# Patient Record
Sex: Female | Born: 1960 | Race: White | Hispanic: No | Marital: Married | Smoking: Former smoker
Health system: Southern US, Community
[De-identification: ages and names within clinical notes are randomized; demographics above are authoritative.]

## PROBLEM LIST (undated history)

## (undated) DIAGNOSIS — E119 Type 2 diabetes mellitus without complications: Secondary | ICD-10-CM

## (undated) DIAGNOSIS — M1990 Unspecified osteoarthritis, unspecified site: Secondary | ICD-10-CM

## (undated) HISTORY — PX: CHOLECYSTECTOMY: SHX55

## (undated) HISTORY — PX: ABDOMINAL HYSTERECTOMY: SHX81

---

## 1997-08-16 ENCOUNTER — Emergency Department (HOSPITAL_COMMUNITY): Admission: EM | Admit: 1997-08-16 | Discharge: 1997-08-16 | Payer: Self-pay | Admitting: Emergency Medicine

## 1998-04-20 ENCOUNTER — Other Ambulatory Visit: Admission: RE | Admit: 1998-04-20 | Discharge: 1998-04-20 | Payer: Self-pay | Admitting: *Deleted

## 1999-01-11 ENCOUNTER — Encounter: Payer: Self-pay | Admitting: Urology

## 1999-01-14 ENCOUNTER — Observation Stay (HOSPITAL_COMMUNITY): Admission: RE | Admit: 1999-01-14 | Discharge: 1999-01-15 | Payer: Self-pay | Admitting: Urology

## 1999-10-18 ENCOUNTER — Encounter: Payer: Self-pay | Admitting: Family Medicine

## 1999-10-18 ENCOUNTER — Encounter: Admission: RE | Admit: 1999-10-18 | Discharge: 1999-10-18 | Payer: Self-pay | Admitting: Gastroenterology

## 1999-10-18 ENCOUNTER — Encounter: Payer: Self-pay | Admitting: Gastroenterology

## 1999-10-18 ENCOUNTER — Encounter: Admission: RE | Admit: 1999-10-18 | Discharge: 1999-10-18 | Payer: Self-pay | Admitting: Family Medicine

## 1999-10-27 ENCOUNTER — Ambulatory Visit (HOSPITAL_COMMUNITY): Admission: RE | Admit: 1999-10-27 | Discharge: 1999-10-27 | Payer: Self-pay | Admitting: Gastroenterology

## 1999-11-22 ENCOUNTER — Encounter: Payer: Self-pay | Admitting: Gastroenterology

## 1999-11-22 ENCOUNTER — Ambulatory Visit (HOSPITAL_COMMUNITY): Admission: RE | Admit: 1999-11-22 | Discharge: 1999-11-22 | Payer: Self-pay | Admitting: Gastroenterology

## 2001-01-08 ENCOUNTER — Encounter: Payer: Self-pay | Admitting: Family Medicine

## 2001-01-08 ENCOUNTER — Encounter: Admission: RE | Admit: 2001-01-08 | Discharge: 2001-01-08 | Payer: Self-pay | Admitting: Family Medicine

## 2001-04-30 ENCOUNTER — Encounter: Admission: RE | Admit: 2001-04-30 | Discharge: 2001-04-30 | Payer: Self-pay | Admitting: Orthopedic Surgery

## 2001-04-30 ENCOUNTER — Encounter: Payer: Self-pay | Admitting: Orthopedic Surgery

## 2001-07-31 ENCOUNTER — Encounter (INDEPENDENT_AMBULATORY_CARE_PROVIDER_SITE_OTHER): Payer: Self-pay | Admitting: Specialist

## 2001-07-31 ENCOUNTER — Observation Stay (HOSPITAL_COMMUNITY): Admission: RE | Admit: 2001-07-31 | Discharge: 2001-08-01 | Payer: Self-pay | Admitting: Surgery

## 2001-07-31 ENCOUNTER — Encounter: Payer: Self-pay | Admitting: Surgery

## 2001-11-25 ENCOUNTER — Ambulatory Visit (HOSPITAL_BASED_OUTPATIENT_CLINIC_OR_DEPARTMENT_OTHER): Admission: RE | Admit: 2001-11-25 | Discharge: 2001-11-25 | Payer: Self-pay | Admitting: Family Medicine

## 2002-02-11 ENCOUNTER — Encounter: Admission: RE | Admit: 2002-02-11 | Discharge: 2002-02-11 | Payer: Self-pay | Admitting: Orthopedic Surgery

## 2002-02-11 ENCOUNTER — Encounter: Payer: Self-pay | Admitting: Orthopedic Surgery

## 2002-03-04 ENCOUNTER — Encounter: Payer: Self-pay | Admitting: Family Medicine

## 2002-03-04 ENCOUNTER — Encounter: Admission: RE | Admit: 2002-03-04 | Discharge: 2002-03-04 | Payer: Self-pay | Admitting: Family Medicine

## 2002-07-19 ENCOUNTER — Encounter: Payer: Self-pay | Admitting: Emergency Medicine

## 2002-07-19 ENCOUNTER — Emergency Department (HOSPITAL_COMMUNITY): Admission: EM | Admit: 2002-07-19 | Discharge: 2002-07-19 | Payer: Self-pay | Admitting: Emergency Medicine

## 2004-06-14 ENCOUNTER — Other Ambulatory Visit: Admission: RE | Admit: 2004-06-14 | Discharge: 2004-06-14 | Payer: Self-pay | Admitting: Gynecology

## 2005-08-02 ENCOUNTER — Encounter: Admission: RE | Admit: 2005-08-02 | Discharge: 2005-08-02 | Payer: Self-pay | Admitting: Family Medicine

## 2006-01-11 ENCOUNTER — Encounter (INDEPENDENT_AMBULATORY_CARE_PROVIDER_SITE_OTHER): Payer: Self-pay | Admitting: Specialist

## 2006-01-11 ENCOUNTER — Ambulatory Visit (HOSPITAL_COMMUNITY): Admission: RE | Admit: 2006-01-11 | Discharge: 2006-01-11 | Payer: Self-pay | Admitting: Gastroenterology

## 2006-05-09 ENCOUNTER — Other Ambulatory Visit: Admission: RE | Admit: 2006-05-09 | Discharge: 2006-05-09 | Payer: Self-pay | Admitting: Obstetrics and Gynecology

## 2007-03-19 ENCOUNTER — Encounter: Admission: RE | Admit: 2007-03-19 | Discharge: 2007-03-19 | Payer: Self-pay | Admitting: Obstetrics and Gynecology

## 2007-09-04 ENCOUNTER — Other Ambulatory Visit: Admission: RE | Admit: 2007-09-04 | Discharge: 2007-09-04 | Payer: Self-pay | Admitting: Obstetrics and Gynecology

## 2008-04-30 ENCOUNTER — Encounter: Admission: RE | Admit: 2008-04-30 | Discharge: 2008-04-30 | Payer: Self-pay | Admitting: Obstetrics and Gynecology

## 2008-08-06 ENCOUNTER — Encounter: Admission: RE | Admit: 2008-08-06 | Discharge: 2008-08-06 | Payer: Self-pay | Admitting: Family Medicine

## 2010-09-17 NOTE — Op Note (Signed)
[facility]  Patient:    MINEOLA, DUAN Visit Number: [id number] MRN: [id number]          Service Type: Attending:  Abigail Miyamoto, M.D. Dictated by:   Abigail Miyamoto, M.D. Proc. Date: 07/31/01                             Operative Report  PREOPERATIVE DIAGNOSIS:  Abdominal pain, biliary dyskinesia.  POSTOPERATIVE DIAGNOSIS:  Abdominal pain, biliary dyskinesia.  OPERATION PERFORMED:  Laparoscopic cholecystectomy.  ASSISTANT:  Adolph Pollack, M.D.  SURGEON:  Abigail Miyamoto, M.D.  ANESTHESIA:  General endotracheal anesthesia.  ESTIMATED BLOOD LOSS:  Minimal.  DESCRIPTION OF PROCEDURE:  Patient brought to operating room and identified as Vonna Kotyk.  She was placed supine on the operating table and general anesthesia was induced.  Next, Dr. Annabell Howells performed his pelvic procedure.  The patient was then placed in supine position.  Her abdomen was then prepped and draped in the usual sterile fashion.  Using a #15 blade, a small transverse incision was made below the umbilicus.  Incision was carried down to the fascia which was then opened with a scalpel.  A hemostat was then used to pass into the peritoneal cavity.  A 0 Vicryl pursestring suture was then placed around the fascial opening.  The Hasson port was placed through the opening and insufflation of the abdomen was begun.  An 11 mm port was placed in the patients epigastrium under direct vision.  The patient was found to have several adhesive bands from the anterior surface of the liver to the abdominal wall.  These were taken down with the scissors.  Next two 5 mm ports were placed in the patients right flank under direct vision.  The gallbladder was then identified, grasped and retracted above the liver bed.  Dissection was then carried out at the base of the gallbladder.  The cystic duct was dissected out and clipped three times proximally and twice distally and transected with the  scissors.  The cystic artery was then identified and clipped twice proximally, once distally and transected as well.  The gallbladder was then dissected free from the liver bed with the electrocautery.  The gallbladder was entered during the dissection and bile leaked from the gallbladder itself.  Once this was completely excised from the liver bed it was placed in an endosac.  Hemostasis was easily achieved in the liver bed.  The gallbladder was then removed through the port at the umbilicus.  A 0 Vicryl at the umbilicus was then tied in place closing the fascial defect.  The abdomen was then copiously irrigated with normal saline. Hemostasis was achieved.  All ports were then removed under direct vision. The abdomen was deflated.  All incisions were then anesthetized with 0.25% Marcaine and then closed with 4-0 Monocryl subcuticular sutures. Steri-Strips, gauze and tape were then applied.  The patient tolerated the procedure well.  All sponge, needle and instrument counts were correct at the end of the procedure.  The patient was then extubated in the operating room and taken in stable condition to the recovery room. Dictated by:   Abigail Miyamoto, M.D. Attending:  Abigail Miyamoto, M.D. DD:  07/31/01 TD:  08/01/01 Job: [id number]

## 2010-09-17 NOTE — Procedures (Signed)
[place]  Patient:    Alejandra [name], Alejandra [name]                      MRN: [id number] Proc. Date: 10/27/99 Adm. Date:  [id number] Attending:  Charna Elizabeth CC:         Lupe Carney, M.D.                           Procedure Report  DATE OF BIRTH:  [birth year]  REFERRING PHYSICIAN:  Lupe Carney, M.D.  PROCEDURE PERFORMED:  Colonoscopy.  ENDOSCOPIST:  Anselmo Rod, M.D.  INSTRUMENT USED:  Olympus video colonoscope.  INDICATIONS:  A 50 year old white female with guaiac positive stool.  Rule out colonic polyps, masses, hemorrhoids, etc.  PREPROCEDURE PREPARATION:  Informed consent was procured from the patient. The patient was fasted for 8 hours prior to the procedure and prepped with a bottle of magnesium citrate and a gallon of NuLytely the night prior to the procedure.  PREPROCEDURE PHYSICAL:  Patient has stable vital signs.  NECK: Supple.  CHEST:  Clear to auscultation. S1, S2 regular.  ABDOMEN:  Soft with normal abdominal bowel sounds.  DESCRIPTION OF PROCEDURE:  The patient was placed in left lateral decubitus position.  No additional sedation was used.  Once the patient was adequately positioned, the Olympus video colonoscope was advanced from the rectum to the cecum without difficulty.  Except for small internal hemorrhoid, no other abnormalities were seen.  The procedure was completed up to the cecum.  The appendiceal orifice and the ileocecal valve were clearly visualized.  No masses, polyps, erosions, ulcerations or diverticula were present.  IMPRESSION:  Normal colonoscopy except for small, nonbleeding internal hemorrhoid.  RECOMMENDATIONS: 1. The patient has been advised to increase the fluid and fiber in her diet. 2. Repeat guaiac testing will be done on an outpatient basis and further    recommendations made as needed. 3. A high fiber diet will be discussed ______  on her next office visit and    she will be followed up  closely. DD:  10/27/99 TD:  10/28/99 Job: [id number]

## 2010-09-17 NOTE — Procedures (Signed)
[place]. [facility]  Patient:    Alejandra [name], Alejandra [name]                      MRN: [id number] Proc. Date: 10/27/99 Adm. Date:  [id number] Attending:  Charna Elizabeth CC:         Lupe Carney, M.D.                           Procedure Report  DATE OF BIRTH:  05/01/1961  REFERRING PHYSICIAN:  Lupe Carney, M.D.  PROCEDURE PERFORMED:  Esophagogastroduodenoscopy.  ENDOSCOPIST:  Anselmo Rod, M.D.  INSTRUMENT USED:  Olympus video panendoscope.  INDICATIONS:  Guaiac positive stool and epigastric pain in a 50 year old female.  Rule out peptic ulcer disease, esophagitis, gastritis, etc.  PREPROCEDURE PREPARATION:  Informed consent was procured from the patient. The patient was fasted for 8 hours prior to the procedure.  PREPROCEDURE PHYSICAL:  Patient has stable vital signs.  NECK:  Supple.  CHEST:  Clear to auscultation. S1, S2 regular.  ABDOMEN:  Soft with normal abdominal bowel sounds.  DESCRIPTION OF PROCEDURE:  The patient was placed in left lateral decubitus position and sedated with 80 mg of Demerol and 8 mg of Versed intravenously. Once the patient was adequately sedated and maintained on low-flow oxygen and continuous cardiac monitoring, the Olympus video panendoscope was advanced through the mouth piece, over the tongue into the esophagus under direct vision.  The entire esophagus appeared normal without evidence of rings, strictures, masses, lesions or esophagitis.  The scope was then advanced into the stomach.  The entire gastric mucosa and the proximal small bowel to 60 cm appeared normal.  There was no outlet obstruction.  IMPRESSION:  Normal esophagogastroduodenoscopy.  RECOMMENDATIONS: 1. Proceed with colonoscopy at this time. 2. Outpatient follow-up for possible HIDA scan with CCK injection. DD:  10/27/99 TD:  10/28/99 Job: [id number]

## 2010-09-17 NOTE — Op Note (Signed)
[facility]  Patient:    Alejandra [name], Alejandra [name]. Visit Number: [id number] MRN: [id number]          Service Type: Attending:  Excell Seltzer. Annabell Howells, M.D. Dictated by:   Excell Seltzer. Annabell Howells, M.D. Proc. Date: 07/31/01   CC:         Alejandra [name], M.D.  Alejandra [name], M.D. [facility]   Operative Report  PROCEDURE:  Removal of retained suprapubic stitch and SPARC sling.  PREOPERATIVE DIAGNOSES:  Recurrent stress incontinence and retained suprapubic stitch.  POSTOPERATIVE DIAGNOSES:  Recurrent stress incontinence and retained suprapubic stitch.  SURGEON:  Excell Seltzer. Annabell Howells, M.D.  ANESTHESIA:  General.  DRAINS:  Foley.  COMPLICATIONS:  None.  INDICATIONS FOR PROCEDURE:  Alejandra [name] is a 50 year old white female who is several years out from AP repair and sling. She has had some recurrent stress incontinence that has gotten progressively worse and has elected to undergo a SPARC sling for treatment of recurrent stress incontinence. She also has retained suprapubic stitch from her prior sling that is quite superficial and desires to have that removed. This is to be done in conjunction with a cholecystectomy by Dr. Magnus Ivan.  FINDINGS AND PROCEDURE:  The patient was given a gram of Ancef, she was taken to the operating room where a general anesthetic was induced. Her mons were shaved, she was placed in the lithotomy position. The perineum and genitalia were prepped with Betadine solution and then she was draped in the usual sterile fashion. A Foley catheter was inserted and the bladder was drained. The suprapubic area was palpated, the stitch was located in the midline, an incision was made approximately 2 cm in length over the stitch. The Bovie was then used to expose the suture material, it was grasped with an Allis and then drawn up until the knot was free of the subcutaneous tissue. The suture was then divided on each side and the entire suture material was removed  without difficulty. After removal of the suture material, two incisions were made 2 cm above the pubis and 2-3 cm lateral to the midline on each side. The fat was spread to the fascia and a sponge was placed over this area. I then placed a weighted vaginal retractor. The anterior vaginal wall was infiltrated with 5 cc of 1% lidocaine with epinephrine and a 2-3 cm midline vaginal incision was made over the mid urethral area which was determined by palpating the Foley balloon and locating in relation to that. Once the vaginal mucosa was incised, the edges were grasped with Allis and the mucosa was elevated off the pubourethral fascia for a distance of approximately 2 cm on each side. At this point, the [facility] trocars were brought onto the field, the right trocar was passed first, the tip of the trocar was passed through the abdominal incision on the right until the tip was on top of the pubis. It was then walked behind the pubis until it could be palpated with a finger in the right side of the vaginal incision. The finger was used to keep the urethra pulled medially away from the tip of the trocar. The trocar was then passed through the pubourethral fascia. This process was then repeated on the left side in an identical fashion. The Foley catheter was then removed and cystoscopy was performed using the 22 [place] scope and the 70 degree lens. The bladder was fully distended and the bladder wall was inspected thoroughly, no evidence of trocar injury to the bladder was  noted. At this point, the bladder was drained, the [facility] sling was snapped on each end of the trocar and was drawn into the abdominal incisions. Once in position, recystoscopy was performed once again no evidence of bladder wall injury was identified. Additionally, the urethra was inspected thoroughly and no abnormalities were noted. At this point, the sling was positioned appropriately, the trocars were trimmed from the ends of  the sling material and the sheath was grasped and removed first on the right side and then on the left using a right angle beneath the sling material to prevent over tensioning. Once the sheath was removed, the bladder which had been left full after this second cystoscopy was pressed upon and flow was noted from the urethra indicating appropriate tensioning. At this point, a Foley catheter was inserted, the bladder was drained, the balloon was filled with 10 cc of sterile fluid. The position of the sling was checked one more time with the right angle to ensure adequate spacing between the sling and the posterior urethral wall. The anterior vaginal wall was then closed with a running locked 2-0 Vicryl stitch. A 2 inch iodoform vaginal pack was placed and the Foley catheter was placed to straight drainage. At this point, the long ends of the sling material were trimmed on the abdominal side and allowed to retract back into the submucosal tissues and the three small abdominal incisions were closed using half inch Steri-Strips cut in half after prepping the skin with tinctured benzoin. At this point, the drapes were removed, the patient was taken down from lithotomy position and Dr. Magnus Ivan will proceed with the cholecystectomy. The Foley catheter was connected to straight drainage. There were no complications during my portion of the procedure.   Dictated by:   Excell Seltzer. Annabell Howells, M.D. Attending:  Excell Seltzer. Annabell Howells, M.D. DD:  07/31/01 TD:  08/01/01 Job: [id number]

## 2010-09-17 NOTE — Op Note (Signed)
[name], [name]               ACCOUNT NO.:  [id number]   MEDICAL RECORD NO.:  [id number]          PATIENT TYPE:  AMB   LOCATION:  ENDO                         FACILITY:  [facility]   PHYSICIAN:  Anselmo Rod, M.D.  DATE OF BIRTH:  [birth year]   DATE OF PROCEDURE:  01/11/2006  DATE OF DISCHARGE:                                 OPERATIVE REPORT   PROCEDURE PERFORMED:  Screening colonoscopy.   ENDOSCOPIST:  Anselmo Rod, M.D.   INSTRUMENT USED:  Olympus video colonoscope.   INDICATIONS FOR PROCEDURE:  A 50 year old white female with a history of  rectal bleeding and abdominal pain, rule out colonic polyps, masses, etc.   PROCEDURE PREPARATION:  Informed consent was procured from the patient and  the patient fasted for 4 hours prior to the procedure, after being prepared  with 20 OsmoPrep pills the night of, and 12 OsmoPrep pills the morning of  the procedure.  Risks and benefits of the procedure, including a 10% miss  rate of cancer and polyp, were discussed with the patient as well.   PREPROCEDURE PHYSICAL:  Patient had stable vital signs.  NECK:  Supple.  CHEST:  Clear to auscultation.  S1-S2 regular.  ABDOMEN:  Soft with normal bowel sounds.   DESCRIPTION OF PROCEDURE:  The patient was placed in the left lateral  decubitus position, sedated with an additional 90 mcg of fentanyl and 2.5 mg  of Versed in slow incremental doses.  Once the patient was adequately  sedated and maintained on low flow oxygen and continuous cardiac monitor,  the Olympus video colonoscope was advanced from the rectum to the cecum.  The appendiceal orifice and ileocecal valve were clearly visualized and  photographed.  The terminal ileum appeared healthy and without lesions.  Retroflexion in rectum revealed small internal hemorrhoids.  The patient  tolerated the procedure well without complications.  No masses, polyps,  ulcerations or diverticula were seen.  The patient had some abdominal  discomfort with insufflation of air into the colon, indicating a component  of visceral hypersensitivity, most consistent with IBS.   IMPRESSION:  1. Normal colonoscopy up to the terminal ileum except for small      nonbleeding internal hemorrhoid.  2. Questionable visceral hypersensitivity indicated by abdominal pain      experienced by the patient when air was insufflated into the colon,      consistent with irritable bowel syndrome.   RECOMMENDATIONS:  1. Continue high fiber diet with liberal fluid intake.  2. Repeat colonoscopy in the next 10 years unless the patient develops any      abnormal symptoms in interim.  3. Outpatient follow up in the next 2 weeks with further recommendations.      Anselmo Rod, M.D.  Electronically Signed     JNM/MEDQ  D:  01/11/2006  T:  01/11/2006  Job:  [id number]   cc:   L. Lupe Carney, M.D.

## 2010-09-17 NOTE — Op Note (Signed)
Alejandra [name], Alejandra [name]               ACCOUNT NO.:  [id number]   MEDICAL RECORD NO.:  [id number]          PATIENT TYPE:  AMB   LOCATION:  ENDO                         FACILITY:  [facility]   PHYSICIAN:  Anselmo Rod, M.D.  DATE OF BIRTH:  01/01/1961   DATE OF PROCEDURE:  01/11/2006  DATE OF DISCHARGE:                                 OPERATIVE REPORT   PROCEDURE:  Esophagogastroduodenoscopy with multiple antral biopsies.   ENDOSCOPIST:  Anselmo Rod, M.D.   INSTRUMENT USED:  Olympus video panendoscope.   INDICATIONS FOR PROCEDURE:  50 year old white female with a history of  epigastric pain, occasional black stools, reflux, and dysphagia, undergoing  EGD to rule out peptic ulcer disease, esophagitis, gastritis, etc.   PREPROCEDURE PREPARATION:  Informed consent was obtained from the patient.  The patient was fasted for four hours prior to the procedure.  The risks and  benefits of the procedure were discussed with her in great detail.   PREPROCEDURE PHYSICAL:  Patient with stable vital signs.  Neck supple.  Chest clear to auscultation.  S1 and S2 regular.  Abdomen soft with normal  bowel sounds.   DESCRIPTION OF PROCEDURE:  The patient was placed in the left lateral  decubitus position, sedated with 50 mcg of Fentanyl and 10 mg Versed in slow  incremental doses.  Once the patient was adequately sedated, maintained on  low flow oxygen and continuous cardiac monitoring, the Olympus video  panendoscope was advanced through the mouth piece over the tongue into the  esophagus under direct vision.  The entire esophagus appeared normal with no  evidence of ring, strictures, masses, esophagitis, or Barrett's mucosa.  The  scope was then advanced into the stomach.  A small hiatal hernia was seen on  high retroflexion.  There was diffuse gastritis noted with antral erosion.  Antral biopsies were done to rule out the presence of H. pylori by  pathology. The proximal small bowel appeared  normal.  There was mild  duodenitis in the duodenal bulb.  The small bowel up to 60 cm appeared  normal.  There was no obvious obstruction.  The patient tolerated the  procedure well without immediate complications.   IMPRESSION:  1. Normal appearing esophagus.  2. Small hiatal hernia.  3. Diffuse gastritis.  4. Antral erosions, biopsies done for H. pylori.  5. Mild duodenitis in duodenal bulb.  6. Normal small bowel to 60 cm.   RECOMMENDATIONS:  1. Await pathology results.  2. Continue PPIs.  3. Treat with antibiotics if H. pylori present on biopsy.  4. Proceed with a colonoscopy at this time.  5. Outpatient follow up in the next two weeks for further recommendations.      Anselmo Rod, M.D.  Electronically Signed     JNM/MEDQ  D:  01/11/2006  T:  01/11/2006  Job:  [id number]   cc:   L. Lupe Carney, M.D.

## 2010-12-08 ENCOUNTER — Other Ambulatory Visit: Payer: Self-pay | Admitting: Obstetrics and Gynecology

## 2010-12-08 ENCOUNTER — Other Ambulatory Visit (HOSPITAL_COMMUNITY)
Admission: RE | Admit: 2010-12-08 | Discharge: 2010-12-08 | Disposition: A | Discharge status: Home / Self Care | Payer: Managed Care, Other (non HMO) | Source: Ambulatory Visit | Attending: Obstetrics and Gynecology | Admitting: Obstetrics and Gynecology

## 2010-12-08 DIAGNOSIS — Z01419 Encounter for gynecological examination (general) (routine) without abnormal findings: Secondary | ICD-10-CM | POA: Insufficient documentation

## 2012-01-08 ENCOUNTER — Emergency Department (HOSPITAL_COMMUNITY): Payer: Managed Care, Other (non HMO)

## 2012-01-08 ENCOUNTER — Emergency Department (HOSPITAL_COMMUNITY)
Admission: EM | Admit: 2012-01-08 | Discharge: 2012-01-08 | Disposition: A | Discharge status: Home / Self Care | Payer: Managed Care, Other (non HMO) | Attending: Emergency Medicine | Admitting: Emergency Medicine

## 2012-01-08 ENCOUNTER — Encounter (HOSPITAL_COMMUNITY): Payer: Self-pay | Admitting: Emergency Medicine

## 2012-01-08 DIAGNOSIS — M25519 Pain in unspecified shoulder: Secondary | ICD-10-CM | POA: Insufficient documentation

## 2012-01-08 DIAGNOSIS — M7061 Trochanteric bursitis, right hip: Secondary | ICD-10-CM

## 2012-01-08 DIAGNOSIS — G8929 Other chronic pain: Secondary | ICD-10-CM

## 2012-01-08 DIAGNOSIS — M1990 Unspecified osteoarthritis, unspecified site: Secondary | ICD-10-CM

## 2012-01-08 DIAGNOSIS — K7689 Other specified diseases of liver: Secondary | ICD-10-CM | POA: Insufficient documentation

## 2012-01-08 DIAGNOSIS — M549 Dorsalgia, unspecified: Secondary | ICD-10-CM | POA: Insufficient documentation

## 2012-01-08 DIAGNOSIS — M5137 Other intervertebral disc degeneration, lumbosacral region: Secondary | ICD-10-CM | POA: Insufficient documentation

## 2012-01-08 DIAGNOSIS — M76899 Other specified enthesopathies of unspecified lower limb, excluding foot: Secondary | ICD-10-CM | POA: Insufficient documentation

## 2012-01-08 DIAGNOSIS — M129 Arthropathy, unspecified: Secondary | ICD-10-CM | POA: Insufficient documentation

## 2012-01-08 DIAGNOSIS — M51379 Other intervertebral disc degeneration, lumbosacral region without mention of lumbar back pain or lower extremity pain: Secondary | ICD-10-CM | POA: Insufficient documentation

## 2012-01-08 DIAGNOSIS — N390 Urinary tract infection, site not specified: Secondary | ICD-10-CM

## 2012-01-08 HISTORY — DX: Unspecified osteoarthritis, unspecified site: M19.90

## 2012-01-08 LAB — BASIC METABOLIC PANEL
Chloride: 96 mEq/L (ref 96–112)
Creatinine, Ser: 0.46 mg/dL — ABNORMAL LOW (ref 0.50–1.10)
GFR calc non Af Amer: >90 mL/min (ref >90)
Sodium: 135 mEq/L (ref 135–145)

## 2012-01-08 LAB — URINALYSIS, ROUTINE W REFLEX MICROSCOPIC
Bilirubin Urine: NEGATIVE
Glucose, UA: NEGATIVE mg/dL
Hgb urine dipstick: NEGATIVE
Ketones, ur: NEGATIVE mg/dL
Nitrite: NEGATIVE
Protein, ur: NEGATIVE mg/dL
Urobilinogen, UA: 0.2 mg/dL (ref 0.0–1.0)

## 2012-01-08 LAB — CBC
Hemoglobin: 15.9 g/dL — ABNORMAL HIGH (ref 12.0–15.0)
MCHC: 35.4 g/dL (ref 30.0–36.0)
MCV: 90.5 fL (ref 78.0–100.0)
RDW: 12.9 % (ref 11.5–15.5)

## 2012-01-08 MED ORDER — OXYCODONE-ACETAMINOPHEN 5-325 MG PO TABS: 2.0000 | ORAL_TABLET | ORAL | Status: AC | PRN

## 2012-01-08 MED ORDER — KETOROLAC TROMETHAMINE 30 MG/ML IJ SOLN
30.0000 mg | Freq: Once | INTRAMUSCULAR | Status: AC
  Administered 2012-01-08: 30 mg via INTRAVENOUS
  Filled 2012-01-08: qty 1

## 2012-01-08 MED ORDER — FENTANYL CITRATE 0.05 MG/ML IJ SOLN
100.0000 ug | Freq: Once | INTRAMUSCULAR | Status: AC
  Administered 2012-01-08: 100 ug via INTRAVENOUS
  Filled 2012-01-08: qty 2

## 2012-01-08 MED ORDER — ONDANSETRON HCL 4 MG/2ML IJ SOLN
4.0000 mg | Freq: Once | INTRAMUSCULAR | Status: AC
  Administered 2012-01-08: 4 mg via INTRAVENOUS
  Filled 2012-01-08: qty 2

## 2012-01-08 MED ORDER — IBUPROFEN 800 MG PO TABS: 800.0000 mg | ORAL_TABLET | Freq: Three times a day (TID) | ORAL | Status: AC

## 2012-01-08 NOTE — ED Provider Notes (Signed)
History     CSN: [id number]  Arrival date & time 01/08/12  0751   First MD Initiated Contact with Patient 01/08/12 0802      Chief Complaint  Patient presents with  . Hip Pain    (Consider location/radiation/quality/duration/timing/severity/associated sxs/prior treatment) HPI Alejandra [name] is a 51 y.o. female presenting with acute right hip pain on the right lateral aspect. Pain is currently 10/10 it has been there for 2 days, it is been constant, and is unrelieved and 27.5-325 mg Percocet tablets, and radiating somewhat down to the right knee. The patient says this is different than her chronic back pain.  Patient has a pertinent past medical history of incontinence and has had 3 separate slings placed/replaced by her OB/GYN. She saw her OB/GYN for lower abdominal pain on August 21, And was referred to her primary care physician for all polyarthralgias. Her primary care physician has worked her up for rheumatologic problems, the patient notes that so far that workup has been negative. She complains about problems in her shoulders hips and knees, these are chronic, they typically are worse at the end of the day and gets somewhat better with rest. She has had also long-standing low back pain, she says this was following a myelogram she had years ago. Patient denies any rash, fevers, chills, night sweats, or recent weight changes. Patient does admit to some recent dysuria and frequency over the last 24 hours.  Past Medical History  Diagnosis Date  . Arthritis     History reviewed. No pertinent past surgical history.  No family history on file.  History  Substance Use Topics  . Smoking status: Not on file  . Smokeless tobacco: Not on file  . Alcohol Use: No    OB History    Grav Para Term Preterm Abortions TAB SAB Ect Mult Living                  Review of Systems Positive for right hip pain, arthritis, dysuria, frequency, low back pain, patient also says she has some low  abdominal pain as well.; patient denies any fevers or chills, changes in vision, earache, sore throat, neck pain or stiffness, chest pain or pressure, palpitations, syncope, dyspnea, cough, wheezing, nausea, vomiting, diarrhea, melena, red bloody stools, frequency, dysuria, myalgias, recent trauma, rash, itching, skin lesions, easy bruising or bleeding, headache, seizures, numbness, tingling or weakness.   Allergies  Review of patient's allergies indicates not on file.  Home Medications  No current outpatient prescriptions on file.  BP 123/81  Pulse 92  Temp 98.1 F (36.7 C) (Oral)  Resp 22  SpO2 100%  Physical Exam VITAL SIGNS:   Filed Vitals:   01/08/12 1237  BP: 98/62  Pulse: 79  Temp:   Resp: 18   CONSTITUTIONAL: Awake, oriented, appears non-toxic HENT: Atraumatic, normocephalic, oral mucosa pink and moist, airway patent. Nares patent without drainage. External ears normal. EYES: Conjunctiva clear, EOMI, PERRLA NECK: Trachea midline, non-tender, supple CARDIOVASCULAR: Normal heart rate, Normal rhythm, No murmurs, rubs, gallops PULMONARY/CHEST: Clear to auscultation, no rhonchi, wheezes, or rales. Symmetrical breath sounds. Non-tender. ABDOMINAL: Obese, non-distended, soft, non-tender - no rebound or guarding.  BS normal. NEUROLOGIC: Non-focal, moving all four extremities, no gross sensory or motor deficits. EXTREMITIES: No clubbing, cyanosis, or edema. Patient has tenderness over the right greater trochanter. Able to put her right leg through passive range of motion until he due to the extreme of hip flexion where she has some pain. SKIN: Warm,  Dry, No erythema, No rash  ED Course  Procedures (including critical care time)  Labs Reviewed  CBC - Abnormal; Notable for the following:    WBC 12.5 (*)     Hemoglobin 15.9 (*)     All other components within normal limits  URINALYSIS, ROUTINE W REFLEX MICROSCOPIC - Abnormal; Notable for the following:    APPearance CLOUDY  (*)     Leukocytes, UA MODERATE (*)     All other components within normal limits  BASIC METABOLIC PANEL - Abnormal; Notable for the following:    Glucose, Bld 100 (*)     Creatinine, Ser 0.46 (*)     All other components within normal limits  URINE MICROSCOPIC-ADD ON - Abnormal; Notable for the following:    Squamous Epithelial / LPF FEW (*)     Bacteria, UA FEW (*)     All other components within normal limits  LAB REPORT - SCANNED   Ct Abdomen Pelvis Wo Contrast  01/08/2012  *RADIOLOGY REPORT*  Clinical Data:  Severe right hip pain, lower abdominal pain and back pain  CT ABDOMEN AND PELVIS WITHOUT CONTRAST  Technique:  Multidetector CT imaging of the abdomen and pelvis was performed following the standard protocol without intravenous contrast.  Comparison: Concurrently obtained dedicated CT scan of the right hip  Findings:  Lower Chest:  Visualized cardiac structures are within normal limits for size.  No pericardial effusion.  There is a small hiatal hernia.  The lung bases are clear.  There is trace atelectasis versus scarring in the inferior lingula.  Abdomen: Evaluation of the abdominal organs is limited in the absence of intravenous contrast material.  Given the limitations, the imaging appearance of the stomach, duodenum, spleen, pancreas, adrenal glands, and kidneys is within normal limits. Hypoattenuation of the hepatic parenchyma is consistent with fatty infiltration.  The gallbladder is surgically absent.  There is no significant biliary ductal dilatation.  Normal-caliber large and small bowel throughout the abdomen.  No significant colonic diverticular disease although the appendix is not definitively identified, there is no inflammatory change in the right lower quadrant to suggest appendicitis.  No ascites, or suspicious adenopathy.  There is a small fat containing umbilical hernia.  Pelvis: Surgical changes of hysterectomy and right tubal ligation. A left ligation clip is not  identified.  Normal appearance of the bladder.  No free fluid, or suspicious adenopathy.  Bones: No acute fracture or aggressive appearing lytic or blastic osseous lesion. Lower lumbar facet arthropathy at L4-L5 and L5-S1. No significant degenerative disc disease. Fluid is noted in the region of the right trochanteric bursa along with some curvilinear soft tissue ossification.  Please see dedicated CT scan of the right hip for additional findings.  Vascular: Limited in the absence of intravenous contrast material. There are atherosclerotic vascular calcifications along the abdominal aorta.  No aneurysmal dilatation.  IMPRESSION:  1.  No acute intra-abdominal, or intrapelvic findings to explain the patient's clinical symptoms. 2.  Probable right trochanteric bursitis.  Please see dedicated, and separately dictated CT scan of the right hip for possible additional findings. 3.  Atherosclerotic vascular calcifications 4.  Hepatic steatosis   Original Report Authenticated By: Alvino Blood Hip Complete Right  01/08/2012  *RADIOLOGY REPORT*  Clinical Data: Right-sided hip pain.  RIGHT HIP - COMPLETE 2+ VIEW  Comparison: No priors.  Findings: AP view of the pelvis and AP and lateral views of the right hip demonstrate no acute fracture, subluxation, dislocation, joint or  soft tissue abnormality.  A small amount of dystrophic calcification is noted adjacent to the greater trochanter. Surgical clip in the right hemi pelvis.  Visualized bowel gas pattern is unremarkable.  IMPRESSION: 1.  No acute radiographic abnormality of the bony pelvis or the right hip.   Original Report Authenticated By: Florencia Reasons, M.D.    Ct Lumbar Spine Wo Contrast  01/08/2012  *RADIOLOGY REPORT*  Clinical Data: 51 year old female severe pain unable to walk. History of arthritis.  CT LUMBAR SPINE WITHOUT CONTRAST  Technique:  Multidetector CT imaging of the lumbar spine was performed without intravenous contrast administration. Multiplanar CT  image reconstructions were also generated.  Comparison: None.  Findings: Visualized costophrenic sulci are clear.  Negative visualized noncontrast abdominal viscera except for previous cholecystectomy and calcified atherosclerosis of the aorta.  Normal lumbar segmentation.  Vertebral height and alignment normal to the T10 level. Bone mineralization is within normal limits.  SI joints within normal limits.  Visualized sacrum intact.  T9-T10:  Negative.  T10-T11:  Mild facet hypertrophy, otherwise negative.  T11-T12: Negative.  T12-L1:  Negative.  L1-L2:  Mild facet hypertrophy.  Negative disc and no stenosis.  L2-L3:  Mild to moderate facet hypertrophy.  Minimal disc bulge. No stenosis.  L3-L4:  Mild to moderate facet hypertrophy.  Minimal to mild circumferential disc bulge.  No stenosis.  L4-L5:  Severe facet degeneration and hypertrophy.  Vacuum facet phenomenon on the left.  Mild circumferential disc bulge.  No significant spinal stenosis.  No definite lateral recess stenosis. Mild if any bilateral L4 foraminal stenosis.  L5-S1:  Severe facet degeneration and hypertrophy on the right. Marked bony overgrowth.  Trace vacuum phenomena. Mild to moderate facet hypertrophy on the left.  Negative disc.  No spinal stenosis. Mild if any lateral recess stenosis.  Mild right greater than left L5 foraminal stenosis.  IMPRESSION: 1.  No acute osseous abnormality and no lumbar spinal stenosis. 2.  Very severe lumbar facet degeneration at L4-L5 (greater on the left) and L5-S1 (greater on the right). This is a possible source for low back pain, and facet related pain can sometimes radiate, but only mild stenosis is associated as above.   Original Report Authenticated By: Harley Hallmark, M.D.    Ct Hip Right Wo Contrast  01/08/2012  *RADIOLOGY REPORT*  Clinical Data: Severe right hip pain.  No known injury.  CT OF THE RIGHT HIP WITHOUT CONTRAST  Technique:  Multidetector CT imaging was performed according to the standard  protocol. Multiplanar CT image reconstructions were also generated.  Comparison: CT scan 01/18/2012.  Findings: The right hip is normally located.  Minimal degenerative changes.  No findings for acute/occult fracture.  No evidence of avascular necrosis.  No definite hip joint effusion.  The visualized pubic rami are intact.  There is fairly significant trochanteric bursitis with a small calcifications suggesting calcific bursitis.  Probable associated gluteus medius tendonitis.  The visualized intrapelvic structures appear normal.  No inguinal mass or hernia.  IMPRESSION:  1.  No acute bony findings and no evidence for avascular necrosis. 2.  Significant trochanteric bursitis with areas of calcification.   Original Report Authenticated By: P. Loralie Champagne, M.D.      1. Trochanteric bursitis of right hip   2. Chronic back pain   3. Arthritis   4. UTI (lower urinary tract infection)       MDM  Alejandra [name] is a 51 y.o. female presents with right hip pain on the right lateral  side. Pain could likely be from a trochanteric bursitis however differential diagnosis would include occult fracture stress fracture, any kind of metastatic disease, muscle tear or referred pain.  Patient does have a history of low back pain so that also be a stenotic lesion as well. Patient's neuro exam is otherwise normal. She has had a history of multiple bladder signs. Patient says she is currently incontinent. She says her incontinence has been getting worse even know she's had a bladder slings  She first noticed this incontinence when she started having low back pain.  Consider my differential diagnosis toxic cerebritis versus septic arthritis. The patient looks nontoxic and she has not been walking on the right hip due to the pain, I do not think she necessarily has a septic hip contains CBC is some imaging studies to evaluate further prior to invasive arthrocentesis.  Will pursue aggressive imaging due to patient clinical  presentation - pain seems out of proportion to exam and trochanteric bursitis, however septic arthritis is lower on my DDx.  WBC is mildly elevated, non-specific. UA is contaminated, not consistent with UTI.  Imaging shows chronic changes, no fractures, trochanteric bursitis.  No acute intra-abdominal or pelvic pathology.  Will treat the patient conservatively for bursitis and refer her back to her PCP.  She may also see her Orthopedist.  She'll need to use antiinflammatories, heat and stretching exercises initially.  She may need this bursa injected - I told her this may be a therapy with some benefit if conservative therapy fails.  She is to rest the area, toe-touch only to the right leg using a cane or crutches (patient has both.)  I explained the diagnosis and have given explicit precautions to return to the ER including fever, chills, nausea/vomiting, worsening pain or any other new or worsening symptoms. The patient understands and accepts the medical plan as it's been dictated and I have answered their questions. Discharge instructions concerning home care and prescriptions have been given.  The patient is STABLE and is discharged to home in good condition.        Jones Skene, MD 01/09/12 1933

## 2012-01-08 NOTE — ED Notes (Signed)
Pt. Stated, I can't walk I'm having pain in my hip so bad i can't even dress myself.  i have pain in my legs, just all over

## 2013-04-26 ENCOUNTER — Other Ambulatory Visit: Payer: Self-pay | Admitting: Physician Assistant

## 2013-04-26 ENCOUNTER — Ambulatory Visit
Admission: RE | Admit: 2013-04-26 | Discharge: 2013-04-26 | Disposition: A | Discharge status: Home / Self Care | Payer: BC Managed Care – PPO | Source: Ambulatory Visit | Attending: Physician Assistant | Admitting: Physician Assistant

## 2013-04-26 DIAGNOSIS — R52 Pain, unspecified: Secondary | ICD-10-CM

## 2014-10-30 ENCOUNTER — Other Ambulatory Visit: Payer: Self-pay | Admitting: Orthopedic Surgery

## 2014-10-30 DIAGNOSIS — M25512 Pain in left shoulder: Secondary | ICD-10-CM

## 2014-11-18 ENCOUNTER — Ambulatory Visit
Admission: RE | Admit: 2014-11-18 | Discharge: 2014-11-18 | Disposition: A | Discharge status: Home / Self Care | Payer: BLUE CROSS/BLUE SHIELD | Source: Ambulatory Visit | Attending: Orthopedic Surgery | Admitting: Orthopedic Surgery

## 2014-11-18 DIAGNOSIS — M25512 Pain in left shoulder: Secondary | ICD-10-CM

## 2014-11-18 MED ORDER — IOHEXOL 180 MG/ML  SOLN
15.0000 mL | Freq: Once | INTRAMUSCULAR | Status: AC | PRN
  Administered 2014-11-18: 15 mL via INTRA_ARTICULAR

## 2016-04-01 ENCOUNTER — Telehealth (INDEPENDENT_AMBULATORY_CARE_PROVIDER_SITE_OTHER): Payer: Self-pay | Admitting: Orthopedic Surgery

## 2016-04-01 NOTE — Telephone Encounter (Signed)
Patient wanting to know if she can have another injection in shoulder by Dr. August Saucerean that he did when he used the scope to see in her shoulder. Requesting to have it done before the end of the year. Please give pt a call.

## 2016-04-01 NOTE — Telephone Encounter (Signed)
See note from pt, also old SRS note, pls advise on another US injection AC joint?    PATIENT: Alejandra [name], Alejandra [name]   MR#: [id number]  DOB: Aug 30, 1960   Visit Date: 11/24/2014     HISTORY OF PRESENT ILLNESS:  Alejandra [name], a 55 year old patient, left shoulder pain.  [facility], which was planned.  That is performed today.   PROCEDURE:  The patient's clinical condition is marked by substantial pain and/or significant functional disability. Other conservative therapy has not provided relief, is contraindicated, or not appropriate. There is a reasonable likelihood that injection will significantly improve the patient's pain and/or functional impairment.  Injected 0.33 mL Depo-Medrol and 0.66 mL of Marcaine [facility].  Tolerated well.    PLAN:  We will see her back in several weeks for clinical recheck.     For additional information please see handwritten notes, reports, orders and prescriptions in this chart.      Burnard BuntingG. Scott Dean, M.D.    Auto-Authenticated by G. Dorene GrebeScott Dean, M.D.  GSD/sw DD: 12/01/2014  DT: 12/03/2014

## 2016-04-04 NOTE — Telephone Encounter (Signed)
Okay to set her up for this please call thanks

## 2016-04-05 NOTE — Telephone Encounter (Signed)
Can you call pt and schedule an appt with Dr August Saucerean, next available, for US Injection [facility]?  Thanks so much!

## 2016-04-06 NOTE — Telephone Encounter (Signed)
Ok, patient is scheduled for 4:15 on Wednesday Dec 16th w/Dean for [facility]

## 2016-04-13 ENCOUNTER — Encounter (INDEPENDENT_AMBULATORY_CARE_PROVIDER_SITE_OTHER): Payer: Self-pay | Admitting: Orthopedic Surgery

## 2016-04-13 ENCOUNTER — Encounter (INDEPENDENT_AMBULATORY_CARE_PROVIDER_SITE_OTHER): Payer: Self-pay

## 2016-04-13 ENCOUNTER — Ambulatory Visit (INDEPENDENT_AMBULATORY_CARE_PROVIDER_SITE_OTHER): Payer: BLUE CROSS/BLUE SHIELD | Admitting: Orthopedic Surgery

## 2016-04-13 DIAGNOSIS — M19012 Primary osteoarthritis, left shoulder: Secondary | ICD-10-CM | POA: Diagnosis not present

## 2016-04-13 MED ORDER — HYDROCODONE-ACETAMINOPHEN 5-325 MG PO TABS: 1.0000 | ORAL_TABLET | Freq: Four times a day (QID) | ORAL | 0 refills | Status: DC | PRN

## 2016-04-13 MED ORDER — DIAZEPAM 5 MG PO TABS: 5.0000 mg | ORAL_TABLET | ORAL | 0 refills | Status: DC

## 2016-04-15 DIAGNOSIS — M19012 Primary osteoarthritis, left shoulder: Secondary | ICD-10-CM | POA: Diagnosis not present

## 2016-04-15 MED ORDER — LIDOCAINE HCL 1 % IJ SOLN
3.0000 mL | INTRAMUSCULAR | Status: AC | PRN
  Administered 2016-04-15: 3 mL

## 2016-04-15 MED ORDER — BUPIVACAINE HCL 0.25 % IJ SOLN
0.6600 mL | INTRAMUSCULAR | Status: AC | PRN
  Administered 2016-04-15: .66 mL via INTRA_ARTICULAR

## 2016-04-15 MED ORDER — TRIAMCINOLONE ACETONIDE 40 MG/ML IJ SUSP
20.0000 mg | INTRAMUSCULAR | Status: AC | PRN
  Administered 2016-04-15: 20 mg via INTRA_ARTICULAR

## 2016-04-15 NOTE — Progress Notes (Signed)
Office Visit Note   Patient: Alejandra [name]           Date of Birth: 01-02-1961           MRN: [id number] Visit Date: 04/13/2016 Requested by: No referring provider defined for this encounter. PCP: Lupe Carneyean Mitchell, MD  Subjective: Chief Complaint  Patient presents with  . Left Shoulder - Pain    HPI recent is a 51 shell patient with left shoulder pain.  She had an ultrasound-guided before meals joint injections 11/24/2014 which is helped her for longer than a year she has recurrence of symptoms.  Pain slowly worsening over time.  Certain movements hurt her.  Requests a prescription for Norco which she uses on a very sparing basis.  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  Patient denies  fevers or chills.    Assessment & Plan: Visit Diagnoses:  1. 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  Injection is performed today.  Indications refill.  Also added Valium.  We'll see her back as needed.  At some point she may need to have surgical resection of the distal clavicle along with biceps tenodesis for the labral tear but for now she is managing  Follow-Up Instructions: Return if symptoms worsen or fail to improve.   Orders:  No orders of the defined types were placed in this encounter.  Meds ordered this encounter  Medications  . HYDROcodone-acetaminophen (NORCO/VICODIN) 5-325 MG tablet    Sig: Take 1 tablet by mouth every 6 (six) hours as needed for moderate pain.    Dispense:  30 tablet    Refill:  0  . diazepam (VALIUM) 5 MG tablet    Sig: Take 1 tablet (5 mg total)  by mouth See admin instructions. Take 1 tablet before MRI scan for anxiety. May repeat if needed.    Dispense:  30 tablet    Refill:  0      Procedures: Medium Joint Inj Date/Time: 04/15/2016 4:38 PM Performed by: Cammy CopaEAN, SCOTT GREGORY Authorized by: Cammy CopaEAN, SCOTT GREGORY   Consent Given by:  Patient Site marked: the procedure site was marked   Timeout: prior to procedure the correct patient, procedure, and site was verified   Indications:  Pain and diagnostic evaluation Location:  Shoulder Site:  L acromioclavicular Prep: patient was prepped and draped in usual sterile fashion   Needle Size:  25 G Needle Length:  1.5 inches Approach:  Superior Ultrasound Guided: Yes   Fluoroscopic Guidance: No   Medications:  3 mL lidocaine 1 %; 20 mg triamcinolone acetonide 40 MG/ML; 0.66 mL bupivacaine 0.25 % Aspiration Attempted: No   Patient tolerance:  Patient tolerated the procedure well with no immediate complications      Clinical Data: No additional findings.  Objective: Vital Signs: There were no vitals taken for this visit.  Physical Exam   Constitutional: Patient appears well-developed HEENT:  Head: Normocephalic Eyes:EOM are normal Neck: Normal range of motion Cardiovascular: Normal rate Pulmonary/chest: Effort normal Neurologic: Patient is alert Skin: Skin is warm Psychiatric: Patient has normal  mood and affect    Ortho Exam examination of the left shoulder demonstrates tenderness at the acromioclavicular joint know weakness in external rotation left versus right full active and passive range of motion of both shoulders good motor sensory function in both arms cervical spine range of motion is normal negative apprehension relocation testing  Specialty Comments:  No specialty comments available.  Imaging: No results found.   PMFS History: Patient Active Problem List   Diagnosis Date Noted  . Arthritis of left acromioclavicular joint 04/13/2016   Past  Medical History:  Diagnosis Date  . Arthritis     No family history on file.  No past surgical history on file. Social History   Occupational History  . Not on file.   Social History Main Topics  . Smoking status: Former Games developermoker  . Smokeless tobacco: Never Used  . Alcohol use No  . Drug use: No  . Sexual activity: Not on file

## 2016-11-21 ENCOUNTER — Other Ambulatory Visit: Payer: Self-pay | Admitting: Ophthalmology

## 2017-11-07 ENCOUNTER — Telehealth (INDEPENDENT_AMBULATORY_CARE_PROVIDER_SITE_OTHER): Payer: Self-pay

## 2017-11-07 NOTE — Telephone Encounter (Signed)
appt scheduled

## 2017-11-07 NOTE — Telephone Encounter (Signed)
Patient would like to know if she can be worked in to see Dr. August Saucerean for right leg pain.  Patient hasn't been seen since 2017.  Cb# is [phone].  Please advise.  Thank you.

## 2017-11-08 ENCOUNTER — Ambulatory Visit (INDEPENDENT_AMBULATORY_CARE_PROVIDER_SITE_OTHER): Payer: BLUE CROSS/BLUE SHIELD | Admitting: Orthopedic Surgery

## 2018-09-07 DIAGNOSIS — I10 Essential (primary) hypertension: Secondary | ICD-10-CM | POA: Insufficient documentation

## 2018-09-07 DIAGNOSIS — E559 Vitamin D deficiency, unspecified: Secondary | ICD-10-CM | POA: Insufficient documentation

## 2018-09-07 DIAGNOSIS — G4733 Obstructive sleep apnea (adult) (pediatric): Secondary | ICD-10-CM | POA: Insufficient documentation

## 2020-03-16 DIAGNOSIS — H2513 Age-related nuclear cataract, bilateral: Secondary | ICD-10-CM | POA: Insufficient documentation

## 2020-11-27 ENCOUNTER — Encounter: Payer: Self-pay | Admitting: Family Medicine

## 2020-11-27 ENCOUNTER — Ambulatory Visit (INDEPENDENT_AMBULATORY_CARE_PROVIDER_SITE_OTHER): Payer: 59 | Admitting: Family Medicine

## 2020-11-27 ENCOUNTER — Ambulatory Visit: Payer: Self-pay

## 2020-11-27 ENCOUNTER — Other Ambulatory Visit: Payer: Self-pay

## 2020-11-27 DIAGNOSIS — M25511 Pain in right shoulder: Secondary | ICD-10-CM

## 2020-11-27 MED ORDER — HYDROCODONE-ACETAMINOPHEN 5-325 MG PO TABS: 1.0000 | ORAL_TABLET | Freq: Four times a day (QID) | ORAL | 0 refills | Status: DC | PRN

## 2020-11-27 NOTE — Progress Notes (Signed)
Generic Toradol 60 mg (2 cc of 30 mg/mL) given IM left upper outer gluteal region, per Dr. Prince Rome.

## 2020-11-27 NOTE — Progress Notes (Signed)
Office Visit Note   Patient: Alejandra [name]           Date of Birth: Aug 31, 1960           MRN: [id number] Visit Date: 11/27/2020 Requested by: Clovis Riley, L.August Saucer, MD [address] [place],  [place] [address] PCP: Clovis Riley, L.August Saucer, MD  Subjective: Chief Complaint  Patient presents with   Right Shoulder - Pain    Had an issue with frozen shoulder 10/26/20 - brought xray from that day (cd). Got better with several injections of Toradol (ED and then Dr. Sherlean Foot). Had a couple sessions of PT. She got better until 1&1/2 weeks ago. Pain starts in the upper arm and goes up into the shoulder. Decreased ROM again. Works as a Interior and spatial designer. Right-hand dominant.     HPI: She is here with right shoulder pain.  Symptoms started last week, no injury.  She has a history of frozen shoulder in June 2021.  She had x-rays and then as well as injection, and her pain eventually went away after some physical therapy.  She was pain-free until about a week and a half ago.  Pain with abduction of her arm.  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              ROS: She has diabetes which has been fairly well controlled.  All other systems were reviewed and are negative.  Objective: Vital Signs: There were no vitals taken for this visit.  Physical Exam:  General:  Alert and oriented, in no acute distress. Pulm:  Breathing unlabored. Psy:  Normal mood, congruent affect. 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  She has pain at the extreme.  She has tenderness in the anterior glenohumeral area and near the [facility] joint.  AC crossover test is equivocal.  Rotator cuff strength is 5/5.    Imaging: US Guided Needle Placement  Result Date: 11/27/2020 Ultrasound guided injection is preferred based studies that show increased duration, increased effect, greater accuracy, decreased procedural pain, increased response rate, and  decreased cost with ultrasound guided versus blind injection.   Verbal informed consent obtained.  Time-out conducted.  Noted no overlying erythema, induration, or other signs of local infection. Ultrasound-guided right glenohumeral injection: After sterile prep with Betadine, injected 4 cc 0.25% bupivocaine without epinephrine and 6 mg betamethasone using a 22-gauge spinal needle, passing the needle from posterior approach into the glenohumeral joint.  Injectate seen filling the joint capsule.    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 -We will inject the glenohumeral joint today.  If no better next week, she will contact us and we will order MRI scan.  Toradol IM given as well.     Procedures: No procedures performed        PMFS History: Patient Active Problem List   Diagnosis Date Noted   Arthritis of left acromioclavicular joint 04/13/2016   Past Medical History:  Diagnosis Date   Arthritis     History reviewed. No pertinent family history.  History reviewed. No pertinent surgical history. Social History   Occupational History   Not on file  Tobacco Use   Smoking status: Former   Smokeless tobacco: Never  Substance and Sexual Activity   Alcohol use: No   Drug use: No   Sexual activity: Not on file

## 2020-12-04 ENCOUNTER — Ambulatory Visit: Payer: BLUE CROSS/BLUE SHIELD | Admitting: Family Medicine

## 2021-02-10 ENCOUNTER — Ambulatory Visit: Payer: Self-pay

## 2021-02-10 ENCOUNTER — Encounter: Payer: Self-pay | Admitting: Orthopedic Surgery

## 2021-02-10 ENCOUNTER — Other Ambulatory Visit: Payer: Self-pay

## 2021-02-10 ENCOUNTER — Ambulatory Visit (INDEPENDENT_AMBULATORY_CARE_PROVIDER_SITE_OTHER): Payer: 59 | Admitting: Orthopedic Surgery

## 2021-02-10 DIAGNOSIS — M542 Cervicalgia: Secondary | ICD-10-CM | POA: Diagnosis not present

## 2021-02-10 DIAGNOSIS — M25511 Pain in right shoulder: Secondary | ICD-10-CM

## 2021-02-10 MED ORDER — DICLOFENAC-MISOPROSTOL 50-0.2 MG PO TBEC: 50.0000 mg | DELAYED_RELEASE_TABLET | Freq: Two times a day (BID) | ORAL | 0 refills | Status: DC

## 2021-02-10 MED ORDER — DIAZEPAM 5 MG PO TABS: ORAL_TABLET | ORAL | 0 refills | Status: DC

## 2021-02-10 MED ORDER — HYDROCODONE-ACETAMINOPHEN 5-325 MG PO TABS: ORAL_TABLET | ORAL | 0 refills | Status: DC

## 2021-02-10 NOTE — Progress Notes (Signed)
Office Visit Note   Alejandra [name]: Alejandra [name]           Date of Birth: Sep 20, 1960           MRN: [id number] Visit Date: 02/10/2021 Requested by: Clovis Riley, L.August Saucer, MD 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  This has been going on over a year.  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  She had a shoulder injection which helped her for a year.  She had glenohumeral injection this summer which did not help.  She is right-hand dominant.  Reports decreased active range of motion on that right side.  She states she is at a level of "20 5 out of 10 pain 24 hours a day".  Denies any radiation of symptoms below the elbow level.  She does take Vicodin on a fairly regular basis.              ROS: All systems reviewed are negative as they relate to the chief complaint within the history of present illness.  Alejandra [name] denies  fevers or chills.   Assessment & Plan: Visit Diagnoses:  1. Acute pain of right shoulder   2. Neck pain     Plan: Impression is right shoulder pain which does not look like it is coming from the neck.  I think it is possible she may have very early frozen shoulder based on very slight and subtle loss of passive range of motion right compared to left.  Symptoms have been ongoing now for a year.  Rotator cuff strength seems reasonable.  I think the [facility] joint could be giving her problem as well because she has problems sleeping on the right side.  Toradol injection given today just for some immediate pain relief.  Norco prescribed for nighttime use until we get more information about the source of the pain.  Open MRI arthrogram right shoulder to evaluate rotator cuff tear indicated at this time.  Alejandra [name] does have diabetes with  hemoglobin A1c 7.4 most recent visit.  Follow-Up Instructions: Return if symptoms worsen or fail to improve.   Orders:  Orders Placed This Encounter  Procedures   XR Shoulder Right   XR Cervical Spine 2 or 3 views   MR SHOULDER RIGHT W CONTRAST   Arthrogram   Meds ordered this encounter  Medications   HYDROcodone-acetaminophen (NORCO/VICODIN) 5-325 MG tablet    Sig: 1 po q hs prn pain    Dispense:  20 tablet    Refill:  0   diazepam (VALIUM) 5 MG tablet    Sig: Take 1 tablet before MRI scan for anxiety. May repeat if needed.    Dispense:  3 tablet    Refill:  0   Diclofenac-miSOPROStol 50-0.2 MG TBEC    Sig: Take 50 mg by mouth 2 (two) times daily.    Dispense:  60 tablet    Refill:  0      Procedures: No procedures performed   Clinical Data: No additional findings.  Objective: Vital Signs: There were no vitals taken for this visit.  Physical Exam:   Constitutional: Alejandra [name] appears well-developed HEENT:  Head: Normocephalic Eyes:EOM are normal Neck: Normal range of motion Cardiovascular: Normal rate Pulmonary/chest: Effort normal Neurologic: Alejandra [name]  is alert Skin: Skin is warm Psychiatric: Alejandra [name] has normal mood and affect   Ortho Exam: Ortho exam demonstrates full active and passive range of motion of the cervical spine.  On the right-hand side the Alejandra [name] has passive range of motion of of 70/85/170.  On the left it is 80/90/180.  Rotator cuff strength is intact infraspinatus supraspinatus subscap muscle testing on the right-hand side.  Mild AC joint tenderness is present.  Not too much coarseness or grinding with active or passive range of motion of that right shoulder.  Specialty Comments:  No specialty comments available.  Imaging: XR Cervical Spine 2 or 3 views  Result Date: 02/10/2021 AP lateral radiographs cervical spine reviewed.  There is some loss of lordosis.  Mild right-sided mid cervical level facet arthropathy is present seen on the AP  view.  No significant degenerative disc disease visualized.  No acute fracture or spondylolisthesis.  XR Shoulder Right  Result Date: 02/10/2021 AP axillary outlet views right shoulder reviewed.  No glenohumeral joint arthritis.  Moderate AC joint arthritis is present.  Acromiohumeral distance maintained.  Visualized lung fields clear.  No acute fracture.  Shoulder is located.    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 No pertinent family history.  History reviewed. No pertinent surgical history. Social History   Occupational History   Not on file  Tobacco Use   Smoking status: Former   Smokeless tobacco: Never  Substance and Sexual Activity   Alcohol use: No   Drug use: No   Sexual activity: Not on file

## 2021-02-12 ENCOUNTER — Telehealth: Payer: Self-pay | Admitting: Orthopedic Surgery

## 2021-02-12 NOTE — Telephone Encounter (Signed)
Called left pt 1X vm to set an appt with Dr. August Saucer after 10/26 for MRI review

## 2021-02-15 ENCOUNTER — Telehealth: Payer: Self-pay | Admitting: Orthopedic Surgery

## 2021-02-15 NOTE — Telephone Encounter (Signed)
Called 2x and left vm for pt to call and set an MRI Review appt after 10/26 with Dr. August Saucer

## 2021-02-24 ENCOUNTER — Ambulatory Visit
Admission: RE | Admit: 2021-02-24 | Discharge: 2021-02-24 | Disposition: A | Discharge status: Home / Self Care | Payer: 59 | Source: Ambulatory Visit | Attending: Orthopedic Surgery | Admitting: Orthopedic Surgery

## 2021-02-24 ENCOUNTER — Other Ambulatory Visit: Payer: Self-pay

## 2021-02-24 DIAGNOSIS — M25511 Pain in right shoulder: Secondary | ICD-10-CM

## 2021-02-24 MED ORDER — IOPAMIDOL (ISOVUE-M 200) INJECTION 41%
13.0000 mL | Freq: Once | INTRAMUSCULAR | Status: AC
  Administered 2021-02-24: 13 mL via INTRA_ARTICULAR

## 2021-03-01 ENCOUNTER — Other Ambulatory Visit: Payer: Self-pay

## 2021-03-01 ENCOUNTER — Ambulatory Visit (INDEPENDENT_AMBULATORY_CARE_PROVIDER_SITE_OTHER): Payer: 59 | Admitting: Orthopedic Surgery

## 2021-03-01 ENCOUNTER — Encounter: Payer: Self-pay | Admitting: Orthopedic Surgery

## 2021-03-01 ENCOUNTER — Ambulatory Visit: Payer: Self-pay

## 2021-03-01 DIAGNOSIS — M7551 Bursitis of right shoulder: Secondary | ICD-10-CM | POA: Diagnosis not present

## 2021-03-01 DIAGNOSIS — M25511 Pain in right shoulder: Secondary | ICD-10-CM | POA: Diagnosis not present

## 2021-03-01 DIAGNOSIS — M7531 Calcific tendinitis of right shoulder: Secondary | ICD-10-CM | POA: Diagnosis not present

## 2021-03-01 NOTE — Progress Notes (Signed)
Office Visit Note   Patient: Alejandra [name]           Date of Birth: [birth year]           MRN: [id number] Visit Date: 03/01/2021 Requested by: Clovis Riley, L.August Saucer, MD [address] [place],  [place] [address] PCP: Clovis Riley, L.August Saucer, MD  Subjective: Chief Complaint  Patient presents with   Other    Scan review    HPI: Alejandra [name] is a 60 y.o. female who presents to the office for MRI review. Patient denies any changes in symptoms.  Continues to complain mainly of anterolateral pain with radiation to the bicep.  Patient complains of difficulty lifting the arm due to pain.  She has no functional problem but it is really just pain that bothers her.  She has particular difficulty due to pain when she reaches forward.  She has constant all day pain that is worse at night and wakes her up every night.  She has tried Tylenol and Aleve without relief.  She had prior injection by Dr. Prince Rome in the glenohumeral joint without much relief except for about 2 days.  She also has a history of frozen shoulder that was treated by Dr. Sherlean Foot with injection.  This got better for about a year before her most recent episode of shoulder pain began.  She works as a Scientist, research (medical).  She does have history of diabetes with last A1c 7.4 about 2 months ago.  MRI results revealed: MR SHOULDER RIGHT W CONTRAST  Result Date: 02/26/2021 CLINICAL DATA:  Chronic severe right shoulder pain. No prior surgery. EXAM: MR ARTHROGRAM OF THE RIGHT SHOULDER TECHNIQUE: Multiplanar, multisequence MR imaging of the right shoulder was performed following the administration of intra-articular contrast. CONTRAST:  See Injection Documentation. COMPARISON:  Right shoulder x-rays dated February 10, 2021. FINDINGS: Rotator cuff: Intact rotator cuff. 1.0 cm calcification in the anterior supraspinatus tendon at the insertion with surrounding soft tissue swelling (series 7, image 11; series 4, image 15). Mild supraspinatus and infraspinatus  tendinosis. Muscles:  No focal muscular atrophy or edema. Biceps long head:  Intact and normally positioned. Acromioclavicular Joint: The acromion is type II. There are mild-to-moderate acromioclavicular degenerative changes. Small amount of fluid but no contrast in the subacromial/subdeltoid bursa. Glenohumeral Joint: Distended with intra-articular contrast. No chondral defect. Labrum:  No evidence of labral tear. Bones: No acute fracture or dislocation. No suspicious bone lesion. Other: None. IMPRESSION: 1. Intact rotator cuff. Calcific tendinitis of the supraspinatus tendon. 2. Mild subacromial/subdeltoid bursitis. 3. Mild to moderate acromioclavicular osteoarthritis. Electronically Signed   By: Obie Dredge M.D.   On: 02/26/2021 16:39                 ROS: All systems reviewed are negative as they relate to the chief complaint within the history of present illness.  Patient denies fevers or chills.  Assessment & Plan: Visit Diagnoses:  1. Calcific tendinitis of right shoulder   2. Acute pain of right shoulder     Plan: Alejandra [name] is a 60 y.o. female who presents to the office for evaluation of right shoulder pain.  She has history of right frozen shoulder that was successfully treated in the past by Dr. Sherlean Foot with injection.  Most recent episode of shoulder pain has been ongoing for about 4 months.  She had glenohumeral injection by Dr. Prince Rome without lasting relief in July 2022.  She is here today to review MRI of the right shoulder.  MRI demonstrates 1 cm calcification in the anterior supraspinatus tendon with no radiographic evidence of glenohumeral osteoarthritis or adhesive capsulitis.  Discussed options available to patient including doing nothing and letting the condition run its course versus cortisone injection versus ultrasound-guided saline lavage versus surgical debridement.  After lengthy discussion, decided on ultrasound-guided saline lavage of the area of calcification with  subsequent subacromial injection.  The area of calcification was identified on the ultrasound with patient supine.  After anesthesia with 1% lidocaine, 22-gauge spinal needle was introduced into the area of calcification successfully and the calcification was lavaged with 5 cc of saline with multiple subsequent penetrations by the needle.  None of the calcification was able to be aspirated.  Patient tolerated procedure well and the subsequent subacromial injection well.  Plan to follow-up with the office as needed and she will give it about 2 weeks to see if it has an effect.  If no improvement after 2 weeks, she will call the office to follow-up and we can discuss further options.  AC joint not particularly tender today  Follow-Up Instructions: No follow-ups on file.   Orders:  Orders Placed This Encounter  Procedures   US Guided Needle Placement - No Linked Charges   No orders of the defined types were placed in this encounter.     Procedures: Large Joint Inj: R subacromial bursa on 03/01/2021 7:54 PM Indications: diagnostic evaluation and pain Details: 18 G 1.5 in needle, posterior approach  Arthrogram: No  Medications: 9 mL bupivacaine 0.5 %; 40 mg methylPREDNISolone acetate 40 MG/ML Outcome: tolerated well, no immediate complications Procedure, treatment alternatives, risks and benefits explained, specific risks discussed. Consent was given by the patient. Immediately prior to procedure a time out was called to verify the correct patient, procedure, equipment, support staff and site/side marked as required. Patient was prepped and draped in the usual sterile fashion.    Large Joint Inj: R subacromial bursa on 03/01/2021 7:55 PM Indications: diagnostic evaluation and pain Details: 22 G 3.5 in needle, ultrasound-guided anterolateral approach  Arthrogram: No  Medications: 5 mL lidocaine 1 % (With 5 cc Normal saline subsequently) Outcome: tolerated well, no immediate  complications  Focal area of calcification identified on ultrasound.  Anesthesia provided by 1% lidocaine 5 cc.  22-gauge spinal needle was guided into the area of calcification with delivery of 5 cc of sterile normal saline.  The spinal needle was repeatedly reintroduced 3-4 times into the area of calcification.  Aspiration was attempted but no aspirate was obtained.  Ultrasound pictures taken of area of calcification.  Patient tolerated procedure well. Procedure, treatment alternatives, risks and benefits explained, specific risks discussed. Consent was given by the patient. Immediately prior to procedure a time out was called to verify the correct patient, procedure, equipment, support staff and site/side marked as required. Patient was prepped and draped in the usual sterile fashion.      Clinical Data: No additional findings.  Objective: Vital Signs: There were no vitals taken for this visit.  Physical Exam:  Constitutional: Patient appears well-developed HEENT:  Head: Normocephalic Eyes:EOM are normal Neck: Normal range of motion Cardiovascular: Normal rate Pulmonary/chest: Effort normal Neurologic: Patient is alert Skin: Skin is warm Psychiatric: Patient has normal mood and affect  Ortho Exam: Ortho exam demonstrates right shoulder with 45 degrees X rotation, 90 degrees abduction, 150 degrees forward flexion.  Excellent rotator cuff strength of supraspinatus, infraspinatus, subscapularis rated 5/5.  She has pain with resisted supraspinatus and infraspinatus strength testing in  particular.  Tenderness over the anterior aspect of the shoulder diffusely but in particular she has tenderness that is just lateral to the bicipital groove.  Specialty Comments:  No specialty comments available.  Imaging: No results found.   PMFS History: Patient Active Problem List   Diagnosis Date Noted   Arthritis of left acromioclavicular joint 04/13/2016   Past Medical History:  Diagnosis  Date   Arthritis     No family history on file.  No past surgical history on file. Social History   Occupational History   Not on file  Tobacco Use   Smoking status: Former   Smokeless tobacco: Never  Substance and Sexual Activity   Alcohol use: No   Drug use: No   Sexual activity: Not on file

## 2021-03-09 ENCOUNTER — Encounter: Payer: Self-pay | Admitting: Orthopedic Surgery

## 2021-03-09 MED ORDER — METHYLPREDNISOLONE ACETATE 40 MG/ML IJ SUSP
40.0000 mg | INTRAMUSCULAR | Status: AC | PRN
Start: 2021-03-01 — End: 2021-03-01
  Administered 2021-03-01: 40 mg via INTRA_ARTICULAR

## 2021-03-09 MED ORDER — BUPIVACAINE HCL 0.5 % IJ SOLN
9.0000 mL | INTRAMUSCULAR | Status: AC | PRN
  Administered 2021-03-01: 9 mL via INTRA_ARTICULAR

## 2021-03-09 MED ORDER — LIDOCAINE HCL 1 % IJ SOLN
5.0000 mL | INTRAMUSCULAR | Status: AC | PRN
  Administered 2021-03-01: 5 mL

## 2021-11-27 ENCOUNTER — Emergency Department (HOSPITAL_COMMUNITY): Payer: 59

## 2021-11-27 ENCOUNTER — Inpatient Hospital Stay (HOSPITAL_COMMUNITY)
Admission: EM | Admit: 2021-11-27 | Discharge: 2021-12-06 | DRG: 871 | Disposition: A | Discharge status: Home Health | Payer: 59 | Attending: Internal Medicine | Admitting: Internal Medicine

## 2021-11-27 DIAGNOSIS — J159 Unspecified bacterial pneumonia: Secondary | ICD-10-CM | POA: Diagnosis present

## 2021-11-27 DIAGNOSIS — G928 Other toxic encephalopathy: Secondary | ICD-10-CM | POA: Diagnosis not present

## 2021-11-27 DIAGNOSIS — J1282 Pneumonia due to coronavirus disease 2019: Secondary | ICD-10-CM | POA: Diagnosis present

## 2021-11-27 DIAGNOSIS — R6521 Severe sepsis with septic shock: Secondary | ICD-10-CM | POA: Diagnosis not present

## 2021-11-27 DIAGNOSIS — R7989 Other specified abnormal findings of blood chemistry: Secondary | ICD-10-CM | POA: Diagnosis not present

## 2021-11-27 DIAGNOSIS — E1165 Type 2 diabetes mellitus with hyperglycemia: Secondary | ICD-10-CM | POA: Diagnosis present

## 2021-11-27 DIAGNOSIS — G4733 Obstructive sleep apnea (adult) (pediatric): Secondary | ICD-10-CM | POA: Diagnosis present

## 2021-11-27 DIAGNOSIS — Z7984 Long term (current) use of oral hypoglycemic drugs: Secondary | ICD-10-CM | POA: Diagnosis not present

## 2021-11-27 DIAGNOSIS — N179 Acute kidney failure, unspecified: Secondary | ICD-10-CM | POA: Diagnosis present

## 2021-11-27 DIAGNOSIS — Z6841 Body Mass Index (BMI) 40.0 and over, adult: Secondary | ICD-10-CM | POA: Diagnosis not present

## 2021-11-27 DIAGNOSIS — R339 Retention of urine, unspecified: Secondary | ICD-10-CM | POA: Diagnosis present

## 2021-11-27 DIAGNOSIS — I959 Hypotension, unspecified: Secondary | ICD-10-CM

## 2021-11-27 DIAGNOSIS — Z8249 Family history of ischemic heart disease and other diseases of the circulatory system: Secondary | ICD-10-CM | POA: Diagnosis not present

## 2021-11-27 DIAGNOSIS — Z87891 Personal history of nicotine dependence: Secondary | ICD-10-CM | POA: Diagnosis not present

## 2021-11-27 DIAGNOSIS — Z79899 Other long term (current) drug therapy: Secondary | ICD-10-CM | POA: Diagnosis not present

## 2021-11-27 DIAGNOSIS — R079 Chest pain, unspecified: Secondary | ICD-10-CM | POA: Diagnosis not present

## 2021-11-27 DIAGNOSIS — Z91199 Patient's noncompliance with other medical treatment and regimen due to unspecified reason: Secondary | ICD-10-CM | POA: Diagnosis not present

## 2021-11-27 DIAGNOSIS — J9601 Acute respiratory failure with hypoxia: Secondary | ICD-10-CM | POA: Diagnosis not present

## 2021-11-27 DIAGNOSIS — U071 COVID-19: Secondary | ICD-10-CM | POA: Diagnosis not present

## 2021-11-27 DIAGNOSIS — A4189 Other specified sepsis: Secondary | ICD-10-CM | POA: Diagnosis not present

## 2021-11-27 DIAGNOSIS — R0602 Shortness of breath: Secondary | ICD-10-CM | POA: Diagnosis present

## 2021-11-27 DIAGNOSIS — I5033 Acute on chronic diastolic (congestive) heart failure: Secondary | ICD-10-CM | POA: Diagnosis not present

## 2021-11-27 DIAGNOSIS — A419 Sepsis, unspecified organism: Secondary | ICD-10-CM

## 2021-11-27 DIAGNOSIS — E872 Acidosis, unspecified: Secondary | ICD-10-CM | POA: Diagnosis present

## 2021-11-27 HISTORY — DX: Type 2 diabetes mellitus without complications: E11.9

## 2021-11-27 LAB — CBC
HCT: 41.1 % (ref 36.0–46.0)
Hemoglobin: 13.7 g/dL (ref 12.0–15.0)
MCH: 30.6 pg (ref 26.0–34.0)
MCHC: 33.3 g/dL (ref 30.0–36.0)
MCV: 91.9 fL (ref 80.0–100.0)
Platelets: 153 10*3/uL (ref 150–400)
RBC: 4.47 MIL/uL (ref 3.87–5.11)
RDW: 13.7 % (ref 11.5–15.5)
WBC: 10.4 10*3/uL (ref 4.0–10.5)
nRBC: 0 % (ref 0.0–0.2)

## 2021-11-27 LAB — CBC WITH DIFFERENTIAL/PLATELET
Abs Immature Granulocytes: 0 10*3/uL (ref 0.00–0.07)
Basophils Absolute: 0 10*3/uL (ref 0.0–0.1)
Basophils Relative: 0 %
Eosinophils Absolute: 0 10*3/uL (ref 0.0–0.5)
Eosinophils Relative: 0 %
HCT: 45.2 % (ref 36.0–46.0)
Hemoglobin: 15 g/dL (ref 12.0–15.0)
Lymphocytes Relative: 6 %
Lymphs Abs: 0.4 10*3/uL — ABNORMAL LOW (ref 0.7–4.0)
MCH: 30.5 pg (ref 26.0–34.0)
MCHC: 33.2 g/dL (ref 30.0–36.0)
MCV: 91.9 fL (ref 80.0–100.0)
Monocytes Absolute: 0 10*3/uL — ABNORMAL LOW (ref 0.1–1.0)
Monocytes Relative: 0 %
Neutro Abs: 6.2 10*3/uL (ref 1.7–7.7)
Neutrophils Relative %: 94 %
Platelets: 172 10*3/uL (ref 150–400)
RBC: 4.92 MIL/uL (ref 3.87–5.11)
RDW: 13.6 % (ref 11.5–15.5)
WBC: 6.6 10*3/uL (ref 4.0–10.5)
nRBC: 0 % (ref 0.0–0.2)
nRBC: 0 /100 WBC

## 2021-11-27 LAB — LIPASE, BLOOD: Lipase: 29 U/L (ref 11–51)

## 2021-11-27 LAB — TROPONIN I (HIGH SENSITIVITY)
Troponin I (High Sensitivity): 28 ng/L — ABNORMAL HIGH (ref <18)
Troponin I (High Sensitivity): 29 ng/L — ABNORMAL HIGH (ref <18)

## 2021-11-27 LAB — PROTIME-INR
INR: 1.1 (ref 0.8–1.2)
Prothrombin Time: 14.3 seconds (ref 11.4–15.2)

## 2021-11-27 LAB — GLUCOSE, CAPILLARY
Glucose-Capillary: 187 mg/dL — ABNORMAL HIGH (ref 70–99)
Glucose-Capillary: 261 mg/dL — ABNORMAL HIGH (ref 70–99)

## 2021-11-27 LAB — COMPREHENSIVE METABOLIC PANEL
ALT: 24 U/L (ref 0–44)
AST: 41 U/L (ref 15–41)
Albumin: 3.1 g/dL — ABNORMAL LOW (ref 3.5–5.0)
Alkaline Phosphatase: 67 U/L (ref 38–126)
Anion gap: 15 (ref 5–15)
BUN: 12 mg/dL (ref 8–23)
CO2: 23 mmol/L (ref 22–32)
Calcium: 8.2 mg/dL — ABNORMAL LOW (ref 8.9–10.3)
Chloride: 99 mmol/L (ref 98–111)
Creatinine, Ser: 2.7 mg/dL — ABNORMAL HIGH (ref 0.44–1.00)
GFR, Estimated: 19 mL/min — ABNORMAL LOW (ref >60)
Glucose, Bld: 195 mg/dL — ABNORMAL HIGH (ref 70–99)
Potassium: 3.7 mmol/L (ref 3.5–5.1)
Sodium: 137 mmol/L (ref 135–145)
Total Bilirubin: 0.7 mg/dL (ref 0.3–1.2)
Total Protein: 6.7 g/dL (ref 6.5–8.1)

## 2021-11-27 LAB — RESP PANEL BY RT-PCR (FLU A&B, COVID) ARPGX2
Influenza A by PCR: NEGATIVE
Influenza B by PCR: NEGATIVE
SARS Coronavirus 2 by RT PCR: POSITIVE — AB

## 2021-11-27 LAB — I-STAT VENOUS BLOOD GAS, ED
Acid-base deficit: 3 mmol/L — ABNORMAL HIGH (ref 0.0–2.0)
Bicarbonate: 25.1 mmol/L (ref 20.0–28.0)
Calcium, Ion: 1.03 mmol/L — ABNORMAL LOW (ref 1.15–1.40)
HCT: 46 % (ref 36.0–46.0)
Hemoglobin: 15.6 g/dL — ABNORMAL HIGH (ref 12.0–15.0)
O2 Saturation: 71 %
Potassium: 3.7 mmol/L (ref 3.5–5.1)
Sodium: 137 mmol/L (ref 135–145)
TCO2: 27 mmol/L (ref 22–32)
pCO2, Ven: 54.6 mmHg (ref 44–60)
pH, Ven: 7.271 (ref 7.25–7.43)
pO2, Ven: 43 mmHg (ref 32–45)

## 2021-11-27 LAB — LACTIC ACID, PLASMA
Lactic Acid, Venous: 6.2 mmol/L (ref 0.5–1.9)
Lactic Acid, Venous: 5.2 mmol/L (ref 0.5–1.9)
Lactic Acid, Venous: 4 mmol/L (ref 0.5–1.9)

## 2021-11-27 LAB — HIV ANTIBODY (ROUTINE TESTING W REFLEX): HIV Screen 4th Generation wRfx: NONREACTIVE

## 2021-11-27 LAB — MRSA NEXT GEN BY PCR, NASAL: MRSA by PCR Next Gen: DETECTED — AB

## 2021-11-27 LAB — CREATININE, SERUM
Creatinine, Ser: 2.61 mg/dL — ABNORMAL HIGH (ref 0.44–1.00)
GFR, Estimated: 20 mL/min — ABNORMAL LOW (ref >60)

## 2021-11-27 LAB — HEMOGLOBIN A1C
Hgb A1c MFr Bld: 7.2 % — ABNORMAL HIGH (ref 4.8–5.6)
Mean Plasma Glucose: 159.94 mg/dL

## 2021-11-27 LAB — BRAIN NATRIURETIC PEPTIDE: B Natriuretic Peptide: 141.4 pg/mL — ABNORMAL HIGH (ref 0.0–100.0)

## 2021-11-27 LAB — D-DIMER, QUANTITATIVE: D-Dimer, Quant: 10.4 ug/mL-FEU — ABNORMAL HIGH (ref 0.00–0.50)

## 2021-11-27 LAB — PROCALCITONIN: Procalcitonin: >150 ng/mL

## 2021-11-27 LAB — APTT: aPTT: 34 seconds (ref 24–36)

## 2021-11-27 MED ORDER — SODIUM CHLORIDE 0.9% FLUSH
10.0000 mL | Freq: Two times a day (BID) | INTRAVENOUS | Status: DC
  Administered 2021-11-28 – 2021-11-29 (×4): 10 mL
  Administered 2021-11-30: 30 mL
  Administered 2021-11-30: 10 mL

## 2021-11-27 MED ORDER — HEPARIN BOLUS VIA INFUSION
4000.0000 [IU] | Freq: Once | INTRAVENOUS | Status: DC
  Filled 2021-11-27: qty 4000

## 2021-11-27 MED ORDER — SODIUM CHLORIDE 0.9 % IV SOLN: 250.0000 mL | INTRAVENOUS | Status: DC

## 2021-11-27 MED ORDER — SODIUM CHLORIDE 0.9% FLUSH: 10.0000 mL | INTRAVENOUS | Status: DC | PRN

## 2021-11-27 MED ORDER — LIDOCAINE HCL (PF) 1 % IJ SOLN
INTRAMUSCULAR | Status: AC
  Filled 2021-11-27: qty 5

## 2021-11-27 MED ORDER — SODIUM CHLORIDE 0.9 % IV SOLN
100.0000 mg | Freq: Every day | INTRAVENOUS | Status: AC
  Administered 2021-11-28 – 2021-11-29 (×2): 100 mg via INTRAVENOUS
  Filled 2021-11-27 (×2): qty 20

## 2021-11-27 MED ORDER — DEXTROSE 5 % IV SOLN
250.0000 mg | INTRAVENOUS | Status: DC
  Administered 2021-11-29 – 2021-11-30 (×2): 250 mg via INTRAVENOUS
  Filled 2021-11-27 (×3): qty 2.5

## 2021-11-27 MED ORDER — VASOPRESSIN 20 UNITS/100 ML INFUSION FOR SHOCK
0.0000 [IU]/min | INTRAVENOUS | Status: DC
  Administered 2021-11-27 – 2021-11-29 (×6): 0.03 [IU]/min via INTRAVENOUS
  Filled 2021-11-27 (×4): qty 100

## 2021-11-27 MED ORDER — ACETAMINOPHEN 650 MG RE SUPP: 650.0000 mg | Freq: Four times a day (QID) | RECTAL | Status: DC | PRN

## 2021-11-27 MED ORDER — INSULIN ASPART 100 UNIT/ML IJ SOLN
0.0000 [IU] | INTRAMUSCULAR | Status: DC
  Administered 2021-11-27: 3 [IU] via SUBCUTANEOUS
  Administered 2021-11-27 – 2021-11-28 (×2): 8 [IU] via SUBCUTANEOUS
  Administered 2021-11-28: 3 [IU] via SUBCUTANEOUS
  Administered 2021-11-28 (×2): 5 [IU] via SUBCUTANEOUS
  Administered 2021-11-28: 3 [IU] via SUBCUTANEOUS
  Administered 2021-11-28: 8 [IU] via SUBCUTANEOUS
  Administered 2021-11-29: 3 [IU] via SUBCUTANEOUS
  Administered 2021-11-29 (×2): 5 [IU] via SUBCUTANEOUS
  Administered 2021-11-29: 2 [IU] via SUBCUTANEOUS
  Administered 2021-11-29: 5 [IU] via SUBCUTANEOUS
  Administered 2021-11-29: 11 [IU] via SUBCUTANEOUS
  Administered 2021-11-30: 3 [IU] via SUBCUTANEOUS
  Administered 2021-11-30 (×2): 5 [IU] via SUBCUTANEOUS
  Administered 2021-11-30 – 2021-12-01 (×3): 3 [IU] via SUBCUTANEOUS
  Administered 2021-12-01: 5 [IU] via SUBCUTANEOUS
  Administered 2021-12-01: 3 [IU] via SUBCUTANEOUS
  Administered 2021-12-01 (×2): 5 [IU] via SUBCUTANEOUS
  Administered 2021-12-01: 11 [IU] via SUBCUTANEOUS
  Administered 2021-12-02: 5 [IU] via SUBCUTANEOUS
  Administered 2021-12-02 (×3): 3 [IU] via SUBCUTANEOUS
  Administered 2021-12-02: 2 [IU] via SUBCUTANEOUS
  Administered 2021-12-02: 5 [IU] via SUBCUTANEOUS
  Administered 2021-12-03: 2 [IU] via SUBCUTANEOUS
  Administered 2021-12-03: 3 [IU] via SUBCUTANEOUS
  Administered 2021-12-03: 11 [IU] via SUBCUTANEOUS
  Administered 2021-12-03: 8 [IU] via SUBCUTANEOUS
  Administered 2021-12-03: 11 [IU] via SUBCUTANEOUS
  Administered 2021-12-04: 15 [IU] via SUBCUTANEOUS
  Administered 2021-12-04: 3 [IU] via SUBCUTANEOUS
  Administered 2021-12-04: 5 [IU] via SUBCUTANEOUS
  Administered 2021-12-05: 3 [IU] via SUBCUTANEOUS

## 2021-11-27 MED ORDER — LACTATED RINGERS IV BOLUS (SEPSIS)
1000.0000 mL | Freq: Once | INTRAVENOUS | Status: AC
  Administered 2021-11-27: 1000 mL via INTRAVENOUS

## 2021-11-27 MED ORDER — SODIUM CHLORIDE 0.9 % IV SOLN: INTRAVENOUS | Status: DC | PRN

## 2021-11-27 MED ORDER — LACTATED RINGERS IV SOLN: INTRAVENOUS | Status: AC

## 2021-11-27 MED ORDER — ACETAMINOPHEN 10 MG/ML IV SOLN
1000.0000 mg | Freq: Once | INTRAVENOUS | Status: AC
  Administered 2021-11-27: 1000 mg via INTRAVENOUS
  Filled 2021-11-27: qty 100

## 2021-11-27 MED ORDER — LEVOFLOXACIN IN D5W 750 MG/150ML IV SOLN
750.0000 mg | Freq: Once | INTRAVENOUS | Status: AC
  Administered 2021-11-27: 750 mg via INTRAVENOUS
  Filled 2021-11-27: qty 150

## 2021-11-27 MED ORDER — NOREPINEPHRINE 4 MG/250ML-% IV SOLN
2.0000 ug/min | INTRAVENOUS | Status: DC
  Administered 2021-11-27 (×2): 25 ug/min via INTRAVENOUS
  Administered 2021-11-27: 10 ug/min via INTRAVENOUS
  Filled 2021-11-27 (×2): qty 250

## 2021-11-27 MED ORDER — ACETAMINOPHEN 325 MG PO TABS: 650.0000 mg | ORAL_TABLET | Freq: Once | ORAL | Status: DC

## 2021-11-27 MED ORDER — SODIUM CHLORIDE 0.9 % IV SOLN
2.0000 g | INTRAVENOUS | Status: DC
  Administered 2021-11-27 – 2021-11-28 (×2): 2 g via INTRAVENOUS
  Filled 2021-11-27 (×2): qty 20

## 2021-11-27 MED ORDER — IPRATROPIUM-ALBUTEROL 0.5-2.5 (3) MG/3ML IN SOLN
3.0000 mL | RESPIRATORY_TRACT | Status: DC | PRN
  Administered 2021-11-29 – 2021-11-30 (×2): 3 mL via RESPIRATORY_TRACT
  Filled 2021-11-27 (×3): qty 3

## 2021-11-27 MED ORDER — SODIUM CHLORIDE 0.9 % IV SOLN
500.0000 mg | Freq: Once | INTRAVENOUS | Status: AC
  Administered 2021-11-28: 500 mg via INTRAVENOUS
  Filled 2021-11-27: qty 5

## 2021-11-27 MED ORDER — NOREPINEPHRINE 4 MG/250ML-% IV SOLN
INTRAVENOUS | Status: AC
  Administered 2021-11-27: 2 mg
  Filled 2021-11-27: qty 250

## 2021-11-27 MED ORDER — LACTATED RINGERS IV BOLUS (SEPSIS)
500.0000 mL | Freq: Once | INTRAVENOUS | Status: AC
  Administered 2021-11-27: 500 mL via INTRAVENOUS

## 2021-11-27 MED ORDER — SODIUM CHLORIDE 0.9 % IV SOLN
200.0000 mg | Freq: Once | INTRAVENOUS | Status: AC
  Administered 2021-11-27: 200 mg via INTRAVENOUS
  Filled 2021-11-27: qty 40

## 2021-11-27 MED ORDER — DEXAMETHASONE SODIUM PHOSPHATE 10 MG/ML IJ SOLN
6.0000 mg | INTRAMUSCULAR | Status: DC
  Administered 2021-11-27 – 2021-11-30 (×4): 6 mg via INTRAVENOUS
  Filled 2021-11-27 (×4): qty 1

## 2021-11-27 MED ORDER — FENTANYL CITRATE PF 50 MCG/ML IJ SOSY
25.0000 ug | PREFILLED_SYRINGE | Freq: Once | INTRAMUSCULAR | Status: AC
  Administered 2021-11-27: 25 ug via INTRAVENOUS
  Filled 2021-11-27: qty 1

## 2021-11-27 MED ORDER — LIDOCAINE 1% INJECTION FOR CIRCUMCISION
5.0000 mL | INJECTION | Freq: Once | INTRAVENOUS | Status: DC
Start: 2021-11-27 — End: 2021-11-27

## 2021-11-27 MED ORDER — CHLORHEXIDINE GLUCONATE CLOTH 2 % EX PADS
6.0000 | MEDICATED_PAD | Freq: Every day | CUTANEOUS | Status: DC
  Administered 2021-11-27 – 2021-12-06 (×9): 6 via TOPICAL

## 2021-11-27 MED ORDER — NOREPINEPHRINE 4 MG/250ML-% IV SOLN: 0.0000 ug/min | INTRAVENOUS | Status: DC

## 2021-11-27 MED ORDER — HEPARIN (PORCINE) 25000 UT/250ML-% IV SOLN
2150.0000 [IU]/h | INTRAVENOUS | Status: DC
  Administered 2021-11-28: 1350 [IU]/h via INTRAVENOUS
  Administered 2021-11-28: 1250 [IU]/h via INTRAVENOUS
  Administered 2021-11-29: 2150 [IU]/h via INTRAVENOUS
  Administered 2021-11-29: 1700 [IU]/h via INTRAVENOUS
  Filled 2021-11-27 (×5): qty 250

## 2021-11-27 MED ORDER — NOREPINEPHRINE 4 MG/250ML-% IV SOLN
0.0000 ug/min | INTRAVENOUS | Status: DC
  Administered 2021-11-28 (×2): 5 ug/min via INTRAVENOUS
  Administered 2021-11-28: 25 ug/min via INTRAVENOUS
  Administered 2021-11-28: 15 ug/min via INTRAVENOUS
  Administered 2021-11-29: 5 ug/min via INTRAVENOUS
  Filled 2021-11-27 (×4): qty 250

## 2021-11-27 MED ORDER — HEPARIN SODIUM (PORCINE) 5000 UNIT/ML IJ SOLN
5000.0000 [IU] | Freq: Three times a day (TID) | INTRAMUSCULAR | Status: DC
Start: 2021-11-27 — End: 2021-11-27
  Administered 2021-11-27: 5000 [IU] via SUBCUTANEOUS
  Filled 2021-11-27 (×2): qty 1

## 2021-11-27 NOTE — H&P (Addendum)
NAME:  Alejandra [name], MRN:  [id number], DOB:  Sep 23, 1960, LOS: 0 ADMISSION DATE:  11/27/2021, CONSULTATION DATE:  11/27/2021 REFERRING MD:  Dr. Suezanne Jacquet - ED, CHIEF COMPLAINT:  Sepsis    History of Present Illness:  Alejandra [name] is a 61 y.o. female with a PMH significant for arthritis, DMII, obesity and OSA non-compliant with CPAP who presented to the ED with SOB.  Reports recently returned from trip with family and majority of people traveling with her tested positive for COVID.  She became symptomatic 7/25 and quarantined at her son's house until last evening when she came home to her husband. He reports she had altered mentation and he called EMS today. Per EMS on room air patient was satting 70% with heart rate 128 respiratory rate 50.   On arrival to ED patient was febrile with temperature 103.3 tachycardic with heart rate in the low 100s, respiratory rate 40, hypotensive, and hypoxic requiring initiation of 15 L nonrebreather.  COVID swab positive.  Lab work significant for AKI with creatinine 2.70 BNP 141.4, high-sensitivity troponin 28, lactic 6.2, D-dimer 10.40.  Chest x-ray with multifocal airspace consolidation worrisome for multifocal pneumonia.  Given increased work of breathing and high O2 demand PCCM consulted for further management and admission  Pertinent  Medical History  Arthritis  Significant Hospital Events: Including procedures, antibiotic start and stop dates in addition to other pertinent events   7/29 admitted with SOB, COVID-positive with multifocal pneumonia seen on chest x-ray  Interim History / Subjective:   Patient's husband was at bedside. Stated she would like to be full code. He reports she had two covid vaccines.  Objective   Blood pressure 134/85, pulse (!) 105, temperature (!) 103.3 F (39.6 C), temperature source Axillary, resp. rate (!) 41, SpO2 (!) 89 %.        Intake/Output Summary (Last 24 hours) at 11/27/2021 1734 Last data filed at 11/27/2021  1706 Gross per 24 hour  Intake 2000 ml  Output --  Net 2000 ml   There were no vitals filed for this visit.  Examination: General: obese woman, in moderate distress, laying in bed HENT: [place]/AT, moist mucous membranes, sclera anicteric Lungs: diminished breath sounds, crackles posteriorly Cardiovascular: tachycardic, no murmur Abdomen: soft, non-tender, non-distended, BS+ Extremities: cold to touch lower extremities Neuro: A&O x 2, not oriented to time. Moving all extremities GU: n/a  Bedside Echo 7/29 Concerning for RV dilation  Resolved Hospital Problem list     Assessment & Plan:  Severe sepsis -Patient presented with tachycardia, tachypnea, fever, and positive lactic acid with acute concern for underlying infection including pneumonia P: Admit ICU Supplemental oxygen as below Pan cultures prior to antibiotic Continue IV ceftriaxone and azithromycin Gentle IV hydration Trend lactic acid Procalcitonin Monitor urine output  Multifocal pneumonia/COVID pneumonia with respiratory insufficiency -Received first 2 doses of COVID-vaccine P: Continue supplemental oxygen for SPO2 goal greater than 90 Continue Decadron  Start Remdesiver  VTE prophylaxis  Trend lactate  Cautious IV hydration Encourage pulmonary hygiene Check Pro-Cal Obtain extended RVP panel  Elevated D-dimer -D-dimer on admission 10.40, likely secondary to COVID-pneumonia P: Obtain formal echocardiogram, bedside echo concerning for enlarged RV Low threshold to start systemic heparin gtt Lower extremity Dopplers VQ scan in the setting of AKI  Elevated BNP and high-sensitivity troponin -Likely secondary to above P: Echo as above Strict intake and output Trend bnp and troponin Continuous telemetry  Acute kidney injury -Creatinine 2.70, GFR 19 on admission creatinine and GFR normal  2013 P: Follow renal function / urine output Trend Bmet Avoid nephrotoxins, ensure adequate renal perfusion  IV  hydration  DMII Mild hyperglycemia P: Check hemoglobin A1c Trend CBG Initiate SSI if indicated  Best Practice (right click and "Reselect all SmartList Selections" daily)   Diet/type: NPO DVT prophylaxis: systemic heparin GI prophylaxis: PPI Lines: N/A Foley:  N/A Code Status:  full code Last date of multidisciplinary goals of care discussion: Updated at bedside   Labs   CBC: Recent Labs  Lab 11/27/21 1550  WBC 6.6  NEUTROABS 6.2  HGB 15.0  HCT 45.2  MCV 91.9  PLT 172    Basic Metabolic Panel: Recent Labs  Lab 11/27/21 1550  NA 137  K 3.7  CL 99  CO2 23  GLUCOSE 195*  BUN 12  CREATININE 2.70*  CALCIUM 8.2*   GFR: CrCl cannot be calculated (Unknown ideal weight.). Recent Labs  Lab 11/27/21 1550  WBC 6.6  LATICACIDVEN 6.2*    Liver Function Tests: Recent Labs  Lab 11/27/21 1550  AST 41  ALT 24  ALKPHOS 67  BILITOT 0.7  PROT 6.7  ALBUMIN 3.1*   Recent Labs  Lab 11/27/21 1550  LIPASE 29   No results for input(s): "AMMONIA" in the last 168 hours.  ABG No results found for: "PHART", "PCO2ART", "PO2ART", "HCO3", "TCO2", "ACIDBASEDEF", "O2SAT"   Coagulation Profile: Recent Labs  Lab 11/27/21 1550  INR 1.1    Cardiac Enzymes: No results for input(s): "CKTOTAL", "CKMB", "CKMBINDEX", "TROPONINI" in the last 168 hours.  HbA1C: No results found for: "HGBA1C"  CBG: No results for input(s): "GLUCAP" in the last 168 hours.  Review of Systems:   Please see the history of present illness. All other systems reviewed and are negative   Past Medical History:  She,  has a past medical history of Arthritis.   Surgical History:  No past surgical history on file.   Social History:   reports that she has quit smoking. She has never used smokeless tobacco. She reports that she does not drink alcohol and does not use drugs.   Family History:  Her family history is not on file.   Allergies Allergies  Allergen Reactions   Amoxicillin-Pot  Clavulanate    Atorvastatin Other (See Comments)    SOB   Bactrim [Sulfamethoxazole-Trimethoprim] Other (See Comments)    Mouth will be like metal   Celebrex [Celecoxib] Diarrhea   Codeine Nausea And Vomiting   Dilaudid [Hydromorphone Hcl] Nausea And Vomiting   Hydrochlorothiazide Other (See Comments)    CANNOT VOID   Meloxicam Other (See Comments)    FLU LIKE SYMPTOMS   Morphine And Related Nausea And Vomiting   Tramadol    Wound Dressing Adhesive    Other Hives and Rash    emysin   Wellbutrin [Bupropion] Swelling and Rash     Home Medications  Prior to Admission medications   Medication Sig Start Date End Date Taking? Authorizing Provider  ALPRAZolam Prudy Feeler) 0.5 MG tablet Take 0.5 mg by mouth 2 (two) times daily as needed. For anxiety    [provider]  cetirizine (ZYRTEC) 10 MG chewable tablet Chew 10 mg by mouth daily.    [provider]  diazepam (VALIUM) 5 MG tablet Take 1 tablet before MRI scan for anxiety. May repeat if needed. 02/10/21   Cammy Copa, MD  Diclofenac-miSOPROStol 50-0.2 MG TBEC Take 50 mg by mouth 2 (two) times daily. 02/10/21   Cammy Copa, MD  glimepiride Yehuda Savannah)  4 MG tablet Take 4 mg by mouth daily. 11/04/20   [provider]  HYDROcodone-acetaminophen (NORCO/VICODIN) 5-325 MG tablet Take 1 tablet by mouth every 6 (six) hours as needed for moderate pain. 11/27/20   Hilts, Michael, MD  HYDROcodone-acetaminophen (NORCO/VICODIN) 5-325 MG tablet 1 po q hs prn pain 02/10/21   Cammy Copa, MD  losartan (COZAAR) 50 MG tablet  04/06/16   [provider]  metFORMIN (GLUCOPHAGE) 500 MG tablet  03/30/16   [provider]  TRULICITY 1.5 MG/0.5ML SOPN Inject into the skin. 11/23/20   [provider]  verapamil (CALAN-SR) 120 MG CR tablet  04/06/16   [provider]     Critical care time: 50 minutes   Melody Comas, MD [facility] Office:  [phone]   See Amion for personal pager PCCM on call pager [phone] until 7pm. Please call Elink 7p-7a. [phone]

## 2021-11-27 NOTE — Procedures (Addendum)
Arterial Catheter Insertion Procedure Note  YAZMEN BRIONES  [id number]  February 22, 1961  Date:11/27/21  Time:10:57 PM    Provider Performing: Charlotte Sanes    Procedure: Insertion of Arterial Line (40981) without US guidance  Indication(s) Blood pressure monitoring and/or need for frequent ABGs  Consent Risks of the procedure as well as the alternatives and risks of each were explained to the patient and/or caregiver.  Consent for the procedure was obtained and is signed in the bedside chart  Anesthesia Lidocaine 1%   Time Out Verified patient identification, verified procedure, site/side was marked, verified correct patient position, special equipment/implants available, medications/allergies/relevant history reviewed, required imaging and test results available.   Sterile Technique Maximal sterile technique including full sterile barrier drape, hand hygiene, sterile gown, sterile gloves, mask, hair covering, sterile ultrasound probe cover (if used).   Procedure Description Area of catheter insertion was cleaned with chlorhexidine and draped in sterile fashion. Without real-time ultrasound guidance an arterial catheter was placed into the right radial artery.  Appropriate arterial tracings confirmed on monitor.     Complications/Tolerance None; patient tolerated the procedure well. First two attempts unable to thread after good blood return, third attempt threaded easily.   EBL scant   Specimen(s) None

## 2021-11-27 NOTE — Progress Notes (Signed)
Pt transported from [facility] to [facility] without any complications. RN at bedside.

## 2021-11-27 NOTE — ED Notes (Signed)
MD notified of pt's SpO2 89-91% on high flow at 15L.

## 2021-11-27 NOTE — Progress Notes (Addendum)
ANTICOAGULATION CONSULT NOTE - Initial Consult  Pharmacy Consult for Heparin Indication: VTE treatment   Allergies  Allergen Reactions   Amoxicillin-Pot Clavulanate    Atorvastatin Other (See Comments)    SOB   Bactrim [Sulfamethoxazole-Trimethoprim] Other (See Comments)    Mouth will be like metal   Celebrex [Celecoxib] Diarrhea   Codeine Nausea And Vomiting   Dilaudid [Hydromorphone Hcl] Nausea And Vomiting   Hydrochlorothiazide Other (See Comments)    CANNOT VOID   Meloxicam Other (See Comments)    FLU LIKE SYMPTOMS   Morphine And Related Nausea And Vomiting   Tramadol    Wound Dressing Adhesive    Other Hives and Rash    emysin   Wellbutrin [Bupropion] Swelling and Rash    Patient Measurements:   Heparin Dosing Weight: 74.8kg  Vital Signs: Temp: 98.5 F (36.9 C) (07/29 1911) Temp Source: Oral (07/29 1911) BP: 90/66 (07/29 2015) Pulse Rate: 93 (07/29 2228)  Labs: Recent Labs    11/27/21 1550 11/27/21 1601 11/27/21 1738 11/27/21 1912  HGB 15.0  --  15.6* 13.7  HCT 45.2  --  46.0 41.1  PLT 172  --   --  153  APTT 34  --   --   --   LABPROT 14.3  --   --   --   INR 1.1  --   --   --   CREATININE 2.70*  --   --  2.61*  TROPONINIHS  --  28*  --  29*    CrCl cannot be calculated (Unknown ideal weight.).   Medical History: Past Medical History:  Diagnosis Date   Arthritis     Assessment: 61 yo female presented on 11/27/2021 for COVID positive with pneumonia. Pharmacy consulted to dose heparin for VTE treatment. D-dimer 10.4. VQ scan and doppler ordered. Patient is not on anticoagulation prior to admission. SQH given at 2208.   Goal of Therapy:  Heparin level 0.3-0.7 units/ml Monitor platelets by anticoagulation protocol: Yes   Plan:  Start heparin 1250 units/hr  No bolus with recent SQH administration Check 8 hr heparin level Monitor heparin level, CBC and s/s of bleeding daily  F/u VQ scan and doppler  Gerrit Halls, PharmD, BCPS Clinical  Pharmacist 11/27/2021 11:24 PM

## 2021-11-27 NOTE — Procedures (Signed)
Central Venous Catheter Insertion Procedure Note  Alejandra [name]  [id number]  21-Nov-1960  Date:11/27/21  Time:11:07 PM   Provider Performing:Estoria Geary Marcos Eke   Procedure: Insertion of Non-tunneled Central Venous Catheter(36556) without US guidance  Indication(s) Medication administration  Consent Risks of the procedure as well as the alternatives and risks of each were explained to the patient and/or caregiver.  Consent for the procedure was obtained and is signed in the bedside chart  Anesthesia Topical only with 1% lidocaine   Timeout Verified patient identification, verified procedure, site/side was marked, verified correct patient position, special equipment/implants available, medications/allergies/relevant history reviewed, required imaging and test results available.  Sterile Technique Maximal sterile technique including full sterile barrier drape, hand hygiene, sterile gown, sterile gloves, mask, hair covering, sterile ultrasound probe cover (if used).  Procedure Description Area of catheter insertion was cleaned with chlorhexidine and draped in sterile fashion.  With real-time ultrasound guidance a central venous catheter was placed into the right femoral vein. Nonpulsatile blood flow and easy flushing noted in all ports.  The catheter was sutured in place and sterile dressing applied. Placed in femoral vein with patient sitting up at 45 degrees due to respiratory distress.   Complications/Tolerance None; patient tolerated the procedure well. Chest X-ray is ordered to verify placement for internal jugular or subclavian cannulation.   Chest x-ray is not ordered for femoral cannulation.  EBL Minimal  Specimen(s) None

## 2021-11-27 NOTE — Progress Notes (Signed)
Placed patient on heated high flow [place] per order.

## 2021-11-27 NOTE — ED Notes (Signed)
RN unable to obtain accurate pt weight - pt is not on a bed w/ scale and pt is altered, unable to get pt up onto scale.

## 2021-11-27 NOTE — ED Notes (Signed)
RN called RT to place pt on high flow nasal cannula. RT to bedside, stated he believed pt was too altered and would be better off staying on NRB.

## 2021-11-27 NOTE — Sepsis Progress Note (Signed)
Sepsis protocol is being followed by eLink. 

## 2021-11-27 NOTE — Progress Notes (Signed)
eLink Physician-Brief Progress Note Patient Name: RYAN PALERMO DOB: 12/23/1960 MRN: [id number]   Date of Service  11/27/2021  HPI/Events of Note  61 yr old obese diabetic female presents with multifocal pneumonia, hypotension, and acute renal failure.  Recent family trip with multiple people COVID positive.  Patient COVID positive.  Presently on HFNC and needing levophed and vasopressin drip to maintain MAP.  eICU Interventions  Chart reviewed  Agree with treatment of severe CAP and COVID given septic picture presently.     Intervention Category Evaluation Type: New Patient Evaluation  Henry Russel, Demetrius Charity 11/27/2021, 8:58 PM

## 2021-11-27 NOTE — Progress Notes (Signed)
Pt. Has 3 working PIVs and 2 are USPIVs for vaso pressor , communicated with RN to endorse to ICU Nurse to attach IV watch soon as pt. Is transferred to the unit.

## 2021-11-27 NOTE — ED Provider Notes (Signed)
[facility] EMERGENCY DEPARTMENT Provider Note   CSN: [id number] Arrival date & time: 11/27/21  1524     History  Chief Complaint  Patient presents with   Altered Mental Status   Respiratory Distress    Alejandra [name] is a 61 y.o. female history of diabetes presenting to the emergency department with altered mental status.  The patient was recently at the beach with family, where everyone was ultimately exposed to COVID and tested positive.  Per husband, patient came back from the beach yesterday, and seemed confused and short of breath.  This worsened this morning so he called 911.  Patient reports cough, reports shortness of breath.  History otherwise limited due to patient's respiratory distress and mild confusion   Altered Mental Status      Home Medications Prior to Admission medications   Medication Sig Start Date End Date Taking? Authorizing Provider  ALPRAZolam Prudy Feeler) 0.5 MG tablet Take 0.5 mg by mouth 2 (two) times daily as needed. For anxiety    [provider]  cetirizine (ZYRTEC) 10 MG chewable tablet Chew 10 mg by mouth daily.    [provider]  diazepam (VALIUM) 5 MG tablet Take 1 tablet before MRI scan for anxiety. May repeat if needed. 02/10/21   Cammy Copa, MD  Diclofenac-miSOPROStol 50-0.2 MG TBEC Take 50 mg by mouth 2 (two) times daily. 02/10/21   Cammy Copa, MD  glimepiride (AMARYL) 4 MG tablet Take 4 mg by mouth daily. 11/04/20   [provider]  HYDROcodone-acetaminophen (NORCO/VICODIN) 5-325 MG tablet Take 1 tablet by mouth every 6 (six) hours as needed for moderate pain. 11/27/20   Hilts, Michael, MD  HYDROcodone-acetaminophen (NORCO/VICODIN) 5-325 MG tablet 1 po q hs prn pain 02/10/21   Cammy Copa, MD  losartan (COZAAR) 50 MG tablet  04/06/16   [provider]  metFORMIN (GLUCOPHAGE) 500 MG tablet  03/30/16   [provider]  TRULICITY 1.5 MG/0.5ML SOPN Inject into the  skin. 11/23/20   [provider]  verapamil (CALAN-SR) 120 MG CR tablet  04/06/16   [provider]      Allergies    Amoxicillin-pot clavulanate, Atorvastatin, Bactrim [sulfamethoxazole-trimethoprim], Celebrex [celecoxib], Codeine, Dilaudid [hydromorphone hcl], Hydrochlorothiazide, Meloxicam, Morphine and related, Tramadol, Wound dressing adhesive, Other, and Wellbutrin [bupropion]    Review of Systems   Review of Systems  Unable to perform ROS: Acuity of condition    Physical Exam Updated Vital Signs BP (!) 84/48   Pulse 96   Temp (!) 103.3 F (39.6 C) (Axillary)   Resp (!) 26   SpO2 95%  Physical Exam Vitals and nursing note reviewed.  Constitutional:      General: She is in acute distress.     Appearance: She is ill-appearing. She is not toxic-appearing.  HENT:     Head: Normocephalic and atraumatic.     Right Ear: External ear normal.     Left Ear: External ear normal.     Mouth/Throat:     Mouth: Mucous membranes are dry.  Eyes:     Conjunctiva/sclera: Conjunctivae normal.  Cardiovascular:     Rate and Rhythm: Regular rhythm. Tachycardia present.  Pulmonary:     Effort: Respiratory distress present.     Comments: Tachypnea with accessory muscle use, diffuse crackles Abdominal:     General: Abdomen is flat.     Palpations: Abdomen is soft.     Tenderness: There is no abdominal tenderness.  Musculoskeletal:  Cervical back: Neck supple.     Right lower leg: No edema.     Left lower leg: No edema.  Skin:    General: Skin is warm and dry.  Neurological:     Mental Status: She is alert.     Comments: Alert, follows commands, oriented to self, not to place or time  Psychiatric:        Mood and Affect: Mood normal.        Behavior: Behavior normal.     ED Results / Procedures / Treatments   Labs (all labs ordered are listed, but only abnormal results are displayed) Labs Reviewed  RESP PANEL BY RT-PCR (FLU A&B, COVID) ARPGX2 - Abnormal;  Notable for the following components:      Result Value   SARS Coronavirus 2 by RT PCR POSITIVE (*)    All other components within normal limits  LACTIC ACID, PLASMA - Abnormal; Notable for the following components:   Lactic Acid, Venous 6.2 (*)    All other components within normal limits  COMPREHENSIVE METABOLIC PANEL - Abnormal; Notable for the following components:   Glucose, Bld 195 (*)    Creatinine, Ser 2.70 (*)    Calcium 8.2 (*)    Albumin 3.1 (*)    GFR, Estimated 19 (*)    All other components within normal limits  CBC WITH DIFFERENTIAL/PLATELET - Abnormal; Notable for the following components:   Lymphs Abs 0.4 (*)    Monocytes Absolute 0.0 (*)    All other components within normal limits  D-DIMER, QUANTITATIVE - Abnormal; Notable for the following components:   D-Dimer, Quant 10.40 (*)    All other components within normal limits  BRAIN NATRIURETIC PEPTIDE - Abnormal; Notable for the following components:   B Natriuretic Peptide 141.4 (*)    All other components within normal limits  I-STAT VENOUS BLOOD GAS, ED - Abnormal; Notable for the following components:   Acid-base deficit 3.0 (*)    Calcium, Ion 1.03 (*)    Hemoglobin 15.6 (*)    All other components within normal limits  TROPONIN I (HIGH SENSITIVITY) - Abnormal; Notable for the following components:   Troponin I (High Sensitivity) 28 (*)    All other components within normal limits  CULTURE, BLOOD (ROUTINE X 2)  CULTURE, BLOOD (ROUTINE X 2)  URINE CULTURE  RESPIRATORY PANEL BY PCR  PROTIME-INR  APTT  LIPASE, BLOOD  LACTIC ACID, PLASMA  URINALYSIS, ROUTINE W REFLEX MICROSCOPIC  HIV ANTIBODY (ROUTINE TESTING W REFLEX)  CBC  CREATININE, SERUM  PROCALCITONIN  CBC WITH DIFFERENTIAL/PLATELET  COMPREHENSIVE METABOLIC PANEL  MAGNESIUM  PHOSPHORUS  LACTIC ACID, PLASMA  LACTIC ACID, PLASMA  LEGIONELLA PNEUMOPHILA SEROGP 1 UR AG  STREP PNEUMONIAE URINARY ANTIGEN  RAPID URINE DRUG SCREEN, HOSP PERFORMED   HEMOGLOBIN A1C  TROPONIN I (HIGH SENSITIVITY)    EKG EKG Interpretation  Date/Time:  Saturday November 27 2021 15:33:53 EDT Ventricular Rate:  117 PR Interval:  158 QRS Duration: 79 QT Interval:  321 QTC Calculation: 448 R Axis:   52 Text Interpretation: Sinus tachycardia Confirmed by Alvino Blood ([id number]) on 11/27/2021 6:09:55 PM  Radiology DG Chest Port 1 View  Result Date: 11/27/2021 CLINICAL DATA:  Questionable sepsis.  COVID. EXAM: PORTABLE CHEST 1 VIEW COMPARISON:  Chest x-ray 08/06/2008 FINDINGS: There is multifocal airspace consolidation throughout both lungs most significant in the left upper lobe. There is no pleural effusion or pneumothorax. Cardiomediastinal silhouette is within normal limits. No acute fractures are seen. IMPRESSION:  1. Multifocal airspace consolidation, most significant in the left upper lobe, worrisome for multifocal pneumonia. Follow-up imaging recommended in 4-6 weeks to confirm complete resolution. Electronically Signed   By: Darliss Cheney M.D.   On: 11/27/2021 16:07    Procedures .Critical Care  Performed by: Lonell Grandchild, MD Authorized by: Lonell Grandchild, MD   Critical care provider statement:    Critical care time (minutes):  30   Critical care start time:  11/27/2021 6:00 PM   Critical care end time:  11/27/2021 6:32 PM   Critical care time was exclusive of:  Separately billable procedures and treating other patients   Critical care was necessary to treat or prevent imminent or life-threatening deterioration of the following conditions:  Cardiac failure and respiratory failure   Critical care was time spent personally by me on the following activities:  Development of treatment plan with patient or surrogate, discussions with consultants, evaluation of patient's response to treatment, examination of patient, ordering and review of laboratory studies, ordering and review of radiographic studies, ordering and performing treatments and  interventions, pulse oximetry, re-evaluation of patient's condition, review of old charts and obtaining history from patient or surrogate   Care discussed with: admitting provider       Medications Ordered in ED Medications  lactated ringers infusion ( Intravenous New Bag/Given 11/27/21 1818)  heparin injection 5,000 Units (has no administration in time range)  remdesivir 200 mg in sodium chloride 0.9% 250 mL IVPB (has no administration in time range)    Followed by  remdesivir 100 mg in sodium chloride 0.9 % 100 mL IVPB (has no administration in time range)  dexamethasone (DECADRON) injection 6 mg (has no administration in time range)  cefTRIAXone (ROCEPHIN) 2 g in sodium chloride 0.9 % 100 mL IVPB (has no administration in time range)  azithromycin (ZITHROMAX) 500 mg in sodium chloride 0.9 % 250 mL IVPB (has no administration in time range)    Followed by  azithromycin (ZITHROMAX) 250 mg in dextrose 5 % 125 mL IVPB (has no administration in time range)  insulin aspart (novoLOG) injection 0-15 Units (has no administration in time range)  0.9 %  sodium chloride infusion (has no administration in time range)  norepinephrine (LEVOPHED) 4mg  in (0.016 mg/mL) premix infusion (10 mcg/min Intravenous New Bag/Given 11/27/21 1850)  lactated ringers bolus 1,000 mL (0 mLs Intravenous Stopped 11/27/21 1705)    And  lactated ringers bolus 1,000 mL (0 mLs Intravenous Stopped 11/27/21 1706)    And  lactated ringers bolus 500 mL (0 mLs Intravenous Stopped 11/27/21 1742)  levofloxacin (LEVAQUIN) IVPB 750 mg (0 mg Intravenous Stopped 11/27/21 1818)  acetaminophen (OFIRMEV) IV 1,000 mg (0 mg Intravenous Stopped 11/27/21 1706)    ED Course/ Medical Decision Making/ A&P Clinical Course as of 11/27/21 1853  Sat Nov 27, 2021  1658 I-Stat venous blood gas, [facility] ED only) [WS]  1720 Discussed with intensivist team who will evaluate the patient. [WS]    Clinical Course User Index [WS] [facility], MD                            Medical Decision Making Amount and/or Complexity of Data Reviewed Labs: ordered. Decision-making details documented in ED Course. Radiology: ordered. ECG/medicine tests: ordered.  Risk Prescription drug management. Decision regarding hospitalization.     61 year old female presenting to the emergency department with confusion.  On arrival, patient hypoxic on 15 L nonrebreather,  febrile, tachycardic.  Patient appears in respiratory distress, chest x-ray demonstrates multifocal pneumonia, possibly COVID-pneumonia given recent positive COVID test at home.  Treated empirically for pneumonia.  No sign of pneumothorax.  VBG without sign of CO2 retention.  Blood cultures obtained given Fever, tachycardia, hypotension concerning for septic shock.  Patient became hypotensive and started on norepinephrine.  Bedside ultrasound with collapsible IVC suggestive of hypovolemia, fluid resuscitation initiated.  Patient had D-dimer checked which was elevated, possibly due to acute infectious process versus PE, due to elevated creatinine patient will need VQ scan.  Initiated on high flow nasal cannula due to tenuous respiratory status.  Mentation improved with high flow nasal cannula so intubation deferred.  Discussed with patient's husband.  Discussed with ICU.  I interpreted the chest x-ray independently which on my read was concerning for multifocal pneumonia.         Final Clinical Impression(s) / ED Diagnoses Final diagnoses:  Acute respiratory failure with hypoxia (HCC)  Pneumonia due to COVID-19 virus  Sepsis, due to unspecified organism, unspecified whether acute organ dysfunction present (HCC)  Acute kidney injury (HCC)  Hypotension, unspecified hypotension type    Rx / DC Orders ED Discharge Orders     None         Lonell Grandchild, MD 11/27/21 [phone]

## 2021-11-27 NOTE — ED Triage Notes (Signed)
Pt arrived via [facility] from home for AMS since this morning w/ shob. Pt was at the beach w/ family last weekend where they all got COVID. Pt returned home last night and was acting normal per husband, stated she had  some body aches. This morning pt was not responding to husband. Per EMS pt RA SpO2 was 78%, 120HR, 50RR, last BP was 90/50. Ems put pt on NRB at 15L, SpO2 increased to 94%. Pt is responsive to pain, does not follow commands, is not speaking. WOB increased upon arrival, hot to touch.

## 2021-11-27 NOTE — ED Notes (Signed)
Pt has had increase in WOB, RR has increased into 40s. MD Scheving notified.

## 2021-11-28 ENCOUNTER — Inpatient Hospital Stay (HOSPITAL_COMMUNITY): Payer: 59

## 2021-11-28 DIAGNOSIS — R079 Chest pain, unspecified: Secondary | ICD-10-CM

## 2021-11-28 DIAGNOSIS — U071 COVID-19: Secondary | ICD-10-CM | POA: Diagnosis not present

## 2021-11-28 DIAGNOSIS — J1282 Pneumonia due to coronavirus disease 2019: Secondary | ICD-10-CM | POA: Diagnosis not present

## 2021-11-28 LAB — COMPREHENSIVE METABOLIC PANEL
ALT: 26 U/L (ref 0–44)
AST: 54 U/L — ABNORMAL HIGH (ref 15–41)
Albumin: 2.5 g/dL — ABNORMAL LOW (ref 3.5–5.0)
Alkaline Phosphatase: 50 U/L (ref 38–126)
Anion gap: 13 (ref 5–15)
BUN: 20 mg/dL (ref 8–23)
CO2: 19 mmol/L — ABNORMAL LOW (ref 22–32)
Calcium: 7.2 mg/dL — ABNORMAL LOW (ref 8.9–10.3)
Chloride: 100 mmol/L (ref 98–111)
Creatinine, Ser: 1.66 mg/dL — ABNORMAL HIGH (ref 0.44–1.00)
GFR, Estimated: 35 mL/min — ABNORMAL LOW (ref >60)
Glucose, Bld: 310 mg/dL — ABNORMAL HIGH (ref 70–99)
Potassium: 3.8 mmol/L (ref 3.5–5.1)
Sodium: 132 mmol/L — ABNORMAL LOW (ref 135–145)
Total Bilirubin: 0.5 mg/dL (ref 0.3–1.2)
Total Protein: 5.8 g/dL — ABNORMAL LOW (ref 6.5–8.1)

## 2021-11-28 LAB — CBC WITH DIFFERENTIAL/PLATELET
Abs Immature Granulocytes: 1.4 10*3/uL — ABNORMAL HIGH (ref 0.00–0.07)
Basophils Absolute: 0 10*3/uL (ref 0.0–0.1)
Basophils Relative: 0 %
Eosinophils Absolute: 0 10*3/uL (ref 0.0–0.5)
Eosinophils Relative: 0 %
HCT: 38.7 % (ref 36.0–46.0)
Hemoglobin: 13.5 g/dL (ref 12.0–15.0)
Lymphocytes Relative: 1 %
Lymphs Abs: 0.2 10*3/uL — ABNORMAL LOW (ref 0.7–4.0)
MCH: 30.8 pg (ref 26.0–34.0)
MCHC: 34.9 g/dL (ref 30.0–36.0)
MCV: 88.4 fL (ref 80.0–100.0)
Metamyelocytes Relative: 6 %
Monocytes Absolute: 0.2 10*3/uL (ref 0.1–1.0)
Monocytes Relative: 1 %
Myelocytes: 3 %
Neutro Abs: 14.1 10*3/uL — ABNORMAL HIGH (ref 1.7–7.7)
Neutrophils Relative %: 89 %
Platelets: 150 10*3/uL (ref 150–400)
RBC: 4.38 MIL/uL (ref 3.87–5.11)
RDW: 13.7 % (ref 11.5–15.5)
WBC: 15.8 10*3/uL — ABNORMAL HIGH (ref 4.0–10.5)
nRBC: 0 % (ref 0.0–0.2)
nRBC: 0 /100 WBC

## 2021-11-28 LAB — RESPIRATORY PANEL BY PCR

## 2021-11-28 LAB — TROPONIN I (HIGH SENSITIVITY)
Troponin I (High Sensitivity): 1609 ng/L (ref <18)
Troponin I (High Sensitivity): 1783 ng/L (ref <18)
Troponin I (High Sensitivity): 28 ng/L — ABNORMAL HIGH (ref <18)
Troponin I (High Sensitivity): 2836 ng/L (ref <18)

## 2021-11-28 LAB — ECHOCARDIOGRAM LIMITED
Area-P 1/2: 4.31 cm2
Height: 62 in
S' Lateral: 3 cm
Single Plane A4C EF: 41.9 %
Weight: 3643.76 oz

## 2021-11-28 LAB — MAGNESIUM: Magnesium: 1 mg/dL — ABNORMAL LOW (ref 1.7–2.4)

## 2021-11-28 LAB — GLUCOSE, CAPILLARY
Glucose-Capillary: 297 mg/dL — ABNORMAL HIGH (ref 70–99)
Glucose-Capillary: 232 mg/dL — ABNORMAL HIGH (ref 70–99)
Glucose-Capillary: 219 mg/dL — ABNORMAL HIGH (ref 70–99)
Glucose-Capillary: 177 mg/dL — ABNORMAL HIGH (ref 70–99)
Glucose-Capillary: 175 mg/dL — ABNORMAL HIGH (ref 70–99)

## 2021-11-28 LAB — HEPARIN LEVEL (UNFRACTIONATED)
Heparin Unfractionated: 0.35 IU/mL (ref 0.30–0.70)
Heparin Unfractionated: 0.25 IU/mL — ABNORMAL LOW (ref 0.30–0.70)

## 2021-11-28 LAB — LACTIC ACID, PLASMA: Lactic Acid, Venous: 3.5 mmol/L (ref 0.5–1.9)

## 2021-11-28 LAB — PHOSPHORUS: Phosphorus: 3.6 mg/dL (ref 2.5–4.6)

## 2021-11-28 LAB — PROCALCITONIN: Procalcitonin: 137.5 ng/mL

## 2021-11-28 MED ORDER — ORAL CARE MOUTH RINSE
15.0000 mL | OROMUCOSAL | Status: DC
  Administered 2021-11-28 – 2021-12-05 (×28): 15 mL via OROMUCOSAL

## 2021-11-28 MED ORDER — LINEZOLID 600 MG/300ML IV SOLN
600.0000 mg | Freq: Two times a day (BID) | INTRAVENOUS | Status: DC
  Administered 2021-11-28 – 2021-11-29 (×3): 600 mg via INTRAVENOUS
  Filled 2021-11-28 (×3): qty 300

## 2021-11-28 MED ORDER — HYDROCODONE-ACETAMINOPHEN 5-325 MG PO TABS
1.0000 | ORAL_TABLET | ORAL | Status: DC | PRN
  Administered 2021-11-28 – 2021-12-06 (×23): 1 via ORAL
  Filled 2021-11-28 (×24): qty 1

## 2021-11-28 MED ORDER — ONDANSETRON HCL 4 MG/2ML IJ SOLN
4.0000 mg | Freq: Three times a day (TID) | INTRAMUSCULAR | Status: DC | PRN
  Administered 2021-11-28 – 2021-11-29 (×2): 4 mg via INTRAVENOUS
  Filled 2021-11-28 (×2): qty 2

## 2021-11-28 MED ORDER — ASPIRIN 325 MG PO TABS
325.0000 mg | ORAL_TABLET | Freq: Once | ORAL | Status: AC
  Administered 2021-11-28: 325 mg via ORAL
  Filled 2021-11-28: qty 1

## 2021-11-28 MED ORDER — ORAL CARE MOUTH RINSE: 15.0000 mL | OROMUCOSAL | Status: DC | PRN

## 2021-11-28 MED ORDER — INSULIN GLARGINE-YFGN 100 UNIT/ML ~~LOC~~ SOLN
10.0000 [IU] | Freq: Every day | SUBCUTANEOUS | Status: DC
  Administered 2021-11-28: 10 [IU] via SUBCUTANEOUS
  Filled 2021-11-28 (×2): qty 0.1

## 2021-11-28 MED ORDER — TECHNETIUM TO 99M ALBUMIN AGGREGATED
4.4000 | Freq: Once | INTRAVENOUS | Status: AC | PRN
  Administered 2021-11-28: 4.4 via INTRAVENOUS

## 2021-11-28 MED ORDER — ASPIRIN 81 MG PO TBEC
81.0000 mg | DELAYED_RELEASE_TABLET | Freq: Every day | ORAL | Status: DC
  Administered 2021-11-29 – 2021-12-06 (×8): 81 mg via ORAL
  Filled 2021-11-28 (×8): qty 1

## 2021-11-28 MED ORDER — MAGNESIUM SULFATE 2 GM/50ML IV SOLN
2.0000 g | Freq: Once | INTRAVENOUS | Status: AC
  Administered 2021-11-28: 2 g via INTRAVENOUS
  Filled 2021-11-28: qty 50

## 2021-11-28 NOTE — Progress Notes (Signed)
RN placed patient back on BIPAP at this time.

## 2021-11-28 NOTE — Progress Notes (Signed)
14:48: Pt complaining of some chest discomfort primarily under the left breast. EKG completed and troponin.   Contacted appropriate individuals in order to complete ECHO and V/Q Scan.   19:00 V/Q scan complete, Echo complete.

## 2021-11-28 NOTE — Progress Notes (Signed)
NAME:  Alejandra [name], MRN:  [id number], DOB:  1961/03/20, LOS: 1 ADMISSION DATE:  11/27/2021, CONSULTATION DATE:  11/27/2021 REFERRING MD:  Dr. Suezanne Jacquet - ED, CHIEF COMPLAINT:  Sepsis    History of Present Illness:  Alejandra [name] is a 61 y.o. female with a PMH significant for arthritis, DMII, obesity and OSA non-compliant with CPAP who presented to the ED with SOB.  Reports recently returned from trip with family and majority of people traveling with her tested positive for COVID.  She became symptomatic 7/25 and quarantined at her son's house until last evening when she came home to her husband. He reports she had altered mentation and he called EMS today. Per EMS on room air patient was satting 70% with heart rate 128 respiratory rate 50.   On arrival to ED patient was febrile with temperature 103.3 tachycardic with heart rate in the low 100s, respiratory rate 40, hypotensive, and hypoxic requiring initiation of 15 L nonrebreather.  COVID swab positive.  Lab work significant for AKI with creatinine 2.70 BNP 141.4, high-sensitivity troponin 28, lactic 6.2, D-dimer 10.40.  Chest x-ray with multifocal airspace consolidation worrisome for multifocal pneumonia.  Given increased work of breathing and high O2 demand PCCM consulted for further management and admission  Pertinent  Medical History  Arthritis  Significant Hospital Events: Including procedures, antibiotic start and stop dates in addition to other pertinent events   7/29 admitted with SOB, COVID-positive with multifocal pneumonia seen on chest x-ray  Interim History / Subjective:   Patient placed on Bipap overnight, unable to tolerate coming off this morning due to work of breathing.  No new complaints at this time  Objective   Blood pressure 129/73, pulse 88, temperature 98.3 F (36.8 C), temperature source Axillary, resp. rate (!) 33, height 5\' 2"  (1.575 m), weight 103.3 kg, SpO2 97 %.    FiO2 (%):  [100 %] 100 %    Intake/Output Summary (Last 24 hours) at 11/28/2021 0727 Last data filed at 11/28/2021 11/30/2021 Gross per 24 hour  Intake 4598.05 ml  Output 590 ml  Net 4008.05 ml   Filed Weights   11/27/21 2300 11/28/21 0229  Weight: 103.3 kg 103.3 kg    Examination: General: obese woman, in moderate distress, laying in bed, bipap in place HENT: [place]/AT, moist mucous membranes, sclera anicteric Lungs: diminished breath sounds, crackles posteriorly Cardiovascular: tachycardic, no murmur Abdomen: soft, non-tender, non-distended, BS+ Extremities: no edema, warm Neuro: A&O x 3, Moving all extremities GU: n/a  Resolved Hospital Problem list     Assessment & Plan:  Severe sepsis -Patient presented with tachycardia, tachypnea, fever, and positive lactic acid with acute concern for underlying infection including pneumonia P: Supplemental oxygen as below Follow up cultures Continue IV ceftriaxone, azithromycin and add linezolid as MRSA screen is positive Trend lactic acid  Acute Hypoxemic Respiratory Failure Multifocal pneumonia/COVID pneumonia  -Received first 2 doses of COVID-vaccine P: Continue supplemental oxygen for SPO2 goal greater than 90 Continue Decadron  Continue Remdesiver  VTE prophylaxis  Trend lactate   Elevated D-dimer -D-dimer on admission 10.40, likely secondary to COVID-pneumonia P: Obtain formal echocardiogram, bedside echo concerning for enlarged RV Low threshold to start systemic heparin gtt Lower extremity Dopplers VQ scan in the setting of AKI  Elevated BNP and high-sensitivity troponin -Likely secondary to above P: Echo as above Strict intake and output Trend bnp and troponin Continuous telemetry  Acute kidney injury -Creatinine 2.70, GFR 19 on admission creatinine and GFR normal 2013 P:  Follow renal function / urine output Trend Bmet Avoid nephrotoxins, ensure adequate renal perfusion  IV hydration  DMII Mild hyperglycemia P: Check hemoglobin  A1c Trend CBG Initiate SSI if indicated  Best Practice (right click and "Reselect all SmartList Selections" daily)   Diet/type: NPO DVT prophylaxis: prophylactic heparin  GI prophylaxis: PPI Lines: N/A Foley:  N/A Code Status:  full code Last date of multidisciplinary goals of care discussion: Updated at bedside   Labs   CBC: Recent Labs  Lab 11/27/21 1550 11/27/21 1738 11/27/21 1912 11/28/21 0406  WBC 6.6  --  10.4 15.8*  NEUTROABS 6.2  --   --  14.1*  HGB 15.0 15.6* 13.7 13.5  HCT 45.2 46.0 41.1 38.7  MCV 91.9  --  91.9 88.4  PLT 172  --  153 150    Basic Metabolic Panel: Recent Labs  Lab 11/27/21 1550 11/27/21 1738 11/27/21 1912 11/28/21 0406  NA 137 137  --  132*  K 3.7 3.7  --  3.8  CL 99  --   --  100  CO2 23  --   --  19*  GLUCOSE 195*  --   --  310*  BUN 12  --   --  20  CREATININE 2.70*  --  2.61* 1.66*  CALCIUM 8.2*  --   --  7.2*  MG  --   --   --  1.0*  PHOS  --   --   --  3.6   GFR: Estimated Creatinine Clearance: 39.6 mL/min (A) (by C-G formula based on SCr of 1.66 mg/dL (H)). Recent Labs  Lab 11/27/21 1550 11/27/21 1912 11/27/21 2306 11/28/21 0406  PROCALCITON  --  >150.00  --  137.50  WBC 6.6 10.4  --  15.8*  LATICACIDVEN 6.2* 5.2* 4.0*  --     Liver Function Tests: Recent Labs  Lab 11/27/21 1550 11/28/21 0406  AST 41 54*  ALT 24 26  ALKPHOS 67 50  BILITOT 0.7 0.5  PROT 6.7 5.8*  ALBUMIN 3.1* 2.5*   Recent Labs  Lab 11/27/21 1550  LIPASE 29   No results for input(s): "AMMONIA" in the last 168 hours.  ABG    Component Value Date/Time   HCO3 25.1 11/27/2021 1738   TCO2 27 11/27/2021 1738   ACIDBASEDEF 3.0 (H) 11/27/2021 1738   O2SAT 71 11/27/2021 1738     Coagulation Profile: Recent Labs  Lab 11/27/21 1550  INR 1.1    Cardiac Enzymes: No results for input(s): "CKTOTAL", "CKMB", "CKMBINDEX", "TROPONINI" in the last 168 hours.  HbA1C: Hgb A1c MFr Bld  Date/Time Value Ref Range Status  11/27/2021 07:12 PM  7.2 (H) 4.8 - 5.6 % Final    Comment:    (NOTE) Pre diabetes:          5.7%-6.4%  Diabetes:              >6.4%  Glycemic control for   <7.0% adults with diabetes     CBG: Recent Labs  Lab 11/27/21 2027 11/27/21 2335 11/28/21 0404  GLUCAP 187* 261* 297*    Critical care time: 40 minutes   Melody Comas, MD [facility] Office: [phone]   See Amion for personal pager PCCM on call pager [phone] until 7pm. Please call Elink 7p-7a. [phone]

## 2021-11-28 NOTE — Progress Notes (Signed)
ANTICOAGULATION CONSULT NOTE   Pharmacy Consult for Heparin Indication: VTE treatment   Allergies  Allergen Reactions   Amoxicillin-Pot Clavulanate    Atorvastatin Other (See Comments)    SOB   Bactrim [Sulfamethoxazole-Trimethoprim] Other (See Comments)    Mouth will be like metal   Celebrex [Celecoxib] Diarrhea   Codeine Nausea And Vomiting   Dilaudid [Hydromorphone Hcl] Nausea And Vomiting   Hydrochlorothiazide Other (See Comments)    CANNOT VOID   Meloxicam Other (See Comments)    FLU LIKE SYMPTOMS   Morphine And Related Nausea And Vomiting   Tramadol    Wound Dressing Adhesive    Other Hives and Rash    emysin   Wellbutrin [Bupropion] Swelling and Rash    Patient Measurements: Height: 5\' 2"  (157.5 cm) Weight: 103.3 kg (227 lb 11.8 oz) IBW/kg (Calculated) : 50.1 Heparin Dosing Weight: 74.8kg  Vital Signs: Temp: 98.2 F (36.8 C) (07/30 0755) Temp Source: Axillary (07/30 0755) BP: 111/60 (07/30 0850) Pulse Rate: 93 (07/30 0915)  Labs: Recent Labs    11/27/21 1550 11/27/21 1601 11/27/21 1738 11/27/21 1912 11/28/21 0000 11/28/21 0406 11/28/21 0848  HGB 15.0  --  15.6* 13.7  --  13.5  --   HCT 45.2  --  46.0 41.1  --  38.7  --   PLT 172  --   --  153  --  150  --   APTT 34  --   --   --   --   --   --   LABPROT 14.3  --   --   --   --   --   --   INR 1.1  --   --   --   --   --   --   HEPARINUNFRC  --   --   --   --   --   --  0.35  CREATININE 2.70*  --   --  2.61*  --  1.66*  --   TROPONINIHS  --  28*  --  29* 28*  --   --     Estimated Creatinine Clearance: 39.6 mL/min (A) (by C-G formula based on SCr of 1.66 mg/dL (H)).   Medical History: Past Medical History:  Diagnosis Date   Arthritis     Assessment: 61 yo female presented on 11/27/2021 for COVID positive with pneumonia. Pharmacy consulted to dose heparin for VTE treatment. D-dimer 10.4. VQ scan and doppler ordered. Patient is not on anticoagulation prior to admission. SQH given at 2208.    Heparin level 0.35 on current rate of 1250 units/hr. Therapeutic but at low end of goal.  No bleeding reported. CBC stable.   Goal of Therapy:  Heparin level 0.3-0.7 units/ml Monitor platelets by anticoagulation protocol: Yes   Plan:  Increase  heparin 1350 units/hr to keep in goal Recheck in 6-8 hours to ensure remains at goal Monitor heparin level, CBC and s/s of bleeding daily  F/u VQ scan and doppler  2209, PharmD, BCPS, BCCCP Clinical Pharmacist Please refer to [facility] for [facility] Pharmacy numbers 11/28/2021 10:18 AM

## 2021-11-28 NOTE — Progress Notes (Signed)
eLink Physician-Brief Progress Note Patient Name: Alejandra [name] DOB: 10/11/1960 MRN: [id number]   Date of Service  11/28/2021  HPI/Events of Note  Low magnesium Urinary retention and vasopressor needs Rising trop.  No chest pain presently.  Nonspecific changes on EKG.  Echo w/o RWMA.  V/q read as indeterminate.  On heparin and has received aspirin.   On Bipap and reasonably calm at the moment on 80% FIO2.  eICU Interventions  Replace mag Place foley Continue heparin drip Monitor resp status closely.  At high risk of needing intubation     Intervention Category Major Interventions: Respiratory failure - evaluation and management Intermediate Interventions: Electrolyte abnormality - evaluation and management  Henry Russel, P 11/28/2021, 8:33 PM

## 2021-11-28 NOTE — Progress Notes (Signed)
NUTRITION NOTE  Consult received for initiation and management of tube feeding. Able to talk with RN via secure chat. No NGT in place and RN shares that there is not currently a plan for placement.   Patient is NPO. She has required BiPAP throughout the shift.  Will continue to monitor medical course and plan concerning nutrition.     Trenton Gammon, MS, RD, LDN, CNSC Registered Dietitian II Inpatient Clinical Nutrition RD pager # and on-call/weekend pager # available in [facility]

## 2021-11-28 NOTE — Progress Notes (Signed)
Pt transported back from Nuc Med without complications at this time.

## 2021-11-28 NOTE — Plan of Care (Signed)
  Problem: Education: Goal: Knowledge of risk factors and measures for prevention of condition will improve Outcome: Progressing   Problem: Coping: Goal: Psychosocial and spiritual needs will be supported Outcome: Progressing   Problem: Education: Goal: Knowledge of General Education information will improve Description: Including pain rating scale, medication(s)/side effects and non-pharmacologic comfort measures Outcome: Progressing   Problem: Coping: Goal: Level of anxiety will decrease Outcome: Progressing

## 2021-11-28 NOTE — Progress Notes (Signed)
ANTICOAGULATION CONSULT NOTE   Pharmacy Consult for Heparin Indication: R/O VTE   Allergies  Allergen Reactions   Atorvastatin Other (See Comments)    SOB   Bactrim [Sulfamethoxazole-Trimethoprim] Other (See Comments)    Mouth will be like metal   Celebrex [Celecoxib] Diarrhea   Codeine Nausea And Vomiting   Dilaudid [Hydromorphone Hcl] Nausea And Vomiting   Hydrochlorothiazide Other (See Comments)    CANNOT VOID   Meloxicam Other (See Comments)    FLU LIKE SYMPTOMS   Morphine And Related Nausea And Vomiting   Tramadol     unknown   Wound Dressing Adhesive    Amoxicillin-Pot Clavulanate Diarrhea   Other Hives and Rash    emysin   Wellbutrin [Bupropion] Swelling and Rash    Patient Measurements: Height: 5\' 2"  (157.5 cm) Weight: 103.3 kg (227 lb 11.8 oz) IBW/kg (Calculated) : 50.1 Heparin Dosing Weight: 74.8kg  Vital Signs: Temp: 98.1 F (36.7 C) (07/30 1139) Temp Source: Axillary (07/30 1139) BP: 103/56 (07/30 1925) Pulse Rate: 90 (07/30 1925)  Labs: Recent Labs    11/27/21 1550 11/27/21 1601 11/27/21 1738 11/27/21 1912 11/28/21 0000 11/28/21 0406 11/28/21 0848 11/28/21 1451 11/28/21 1657  HGB 15.0  --  15.6* 13.7  --  13.5  --   --   --   HCT 45.2  --  46.0 41.1  --  38.7  --   --   --   PLT 172  --   --  153  --  150  --   --   --   APTT 34  --   --   --   --   --   --   --   --   LABPROT 14.3  --   --   --   --   --   --   --   --   INR 1.1  --   --   --   --   --   --   --   --   HEPARINUNFRC  --   --   --   --   --   --  0.35  --  0.25*  CREATININE 2.70*  --   --  2.61*  --  1.66*  --   --   --   TROPONINIHS  --    < >  --  29* 28*  --   --  1,609*  --    < > = values in this interval not displayed.     Estimated Creatinine Clearance: 39.6 mL/min (A) (by C-G formula based on SCr of 1.66 mg/dL (H)).   Medical History: Past Medical History:  Diagnosis Date   Arthritis     Assessment: 61 yo female presented on 11/27/2021 for COVID positive  with pneumonia. Pharmacy consulted to dose heparin for VTE treatment. D-dimer 10.4. VQ scan and doppler ordered. Patient is not on anticoagulation prior to admission. SQH given at 2208.   Heparin level 0.25 fell to < goal despite heparin drip rate increased 1350 units/hr.  No bleeding reported. CBC stable.   Goal of Therapy:  Heparin level 0.3-0.7 units/ml Monitor platelets by anticoagulation protocol: Yes   Plan:  Increase  heparin 1500 units/hr  Monitor heparin level, CBC and s/s of bleeding daily  F/u VQ scan and doppler    2209 Pharm.D. CPP, BCPS Clinical Pharmacist [phone] 11/28/2021 7:30 PM   Please refer to [facility] for [facility] Pharmacy numbers 11/28/2021 7:28 PM

## 2021-11-28 NOTE — Progress Notes (Signed)
  Echocardiogram 2D Echocardiogram has been performed.  Delcie Roch 11/28/2021, 4:19 PM

## 2021-11-29 ENCOUNTER — Encounter (HOSPITAL_COMMUNITY): Payer: Self-pay | Admitting: Pulmonary Disease

## 2021-11-29 ENCOUNTER — Inpatient Hospital Stay (HOSPITAL_COMMUNITY): Payer: 59

## 2021-11-29 DIAGNOSIS — J1282 Pneumonia due to coronavirus disease 2019: Secondary | ICD-10-CM | POA: Diagnosis not present

## 2021-11-29 DIAGNOSIS — U071 COVID-19: Secondary | ICD-10-CM

## 2021-11-29 DIAGNOSIS — R7989 Other specified abnormal findings of blood chemistry: Secondary | ICD-10-CM

## 2021-11-29 LAB — COMPREHENSIVE METABOLIC PANEL
ALT: 27 U/L (ref 0–44)
AST: 55 U/L — ABNORMAL HIGH (ref 15–41)
Albumin: 2.3 g/dL — ABNORMAL LOW (ref 3.5–5.0)
Alkaline Phosphatase: 52 U/L (ref 38–126)
Anion gap: 10 (ref 5–15)
BUN: 24 mg/dL — ABNORMAL HIGH (ref 8–23)
CO2: 23 mmol/L (ref 22–32)
Calcium: 6.9 mg/dL — ABNORMAL LOW (ref 8.9–10.3)
Chloride: 98 mmol/L (ref 98–111)
Creatinine, Ser: 0.94 mg/dL (ref 0.44–1.00)
GFR, Estimated: >60 mL/min (ref >60)
Glucose, Bld: 223 mg/dL — ABNORMAL HIGH (ref 70–99)
Potassium: 3.6 mmol/L (ref 3.5–5.1)
Sodium: 131 mmol/L — ABNORMAL LOW (ref 135–145)
Total Bilirubin: 0.8 mg/dL (ref 0.3–1.2)
Total Protein: 5.7 g/dL — ABNORMAL LOW (ref 6.5–8.1)

## 2021-11-29 LAB — CBC WITH DIFFERENTIAL/PLATELET
Abs Immature Granulocytes: 0.31 10*3/uL — ABNORMAL HIGH (ref 0.00–0.07)
Basophils Absolute: 0.1 10*3/uL (ref 0.0–0.1)
Basophils Relative: 0 %
Eosinophils Absolute: 0 10*3/uL (ref 0.0–0.5)
Eosinophils Relative: 0 %
HCT: 34.8 % — ABNORMAL LOW (ref 36.0–46.0)
Hemoglobin: 12.1 g/dL (ref 12.0–15.0)
Immature Granulocytes: 2 %
Lymphocytes Relative: 4 %
Lymphs Abs: 0.6 10*3/uL — ABNORMAL LOW (ref 0.7–4.0)
MCH: 30.8 pg (ref 26.0–34.0)
MCHC: 34.8 g/dL (ref 30.0–36.0)
MCV: 88.5 fL (ref 80.0–100.0)
Monocytes Absolute: 0.2 10*3/uL (ref 0.1–1.0)
Monocytes Relative: 1 %
Neutro Abs: 12.7 10*3/uL — ABNORMAL HIGH (ref 1.7–7.7)
Neutrophils Relative %: 93 %
Platelets: 145 10*3/uL — ABNORMAL LOW (ref 150–400)
RBC: 3.93 MIL/uL (ref 3.87–5.11)
RDW: 13.7 % (ref 11.5–15.5)
WBC: 13.9 10*3/uL — ABNORMAL HIGH (ref 4.0–10.5)
nRBC: 0 % (ref 0.0–0.2)

## 2021-11-29 LAB — POCT I-STAT 7, (LYTES, BLD GAS, ICA,H+H)
Acid-base deficit: 1 mmol/L (ref 0.0–2.0)
Bicarbonate: 23.6 mmol/L (ref 20.0–28.0)
Calcium, Ion: 1.02 mmol/L — ABNORMAL LOW (ref 1.15–1.40)
HCT: 34 % — ABNORMAL LOW (ref 36.0–46.0)
Hemoglobin: 11.6 g/dL — ABNORMAL LOW (ref 12.0–15.0)
O2 Saturation: 91 %
Patient temperature: 98.3
Potassium: 3.6 mmol/L (ref 3.5–5.1)
Sodium: 132 mmol/L — ABNORMAL LOW (ref 135–145)
TCO2: 25 mmol/L (ref 22–32)
pCO2 arterial: 36.6 mmHg (ref 32–48)
pH, Arterial: 7.418 (ref 7.35–7.45)
pO2, Arterial: 59 mmHg — ABNORMAL LOW (ref 83–108)

## 2021-11-29 LAB — HEPARIN LEVEL (UNFRACTIONATED)
Heparin Unfractionated: 0.22 IU/mL — ABNORMAL LOW (ref 0.30–0.70)
Heparin Unfractionated: 0.23 IU/mL — ABNORMAL LOW (ref 0.30–0.70)

## 2021-11-29 LAB — GLUCOSE, CAPILLARY
Glucose-Capillary: 129 mg/dL — ABNORMAL HIGH (ref 70–99)
Glucose-Capillary: 164 mg/dL — ABNORMAL HIGH (ref 70–99)
Glucose-Capillary: 211 mg/dL — ABNORMAL HIGH (ref 70–99)
Glucose-Capillary: 214 mg/dL — ABNORMAL HIGH (ref 70–99)
Glucose-Capillary: 224 mg/dL — ABNORMAL HIGH (ref 70–99)
Glucose-Capillary: 262 mg/dL — ABNORMAL HIGH (ref 70–99)
Glucose-Capillary: 302 mg/dL — ABNORMAL HIGH (ref 70–99)
Glucose-Capillary: 327 mg/dL — ABNORMAL HIGH (ref 70–99)

## 2021-11-29 LAB — PHOSPHORUS: Phosphorus: 1.9 mg/dL — ABNORMAL LOW (ref 2.5–4.6)

## 2021-11-29 LAB — PROCALCITONIN: Procalcitonin: 80.08 ng/mL

## 2021-11-29 LAB — MAGNESIUM: Magnesium: 1.9 mg/dL (ref 1.7–2.4)

## 2021-11-29 MED ORDER — POTASSIUM PHOSPHATES 15 MMOLE/5ML IV SOLN
30.0000 mmol | Freq: Once | INTRAVENOUS | Status: AC
  Administered 2021-11-29: 30 mmol via INTRAVENOUS
  Filled 2021-11-29: qty 10

## 2021-11-29 MED ORDER — LINEZOLID 600 MG/300ML IV SOLN
600.0000 mg | Freq: Two times a day (BID) | INTRAVENOUS | Status: DC
  Administered 2021-11-29 – 2021-12-01 (×4): 600 mg via INTRAVENOUS
  Filled 2021-11-29 (×4): qty 300

## 2021-11-29 MED ORDER — MAGNESIUM SULFATE 2 GM/50ML IV SOLN
2.0000 g | Freq: Once | INTRAVENOUS | Status: AC
  Administered 2021-11-29: 2 g via INTRAVENOUS
  Filled 2021-11-29: qty 50

## 2021-11-29 MED ORDER — INSULIN GLARGINE-YFGN 100 UNIT/ML ~~LOC~~ SOLN
10.0000 [IU] | Freq: Two times a day (BID) | SUBCUTANEOUS | Status: DC
  Administered 2021-11-29 – 2021-12-04 (×12): 10 [IU] via SUBCUTANEOUS
  Filled 2021-11-29 (×14): qty 0.1

## 2021-11-29 MED ORDER — HEPARIN BOLUS VIA INFUSION
1500.0000 [IU] | Freq: Once | INTRAVENOUS | Status: AC
  Administered 2021-11-29: 1500 [IU] via INTRAVENOUS
  Filled 2021-11-29: qty 1500

## 2021-11-29 MED ORDER — HEPARIN BOLUS VIA INFUSION
2000.0000 [IU] | Freq: Once | INTRAVENOUS | Status: AC
  Administered 2021-11-29: 2000 [IU] via INTRAVENOUS
  Filled 2021-11-29: qty 2000

## 2021-11-29 MED ORDER — SODIUM CHLORIDE 0.9 % IV SOLN
2.0000 g | INTRAVENOUS | Status: DC
  Administered 2021-11-29 – 2021-12-01 (×3): 2 g via INTRAVENOUS
  Filled 2021-11-29 (×3): qty 20

## 2021-11-29 NOTE — Progress Notes (Signed)
[facility] ADULT ICU REPLACEMENT PROTOCOL FOR AM LAB REPLACEMENT ONLY  The patient does apply for the [facility] Adult ICU Electrolyte Replacment Protocol based on the criteria listed below:   1. Is GFR >/= 40 ml/min? Yes.    Patient's GFR today is >60 2. Is urine output >/= 0.5 ml/kg/hr for the last 6 hours? Yes.   3. Is BUN < 60 mg/dL? Yes.    Patient's BUN today is 24 4. Abnormal electrolyte(s): Phos 1.9, K 3.6 , Mg 1.9 5. Ordered repletion with: Kphos 30 mmol IV, Mg 2gm IV 6. If a panic level lab has been reported, has the CCM MD in charge been notified? No.  Christoper Fabian, PharmD, BCPS Please see amion for complete clinical pharmacist phone list 11/29/2021 7:40 AM

## 2021-11-29 NOTE — Progress Notes (Signed)
Bilateral lower extremity venous duplex completed. Refer to "CV Proc" under chart review to view preliminary results.  11/29/2021 2:30 PM Eula Fried., MHA, RVT, RDCS, RDMS

## 2021-11-29 NOTE — Progress Notes (Signed)
ANTICOAGULATION CONSULT NOTE   Pharmacy Consult for Heparin Indication: rule out VTE in setting of COVID infection/elevated D-dimer  Allergies  Allergen Reactions   Atorvastatin Other (See Comments)    SOB   Bactrim [Sulfamethoxazole-Trimethoprim] Other (See Comments)    Mouth will be like metal   Celebrex [Celecoxib] Diarrhea   Codeine Nausea And Vomiting   Dilaudid [Hydromorphone Hcl] Nausea And Vomiting   Hydrochlorothiazide Other (See Comments)    CANNOT VOID   Meloxicam Other (See Comments)    FLU LIKE SYMPTOMS   Morphine And Related Nausea And Vomiting   Tramadol     unknown   Wound Dressing Adhesive    Amoxicillin-Pot Clavulanate Diarrhea   Other Hives and Rash    emysin   Wellbutrin [Bupropion] Swelling and Rash    Patient Measurements: Height: 5\' 2"  (157.5 cm) Weight: 103.3 kg (227 lb 11.8 oz) IBW/kg (Calculated) : 50.1 Heparin Dosing Weight: 74.8kg  Vital Signs: Temp: 98.3 F (36.8 C) (07/31 0339) Temp Source: Axillary (07/31 0339) BP: 104/54 (07/30 2325) Pulse Rate: 91 (07/31 0330)  Labs: Recent Labs    11/27/21 1550 11/27/21 1601 11/27/21 1912 11/28/21 0000 11/28/21 0406 11/28/21 0848 11/28/21 1451 11/28/21 1657 11/28/21 1812 11/28/21 2230 11/29/21 0336  HGB 15.0   < > 13.7  --  13.5  --   --   --   --   --  12.1  HCT 45.2   < > 41.1  --  38.7  --   --   --   --   --  34.8*  PLT 172  --  153  --  150  --   --   --   --   --  145*  APTT 34  --   --   --   --   --   --   --   --   --   --   LABPROT 14.3  --   --   --   --   --   --   --   --   --   --   INR 1.1  --   --   --   --   --   --   --   --   --   --   HEPARINUNFRC  --   --   --   --   --  0.35  --  0.25*  --   --  0.22*  CREATININE 2.70*  --  2.61*  --  1.66*  --   --   --   --   --   --   TROPONINIHS  --    < > 29*   < >  --   --  1,609*  --  1,783* 2,836*  --    < > = values in this interval not displayed.     Estimated Creatinine Clearance: 39.6 mL/min (A) (by C-G formula  based on SCr of 1.66 mg/dL (H)).   Medical History: Past Medical History:  Diagnosis Date   Arthritis     Assessment: 61 yo female presented on 11/27/2021 for COVID positive with pneumonia. Pharmacy consulted to dose heparin for VTE treatment. D-dimer 10.4. VQ scan and doppler ordered. Patient is not on anticoagulation prior to admission. SQH given at 2208.   Heparin level 0.25 fell to < goal despite heparin drip rate increased 1350 units/hr.  No bleeding reported. CBC stable.   7/31 AM update:  Heparin level  low VQ intermediate probability PE No doppler yet  Goal of Therapy:  Heparin level 0.3-0.7 units/ml Monitor platelets by anticoagulation protocol: Yes   Plan:  Inc heparin to 1700 units/hr 1200 heparin level  Abran Duke, PharmD, BCPS Clinical Pharmacist Phone: [phone]

## 2021-11-29 NOTE — Progress Notes (Addendum)
NAME:  Alejandra [name], MRN:  [id number], DOB:  1961-04-09, LOS: 2 ADMISSION DATE:  11/27/2021, CONSULTATION DATE:  11/27/2021 REFERRING MD:  Dr. Suezanne Jacquet - ED, CHIEF COMPLAINT:  Sepsis    History of Present Illness:  Alejandra [name] is a 61 y.o. female with a PMH significant for arthritis, DMII, obesity and OSA non-compliant with CPAP who presented to the ED with SOB.  Reports recently returned from trip with family and majority of people traveling with her tested positive for COVID.  She became symptomatic 7/25 and quarantined at her son's house until last evening when she came home to her husband. He reports she had altered mentation and he called EMS today. Per EMS on room air patient was satting 70% with heart rate 128 respiratory rate 50.   On arrival to ED patient was febrile with temperature 103.3 tachycardic with heart rate in the low 100s, respiratory rate 40, hypotensive, and hypoxic requiring initiation of 15 L nonrebreather.  COVID swab positive.  Lab work significant for AKI with creatinine 2.70 BNP 141.4, high-sensitivity troponin 28, lactic 6.2, D-dimer 10.40.  Chest x-ray with multifocal airspace consolidation worrisome for multifocal pneumonia.  Given increased work of breathing and high O2 demand PCCM consulted for further management and admission  Pertinent  Medical History  Arthritis  Significant Hospital Events: Including procedures, antibiotic start and stop dates in addition to other pertinent events   7/29 admitted with SOB, COVID-positive with multifocal pneumonia seen on chest x-ray  Interim History / Subjective:   Patient remains bipap dependent She is doing ok with the mask, feels like her work of breathing is not worse No chest pain  Objective   Blood pressure (!) 105/58, pulse 74, temperature 98 F (36.7 C), temperature source Axillary, resp. rate (!) 32, height 5\' 2"  (1.575 m), weight 103.3 kg, SpO2 94 %.    FiO2 (%):  [80 %] 80 %   Intake/Output Summary  (Last 24 hours) at 11/29/2021 0911 Last data filed at 11/29/2021 0800 Gross per 24 hour  Intake 2052.38 ml  Output 875 ml  Net 1177.38 ml   Filed Weights   11/27/21 2300 11/28/21 0229 11/29/21 0500  Weight: 103.3 kg 103.3 kg 103.3 kg    Examination: General: obese woman, in mild distress, laying in bed, bipap in place HENT: [place]/AT, moist mucous membranes, sclera anicteric Lungs: diminished breath sounds, crackles posteriorly Cardiovascular: tachycardic, no murmur Abdomen: soft, non-tender, non-distended, BS+ Extremities: no edema, cool to touch Neuro: A&O x 3, Moving all extremities GU: n/a  Echo 11/28/21 LV EF 55-60%. RV systolic function is normal and RV size is normal.   EKG 11/29/21 No ST elevations noted  V/Q Scan - indeterminate for PE  Procal 137 > 80 Trop 1609> 12/01/21  Resolved Hospital Problem list     Assessment & Plan:  Acute Hypoxemic Respiratory Failure Multifocal pneumonia/COVID pneumonia  -Received first 2 doses of COVID-vaccine P: Continue supplemental oxygen for SPO2 goal greater than 90 Continue Decadron  Continue Remdesiver  Continue bipap support. Try intermittent HHFNC to take break from bipap if able.  Continue ceftriaxone, azithromycin and linezolid  Septic Shock  -Patient presented with tachycardia, tachypnea, fever, and positive lactic acid with acute concern for underlying infection including pneumonia P: Supplemental oxygen as below Follow up cultures Continue IV ceftriaxone, azithromycin and add linezolid as MRSA screen is positive MAP goal 65 or greater to wean levophed. Stop vasopressin.  Elevated D-dimer -D-dimer on admission 10.40, likely secondary to COVID-pneumonia P:  Echo without RV strain VQ scan indeterminate for PE Continue systemic heparin Lower extremity Dopplers VQ scan in the setting of AKI  Elevated BNP and high-sensitivity troponin -type II demand ischemia vs viral myocarditis P: Echo is reassuring Strict  intake and output Continuous telemetry Cardiology consulted overnight  Acute kidney injury Hypomagnesemia -Cr trended back to baseline P: Follow renal function / urine output Trend Bmet Avoid nephrotoxins, ensure adequate renal perfusion   DMII Mild hyperglycemia P: hemoglobin A1c Trend CBG SSI 10 units lantus daily  Morbid Obesity  Best Practice (right click and "Reselect all SmartList Selections" daily)   Diet/type: NPO DVT prophylaxis: prophylactic heparin  GI prophylaxis: PPI Lines: N/A Foley:  N/A Code Status:  full code Last date of multidisciplinary goals of care discussion: Updated at bedside   Labs   CBC: Recent Labs  Lab 11/27/21 1550 11/27/21 1738 11/27/21 1912 11/28/21 0406 11/29/21 0336 11/29/21 0408  WBC 6.6  --  10.4 15.8* 13.9*  --   NEUTROABS 6.2  --   --  14.1* 12.7*  --   HGB 15.0 15.6* 13.7 13.5 12.1 11.6*  HCT 45.2 46.0 41.1 38.7 34.8* 34.0*  MCV 91.9  --  91.9 88.4 88.5  --   PLT 172  --  153 150 145*  --     Basic Metabolic Panel: Recent Labs  Lab 11/27/21 1550 11/27/21 1738 11/27/21 1912 11/28/21 0406 11/29/21 0336 11/29/21 0408  NA 137 137  --  132* 131* 132*  K 3.7 3.7  --  3.8 3.6 3.6  CL 99  --   --  100 98  --   CO2 23  --   --  19* 23  --   GLUCOSE 195*  --   --  310* 223*  --   BUN 12  --   --  20 24*  --   CREATININE 2.70*  --  2.61* 1.66* 0.94  --   CALCIUM 8.2*  --   --  7.2* 6.9*  --   MG  --   --   --  1.0* 1.9  --   PHOS  --   --   --  3.6 1.9*  --    GFR: Estimated Creatinine Clearance: 69.9 mL/min (by C-G formula based on SCr of 0.94 mg/dL). Recent Labs  Lab 11/27/21 1550 11/27/21 1912 11/27/21 2306 11/28/21 0406 11/28/21 1159 11/29/21 0336  PROCALCITON  --  >150.00  --  137.50  --  80.08  WBC 6.6 10.4  --  15.8*  --  13.9*  LATICACIDVEN 6.2* 5.2* 4.0*  --  3.5*  --     Liver Function Tests: Recent Labs  Lab 11/27/21 1550 11/28/21 0406 11/29/21 0336  AST 41 54* 55*  ALT 24 26 27    ALKPHOS 67 50 52  BILITOT 0.7 0.5 0.8  PROT 6.7 5.8* 5.7*  ALBUMIN 3.1* 2.5* 2.3*   Recent Labs  Lab 11/27/21 1550  LIPASE 29   No results for input(s): "AMMONIA" in the last 168 hours.  ABG    Component Value Date/Time   PHART 7.418 11/29/2021 0408   PCO2ART 36.6 11/29/2021 0408   PO2ART 59 (L) 11/29/2021 0408   HCO3 23.6 11/29/2021 0408   TCO2 25 11/29/2021 0408   ACIDBASEDEF 1.0 11/29/2021 0408   O2SAT 91 11/29/2021 0408     Coagulation Profile: Recent Labs  Lab 11/27/21 1550  INR 1.1    Cardiac Enzymes: No results for input(s): "CKTOTAL", "CKMB", "CKMBINDEX", "TROPONINI"  in the last 168 hours.  HbA1C: Hgb A1c MFr Bld  Date/Time Value Ref Range Status  11/27/2021 07:12 PM 7.2 (H) 4.8 - 5.6 % Final    Comment:    (NOTE) Pre diabetes:          5.7%-6.4%  Diabetes:              >6.4%  Glycemic control for   <7.0% adults with diabetes     CBG: Recent Labs  Lab 11/28/21 1642 11/28/21 2059 11/28/21 2321 11/29/21 0330 11/29/21 0738  GLUCAP 177* 175* 262* 214* 224*    Critical care time: 35 minutes   Freda Jackson, MD [facility] Office: [phone]   See Amion for personal pager PCCM on call pager [phone] until 7pm. Please call Elink 7p-7a. [phone]

## 2021-11-29 NOTE — Consult Note (Signed)
Cardiology Consultation:   Patient ID: Alejandra [name] MRN: [id number]; DOB: 1961/03/16  Admit date: 11/27/2021 Date of Consult: 11/29/2021  PCP:  Clovis Riley, L.August Saucer, MD   [facility] Providers Cardiologist:  Waverly Ferrari  Patient Profile:   Alejandra [name] is a 61 y.o. female with a hx of arthritis, DM, obesity, OSA non-complaint with CPAP who is being seen 11/29/2021 for the evaluation of elevated troponin at the request of Dr. Francine Graven.  History of Present Illness:   Alejandra [name] is a 61 yo female with PMH noted above.  No reported cardiac history.  Recently returned from a family trip where a majority of the other travelers tested positive for COVID.  She became symptomatic 7/25 and quarantined at her son's house until the evening of 7/29.  Husband noted that she was altered and called EMS.  On EMS arrival she was noted to be hypoxic with sats in the 70s.  She was placed on a nonrebreather and transported to the ED for further evaluation.  In the ED she was found to have a temperature of 103.3, tachypneic and hypotensive.  Her labs showed sodium 137, potassium 3.7, creatinine 2.7, BNP 141, lactic acid 6.2>>5.2, WBC 6.6, hemoglobin 15, D-dimer 10.4, procalcitonin greater than 150, high-sensitivity troponin 1783>> 2836.  COVID-positive.  EKG showed sinus rhythm vs possible ectopic atrial rhythm (unusual p wave axis), 92 bpm, nonspecific T wave abnormality.  Chest x-ray with multifocal airspace consolidation most significant in the left upper lobe but worrisome for multifocal pneumonia.  She was placed on BiPAP in the ED and started on IV antibiotics as well as Decadron and remdesivir.  Started on IV heparin given concern for possible PE.  Admitted to PCCM for further management.  Echocardiogram 7/30 showed LVEF of 55 to 60% with no regional wall motion normality, normal RV function and size, mild to moderate mitral valve regurgitation.   Underwent VQ scan which was indeterminate for PE, with  recommendations for CT angiogram. Cardiology asked to evaluate in the setting of elevated troponin.   Past Medical History:  Diagnosis Date   Arthritis     No past surgical history on file.   Home Medications:  Prior to Admission medications   Medication Sig Start Date End Date Taking? Authorizing Provider  cetirizine (ZYRTEC) 10 MG chewable tablet Chew 10 mg by mouth daily.   Yes [provider]  glimepiride (AMARYL) 4 MG tablet Take 4 mg by mouth daily. 11/04/20  Yes [provider]  losartan (COZAAR) 50 MG tablet  04/06/16  Yes [provider]  misoprostol (CYTOTEC) 100 MCG tablet Take 100 mcg by mouth daily as needed (For Inflammation per spouse).   Yes [provider]  nitrofurantoin (MACRODANTIN) 100 MG capsule Take 100 mg by mouth daily as needed (For UTI).   Yes [provider]  TRULICITY 1.5 MG/0.5ML SOPN Inject 1.5 mg into the skin daily. 11/23/20  Yes [provider]  verapamil (CALAN-SR) 120 MG CR tablet  04/06/16  Yes [provider]    Inpatient Medications: Scheduled Meds:  aspirin EC  81 mg Oral Daily   Chlorhexidine Gluconate Cloth  6 each Topical Daily   dexamethasone (DECADRON) injection  6 mg Intravenous Q24H   heparin  1,500 Units Intravenous Once   insulin aspart  0-15 Units Subcutaneous Q4H   insulin glargine-yfgn  10 Units Subcutaneous BID   mouth rinse  15 mL Mouth Rinse 4 times per day   sodium chloride flush  10-40 mL  Intracatheter Q12H   Continuous Infusions:  sodium chloride     sodium chloride     azithromycin     cefTRIAXone (ROCEPHIN)  IV     heparin 1,900 Units/hr (11/29/21 1406)   linezolid (ZYVOX) IV     norepinephrine (LEVOPHED) Adult infusion 3 mcg/min (11/29/21 1300)   potassium PHOSPHATE IVPB (in mmol) 83 mL/hr at 11/29/21 1300   PRN Meds: Place/Maintain arterial line **AND** sodium chloride, acetaminophen, HYDROcodone-acetaminophen, ipratropium-albuterol, ondansetron (ZOFRAN)  IV, mouth rinse, sodium chloride flush  Allergies:    Allergies  Allergen Reactions   Atorvastatin Other (See Comments)    SOB   Bactrim [Sulfamethoxazole-Trimethoprim] Other (See Comments)    Mouth will be like metal   Celebrex [Celecoxib] Diarrhea   Codeine Nausea And Vomiting   Dilaudid [Hydromorphone Hcl] Nausea And Vomiting   Hydrochlorothiazide Other (See Comments)    CANNOT VOID   Meloxicam Other (See Comments)    FLU LIKE SYMPTOMS   Morphine And Related Nausea And Vomiting   Tramadol     unknown   Wound Dressing Adhesive    Amoxicillin-Pot Clavulanate Diarrhea   Other Hives and Rash    emysin   Wellbutrin [Bupropion] Swelling and Rash    Social History:   Social History   Socioeconomic History   Marital status: Married    Spouse name: Not on file   Number of children: Not on file   Years of education: Not on file   Highest education level: Not on file  Occupational History   Not on file  Tobacco Use   Smoking status: Former   Smokeless tobacco: Never  Substance and Sexual Activity   Alcohol use: No   Drug use: No   Sexual activity: Not on file  Other Topics Concern   Not on file  Social History Narrative   Not on file   Social Determinants of Health   Financial Resource Strain: Not on file  Food Insecurity: Not on file  Transportation Needs: Not on file  Physical Activity: Not on file  Stress: Not on file  Social Connections: Not on file  Intimate Partner Violence: Not on file    Family History:    Family History  Problem Relation Age of Onset   Hypertension Mother    Hypertension Father      ROS:  Please see the history of present illness.   All other ROS reviewed and negative.     Physical Exam/Data:   Vitals:   11/29/21 1300 11/29/21 1315 11/29/21 1330 11/29/21 1331  BP:      Pulse: 87 84 89 91  Resp: (!) 24 17 (!) 21 (!) 25  Temp:      TempSrc:      SpO2: 92% 93% 93% 90%  Weight:      Height:        Intake/Output  Summary (Last 24 hours) at 11/29/2021 1414 Last data filed at 11/29/2021 1400 Gross per 24 hour  Intake 2737.11 ml  Output 1450 ml  Net 1287.11 ml      11/29/2021    5:00 AM 11/28/2021    2:29 AM 11/27/2021   11:00 PM  Last 3 Weights  Weight (lbs) 227 lb 11.8 oz 227 lb 11.8 oz 227 lb 11.8 oz  Weight (kg) 103.3 kg 103.3 kg 103.3 kg     Body mass index is 41.65 kg/m.   EKG:  The EKG was personally reviewed and demonstrates:  Sinus rhythm va ectopic atrial rhythm (unusual p  wave axis), 92 bpm, nonspecific T wave changes Telemetry:  Telemetry was personally reviewed and demonstrates:  Sinus Rhythm  Relevant CV Studies:  Echo: 11/28/21  IMPRESSIONS     1. Left ventricular ejection fraction, by estimation, is 55 to 60%. The  left ventricle has normal function. The left ventricle has no regional  wall motion abnormalities. Left ventricular diastolic parameters were  normal.   2. Right ventricular systolic function is normal. The right ventricular  size is normal. Tricuspid regurgitation signal is inadequate for assessing  PA pressure.   3. The mitral valve is normal in structure. Mild to moderate mitral valve  regurgitation.   4. The aortic valve was not well visualized. Aortic valve regurgitation  is not visualized. No aortic stenosis is present.   5. The inferior vena cava is normal in size with greater than 50%  respiratory variability, suggesting right atrial pressure of 3 mmHg.   FINDINGS   Left Ventricle: Left ventricular ejection fraction, by estimation, is 55  to 60%. The left ventricle has normal function. The left ventricle has no  regional wall motion abnormalities. The left ventricular internal cavity  size was normal in size. There is   no left ventricular hypertrophy. Left ventricular diastolic parameters  were normal. Normal left ventricular filling pressure.   Right Ventricle: The right ventricular size is normal. No increase in  right ventricular wall  thickness. Right ventricular systolic function is  normal. Tricuspid regurgitation signal is inadequate for assessing PA  pressure.   Left Atrium: Left atrial size was normal in size.   Right Atrium: Right atrial size was normal in size.   Pericardium: There is no evidence of pericardial effusion.   Mitral Valve: The mitral valve is normal in structure. Mild to moderate  mitral valve regurgitation, with centrally-directed jet.   Tricuspid Valve: The tricuspid valve is normal in structure. Tricuspid  valve regurgitation is not demonstrated.   Aortic Valve: The aortic valve was not well visualized. Aortic valve  regurgitation is not visualized. No aortic stenosis is present.   Pulmonic Valve: The pulmonic valve was not well visualized. Pulmonic valve  regurgitation is not visualized.   Aorta: The aortic root is normal in size and structure.   Venous: The inferior vena cava is normal in size with greater than 50%  respiratory variability, suggesting right atrial pressure of 3 mmHg.   IAS/Shunts: The interatrial septum was not assessed.   Laboratory Data:  High Sensitivity Troponin:   Recent Labs  Lab 11/27/21 1912 11/28/21 0000 11/28/21 1451 11/28/21 1812 11/28/21 2230  TROPONINIHS 29* 28* 1,609* 1,783* 2,836*     Chemistry Recent Labs  Lab 11/27/21 1550 11/27/21 1738 11/27/21 1912 11/28/21 0406 11/29/21 0336 11/29/21 0408  NA 137   < >  --  132* 131* 132*  K 3.7   < >  --  3.8 3.6 3.6  CL 99  --   --  100 98  --   CO2 23  --   --  19* 23  --   GLUCOSE 195*  --   --  310* 223*  --   BUN 12  --   --  20 24*  --   CREATININE 2.70*  --  2.61* 1.66* 0.94  --   CALCIUM 8.2*  --   --  7.2* 6.9*  --   MG  --   --   --  1.0* 1.9  --   GFRNONAA 19*  --  20* 35* >  60  --   ANIONGAP 15  --   --  13 10  --    < > = values in this interval not displayed.    Recent Labs  Lab 11/27/21 1550 11/28/21 0406 11/29/21 0336  PROT 6.7 5.8* 5.7*  ALBUMIN 3.1* 2.5* 2.3*  AST  41 54* 55*  ALT 24 26 27   ALKPHOS 67 50 52  BILITOT 0.7 0.5 0.8   Lipids No results for input(s): "CHOL", "TRIG", "HDL", "LABVLDL", "LDLCALC", "CHOLHDL" in the last 168 hours.  Hematology Recent Labs  Lab 11/27/21 1912 11/28/21 0406 11/29/21 0336 11/29/21 0408  WBC 10.4 15.8* 13.9*  --   RBC 4.47 4.38 3.93  --   HGB 13.7 13.5 12.1 11.6*  HCT 41.1 38.7 34.8* 34.0*  MCV 91.9 88.4 88.5  --   MCH 30.6 30.8 30.8  --   MCHC 33.3 34.9 34.8  --   RDW 13.7 13.7 13.7  --   PLT 153 150 145*  --    Thyroid No results for input(s): "TSH", "FREET4" in the last 168 hours.  BNP Recent Labs  Lab 11/27/21 1601  BNP 141.4*    DDimer  Recent Labs  Lab 11/27/21 1550  DDIMER 10.40*     Radiology/Studies:  NM PULMONARY VENT AND PERF (V/Q Scan)  Result Date: 11/28/2021 CLINICAL DATA:  Chest pain. EXAM: NUCLEAR MEDICINE PERFUSION LUNG SCAN TECHNIQUE: Perfusion images were obtained in multiple projections after intravenous injection of radiopharmaceutical. Ventilation scans intentionally deferred if perfusion scan and chest x-ray adequate for interpretation during COVID 19 epidemic. RADIOPHARMACEUTICALS:  4.4 mCi Tc-1446m MAA IV COMPARISON:  Chest radiograph dated 11/29/2018. FINDINGS: There are bilateral perfusion defects which correspond to the areas of airspace opacity on the chest radiograph. Evaluation for PE on perfusion scan is limited in the presence of airspace disease. CT angiography may provide better evaluation if there is high clinical concern for acute PE. IMPRESSION: Indeterminate for pulmonary embolism. Electronically Signed   By: Elgie CollardArash  Radparvar M.D.   On: 11/28/2021 20:15   DG Chest Port 1 View  Result Date: 11/28/2021 CLINICAL DATA:  Chest pain EXAM: PORTABLE CHEST 1 VIEW COMPARISON:  November 27, 2021 FINDINGS: Bilateral pulmonary infiltrates. The right-sided infiltrates have worsened in the interval. The left-sided infiltrates are stable. No other changes. No pneumothorax.  IMPRESSION: Worsening of right patchy pulmonary infiltrates. Stable left pulmonary infiltrates. No other interval changes. Electronically Signed   By: Gerome Samavid  Williams III M.D.   On: 11/28/2021 17:08   ECHOCARDIOGRAM LIMITED  Result Date: 11/28/2021    ECHOCARDIOGRAM LIMITED REPORT   Patient Name:   Alejandra [name] Date of Exam: 11/28/2021 Medical Rec #:  [id number]       Height:       62.0 in Accession #:    [id number]      Weight:       227.7 lb Date of Birth:  25-Apr-1961       BSA:          2.020 m Patient Age:    61 years        BP:           89/48 mmHg Patient Gender: F               HR:           90 bpm. Exam Location:  Inpatient Procedure: Limited Color Doppler and Cardiac Doppler Indications:    chest pain  History:  Patient has no prior history of Echocardiogram examinations.                 Covid.; Risk Factors:Sleep Apnea and Diabetes.  Sonographer:    Delcie Roch RDCS Referring Phys: [phone] [facility] D HARRIS  Sonographer Comments: Bi-pap and Image acquisition challenging due to patient body habitus. IMPRESSIONS  1. Left ventricular ejection fraction, by estimation, is 55 to 60%. The left ventricle has normal function. The left ventricle has no regional wall motion abnormalities. Left ventricular diastolic parameters were normal.  2. Right ventricular systolic function is normal. The right ventricular size is normal. Tricuspid regurgitation signal is inadequate for assessing PA pressure.  3. The mitral valve is normal in structure. Mild to moderate mitral valve regurgitation.  4. The aortic valve was not well visualized. Aortic valve regurgitation is not visualized. No aortic stenosis is present.  5. The inferior vena cava is normal in size with greater than 50% respiratory variability, suggesting right atrial pressure of 3 mmHg. FINDINGS  Left Ventricle: Left ventricular ejection fraction, by estimation, is 55 to 60%. The left ventricle has normal function. The left ventricle has no regional  wall motion abnormalities. The left ventricular internal cavity size was normal in size. There is  no left ventricular hypertrophy. Left ventricular diastolic parameters were normal. Normal left ventricular filling pressure. Right Ventricle: The right ventricular size is normal. No increase in right ventricular wall thickness. Right ventricular systolic function is normal. Tricuspid regurgitation signal is inadequate for assessing PA pressure. Left Atrium: Left atrial size was normal in size. Right Atrium: Right atrial size was normal in size. Pericardium: There is no evidence of pericardial effusion. Mitral Valve: The mitral valve is normal in structure. Mild to moderate mitral valve regurgitation, with centrally-directed jet. Tricuspid Valve: The tricuspid valve is normal in structure. Tricuspid valve regurgitation is not demonstrated. Aortic Valve: The aortic valve was not well visualized. Aortic valve regurgitation is not visualized. No aortic stenosis is present. Pulmonic Valve: The pulmonic valve was not well visualized. Pulmonic valve regurgitation is not visualized. Aorta: The aortic root is normal in size and structure. Venous: The inferior vena cava is normal in size with greater than 50% respiratory variability, suggesting right atrial pressure of 3 mmHg. IAS/Shunts: The interatrial septum was not assessed. LEFT VENTRICLE PLAX 2D LVIDd:         4.20 cm     Diastology LVIDs:         3.00 cm     LV e' medial:    8.49 cm/s LV PW:         0.90 cm     LV E/e' medial:  10.7 LV IVS:        0.80 cm     LV e' lateral:   10.20 cm/s                            LV E/e' lateral: 8.9  LV Volumes (MOD) LV vol d, MOD A4C: 71.3 ml LV vol s, MOD A4C: 41.4 ml LV SV MOD A4C:     71.3 ml IVC IVC diam: 1.70 cm LEFT ATRIUM         Index LA diam:    3.10 cm 1.53 cm/m  AORTIC VALVE LVOT Vmax:   123.00 cm/s LVOT Vmean:  75.600 cm/s LVOT VTI:    0.196 m  AORTA Ao Asc diam: 2.90 cm MITRAL VALVE MV Area (PHT): 4.31 cm  SHUNTS MV  Decel Time: 176 msec    Systemic VTI: 0.20 m MV E velocity: 90.90 cm/s MV A velocity: 73.90 cm/s MV E/A ratio:  1.23 Mihai Croitoru MD Electronically signed by Thurmon Fair MD Signature Date/Time: 11/28/2021/5:05:08 PM    Final    DG Chest Port 1 View  Result Date: 11/27/2021 CLINICAL DATA:  Questionable sepsis.  COVID. EXAM: PORTABLE CHEST 1 VIEW COMPARISON:  Chest x-ray 08/06/2008 FINDINGS: There is multifocal airspace consolidation throughout both lungs most significant in the left upper lobe. There is no pleural effusion or pneumothorax. Cardiomediastinal silhouette is within normal limits. No acute fractures are seen. IMPRESSION: 1. Multifocal airspace consolidation, most significant in the left upper lobe, worrisome for multifocal pneumonia. Follow-up imaging recommended in 4-6 weeks to confirm complete resolution. Electronically Signed   By: Darliss Cheney M.D.   On: 11/27/2021 16:07     Assessment and Plan:   Alejandra [name] is a 61 y.o. female with a hx of arthritis, DM, obesity, OSA non-complaint with CPAP who is being seen 11/29/2021 for the evaluation of elevated troponin at the request of Dr. Francine Graven.  Elevated Troponin: High-sensitivity troponin 1783>>2836. Likely demand ischemia in the setting of severe sepsis/COVID-pneumonia with hypoxia.  No reports of chest pain PTA.  Echocardiogram with LVEF of 55 to 60% with normal RV size and function with no regional wall motion abnormality. Would continue to treat medically, eventually will need to risk stratify with ischemic work up though this may be deferred to the outpatient setting once she her COVID PNA has resolved. --Currently on IV heparin (VQ scan indeterminate for PE), continue ASA, add statin. Unable to add BB therapy in the setting of hypotension with pressor support  Acute hypoxic respiratory failure Multifocal pneumonia COVID-positive --Chest x-ray positive for multifocal pneumonia, requiring BiPAP.  Treated with IV antibiotics as  well as Decadron and remdesivir -- Per PCCM  Septic shock: Hypotensive on admission with elevated lactic acid and procalcitonin greater than 150.  Required pressor support on admission with Levophed and vasopressin -- Vasopressin now stopped with attempts to continue to wean Levophed  Elevated D-dimer: D-dimer on admission 10.4 in the setting of COVID-pneumonia.  No RV strain noted on echocardiogram.  VQ scan indeterminate for PE --Remains on IV heparin --Lower extremity Dopplers pending  DM: Hgb A1c 7.2 -- SSI  For questions or updates, please contact [facility] Please consult [email or web address] for contact info under    Signed, Laverda Page, NP  11/29/2021 2:14 PM  History and all data above reviewed.  Patient examined.  I agree with the findings as above.  The patient denies any past cardiac history other than a distant history of treadmill testing.  She does not typically complain of any cardiovascular issues.  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  She still does her household chores and daily living.     The patient exam reveals  GENERAL:  Well appearing HEENT:  Pupils equal round and reactive, fundi not visualized, oral mucosa unremarkable NECK:  No jugular venous distention, waveform within normal limits, carotid upstroke brisk and symmetric, no bruits, no thyromegaly LYMPHATICS:  No cervical, inguinal adenopathy LUNGS: Decreased breath sounds bilaterally BACK:  No CVA tenderness CHEST:  Unremarkable HEART:  PMI not displaced or sustained,S1 and S2 within normal limits, no S3, no S4, no clicks, no rubs, no murmurs ABD:  Flat, positive bowel sounds normal in frequency in pitch,  no bruits, no rebound, no guarding, no midline pulsatile mass, no hepatomegaly, no splenomegaly EXT:  2 plus pulses throughout, no edema, no cyanosis no clubbing SKIN:  No rashes no nodules NEURO:  Cranial nerves II through XII  grossly intact, motor grossly intact throughout PSYCH:  Cognitively intact, oriented to person place and time    All available labs, radiology testing, previous records reviewed. Agree with documented assessment and plan.  Elevated troponin: Agree that this is probably secondary.  She does have cardiovascular risk factors and so down the road I likely would screen her with stress testing but this does not need to take place until she is recovered from her acute illness.    Fayrene Fearing Efrat Zuidema  3:55 PM  11/29/2021

## 2021-11-29 NOTE — Inpatient Diabetes Management (Signed)
Inpatient Diabetes Program Recommendations  AACE/ADA: New Consensus Statement on Inpatient Glycemic Control (2015)  Target Ranges:  Prepandial:   less than 140 mg/dL      Peak postprandial:   less than 180 mg/dL (1-2 hours)      Critically ill patients:  140 - 180 mg/dL   Lab Results  Component Value Date   GLUCAP 224 (H) 11/29/2021   HGBA1C 7.2 (H) 11/27/2021    Review of Glycemic Control  Latest Reference Range & Units 11/28/21 23:21 11/29/21 03:30 11/29/21 07:38  Glucose-Capillary 70 - 99 mg/dL 660 (H) 630 (H) 160 (H)  (H): Data is abnormally high Diabetes history: Type 2 Dm Outpatient Diabetes medications: Trulicity 1.5 mg qwk, Amaryl 4mg  QD Current orders for Inpatient glycemic control: Semglee 10 units QD, Novolog 0-15 units Q4H, Decadron 6 mg QD  Inpatient Diabetes Program Recommendations:    Consider increasing Semglee to 10 units BID.  Secure chat sent to MD.   Thanks, , MSN, RNC-OB Diabetes Coordinator [phone] (8a-5p)

## 2021-11-29 NOTE — Progress Notes (Addendum)
ANTICOAGULATION CONSULT NOTE   Pharmacy Consult for Heparin Indication: rule out VTE in setting of COVID infection/elevated D-dimer  Allergies  Allergen Reactions   Atorvastatin Other (See Comments)    SOB   Bactrim [Sulfamethoxazole-Trimethoprim] Other (See Comments)    Mouth will be like metal   Celebrex [Celecoxib] Diarrhea   Codeine Nausea And Vomiting   Dilaudid [Hydromorphone Hcl] Nausea And Vomiting   Hydrochlorothiazide Other (See Comments)    CANNOT VOID   Meloxicam Other (See Comments)    FLU LIKE SYMPTOMS   Morphine And Related Nausea And Vomiting   Tramadol     unknown   Wound Dressing Adhesive    Amoxicillin-Pot Clavulanate Diarrhea   Other Hives and Rash    emysin   Wellbutrin [Bupropion] Swelling and Rash    Patient Measurements: Height: 5\' 2"  (157.5 cm) Weight: 103.3 kg (227 lb 11.8 oz) IBW/kg (Calculated) : 50.1 Heparin Dosing Weight: 74.8kg  Vital Signs: Temp: 98.3 F (36.8 C) (07/31 1145) Temp Source: Axillary (07/31 1145) BP: 105/58 (07/31 0510) Pulse Rate: 84 (07/31 1145)  Labs: Recent Labs    11/27/21 1550 11/27/21 1601 11/27/21 1912 11/28/21 0000 11/28/21 0406 11/28/21 0848 11/28/21 1451 11/28/21 1657 11/28/21 1812 11/28/21 2230 11/29/21 0336 11/29/21 0408  HGB 15.0   < > 13.7  --  13.5  --   --   --   --   --  12.1 11.6*  HCT 45.2   < > 41.1  --  38.7  --   --   --   --   --  34.8* 34.0*  PLT 172  --  153  --  150  --   --   --   --   --  145*  --   APTT 34  --   --   --   --   --   --   --   --   --   --   --   LABPROT 14.3  --   --   --   --   --   --   --   --   --   --   --   INR 1.1  --   --   --   --   --   --   --   --   --   --   --   HEPARINUNFRC  --   --   --   --   --  0.35  --  0.25*  --   --  0.22*  --   CREATININE 2.70*  --  2.61*  --  1.66*  --   --   --   --   --  0.94  --   TROPONINIHS  --    < > 29*   < >  --   --  1,609*  --  1,783* 2,836*  --   --    < > = values in this interval not displayed.      Estimated Creatinine Clearance: 69.9 mL/min (by C-G formula based on SCr of 0.94 mg/dL).   Medical History: Past Medical History:  Diagnosis Date   Arthritis     Assessment: 61 yo female presented on 11/27/2021 for COVID positive with pneumonia. Pharmacy consulted to dose heparin for VTE treatment. VQ scan intermediate. Unable to get CTA given patient's critical illness.   Heparin level 0.22 (subtherapeutic) while on 1700 units/hr - continues to decline despite increasing rate  of infusion. No bleeding reported. CBC stable.   Goal of Therapy:  Heparin level 0.3-0.7 units/ml Monitor platelets by anticoagulation protocol: Yes   Plan:  Bolus 1500 units  Increase to heparin 1900 units/hr Check 6h heparin level at 2000  Inadvertently referred to morning heparin level for above dosing recommendations. 1200 heparin level collected but never sent. No signs of bleeding or hemodynamic instability. Will continue with plan as outlined above and will monitor closely.   Eldridge Scot, PharmD

## 2021-11-29 NOTE — Progress Notes (Signed)
Istat results not crossing over in Epic. RT tried to manually upload by transmitting data and ISTAT continues to say communication in progress as if it is crossing over but not resulting. Results are as follows: pH 7.418  CO2 36.6 Po2 59 Bicarb 23.6 and Sat of 91%.

## 2021-11-29 NOTE — Progress Notes (Signed)
eLink Physician-Brief Progress Note Patient Name: Alejandra [name] DOB: [birth year] MRN: [id number]   Date of Service  11/29/2021  HPI/Events of Note  Troponins continue to climb.  No chest pain presently.  Remains on heparin drip.  No change in hemodynamics and remains on Bipap. Ischemia and PE considered  eICU Interventions  Cardiology consulted.  No change in plan for tonight pending EKG Stat EKG Repeat limited echo tomorrow Continue heparin drip Continue daily aspirin     Intervention Category Major Interventions: Other:  Henry Russel, P 11/29/2021, 12:20 AM

## 2021-11-29 NOTE — Progress Notes (Signed)
EKG performed with no issue. Results in chart.

## 2021-11-29 NOTE — Progress Notes (Signed)
ANTICOAGULATION CONSULT NOTE   Pharmacy Consult for Heparin Indication: rule out VTE in setting of COVID infection/elevated D-dimer  Allergies  Allergen Reactions   Atorvastatin Other (See Comments)    SOB   Bactrim [Sulfamethoxazole-Trimethoprim] Other (See Comments)    Mouth will be like metal   Celebrex [Celecoxib] Diarrhea   Codeine Nausea And Vomiting   Dilaudid [Hydromorphone Hcl] Nausea And Vomiting   Hydrochlorothiazide Other (See Comments)    CANNOT VOID   Meloxicam Other (See Comments)    FLU LIKE SYMPTOMS   Morphine And Related Nausea And Vomiting   Tramadol     unknown   Wound Dressing Adhesive    Amoxicillin-Pot Clavulanate Diarrhea   Other Hives and Rash    emysin   Wellbutrin [Bupropion] Swelling and Rash    Patient Measurements: Height: 5\' 2"  (157.5 cm) Weight: 103.3 kg (227 lb 11.8 oz) IBW/kg (Calculated) : 50.1 Heparin Dosing Weight: 74.8kg  Vital Signs: Temp: 99 F (37.2 C) (07/31 1915) Temp Source: Oral (07/31 1502) Pulse Rate: 81 (07/31 1915)  Labs: Recent Labs    11/27/21 1550 11/27/21 1601 11/27/21 1912 11/28/21 0000 11/28/21 0406 11/28/21 0848 11/28/21 1451 11/28/21 1657 11/28/21 1812 11/28/21 2230 11/29/21 0336 11/29/21 0408 11/29/21 1928  HGB 15.0   < > 13.7  --  13.5  --   --   --   --   --  12.1 11.6*  --   HCT 45.2   < > 41.1  --  38.7  --   --   --   --   --  34.8* 34.0*  --   PLT 172  --  153  --  150  --   --   --   --   --  145*  --   --   APTT 34  --   --   --   --   --   --   --   --   --   --   --   --   LABPROT 14.3  --   --   --   --   --   --   --   --   --   --   --   --   INR 1.1  --   --   --   --   --   --   --   --   --   --   --   --   HEPARINUNFRC  --   --   --   --   --    < >  --  0.25*  --   --  0.22*  --  0.23*  CREATININE 2.70*  --  2.61*  --  1.66*  --   --   --   --   --  0.94  --   --   TROPONINIHS  --    < > 29*   < >  --   --  1,609*  --  1,783* 2,836*  --   --   --    < > = values in this  interval not displayed.     Estimated Creatinine Clearance: 69.9 mL/min (by C-G formula based on SCr of 0.94 mg/dL).   Assessment: 61 yo female presented on 11/27/2021 for COVID positive with pneumonia. Pharmacy consulted to dose heparin for VTE treatment. VQ scan intermediate. Unable to get CTA given patient's critical illness.   Heparin level subtherapeutic at  0.23 units/mL while on 1900 units/hr.  No issue with infusion; no bleeding reported.  Goal of Therapy:  Heparin level 0.3-0.7 units/ml Monitor platelets by anticoagulation protocol: Yes   Plan:  Heparin 2000 units IV bolus, then Increase heparin infusion to 2150 units/hr Check 6 hr heparin level   Edman Lipsey D. Laney Potash, PharmD, BCPS, BCCCP 11/29/2021, 8:48 PM

## 2021-11-30 DIAGNOSIS — U071 COVID-19: Secondary | ICD-10-CM | POA: Diagnosis not present

## 2021-11-30 DIAGNOSIS — J1282 Pneumonia due to coronavirus disease 2019: Secondary | ICD-10-CM

## 2021-11-30 LAB — HEPARIN LEVEL (UNFRACTIONATED): Heparin Unfractionated: 0.35 IU/mL (ref 0.30–0.70)

## 2021-11-30 LAB — COMPREHENSIVE METABOLIC PANEL
ALT: 24 U/L (ref 0–44)
AST: 34 U/L (ref 15–41)
Albumin: 2.2 g/dL — ABNORMAL LOW (ref 3.5–5.0)
Alkaline Phosphatase: 58 U/L (ref 38–126)
Anion gap: 8 (ref 5–15)
BUN: 17 mg/dL (ref 8–23)
CO2: 26 mmol/L (ref 22–32)
Calcium: 7.4 mg/dL — ABNORMAL LOW (ref 8.9–10.3)
Chloride: 103 mmol/L (ref 98–111)
Creatinine, Ser: 0.76 mg/dL (ref 0.44–1.00)
GFR, Estimated: >60 mL/min (ref >60)
Glucose, Bld: 169 mg/dL — ABNORMAL HIGH (ref 70–99)
Potassium: 3.7 mmol/L (ref 3.5–5.1)
Sodium: 137 mmol/L (ref 135–145)
Total Bilirubin: 0.4 mg/dL (ref 0.3–1.2)
Total Protein: 5.9 g/dL — ABNORMAL LOW (ref 6.5–8.1)

## 2021-11-30 LAB — GLUCOSE, CAPILLARY
Glucose-Capillary: 158 mg/dL — ABNORMAL HIGH (ref 70–99)
Glucose-Capillary: 196 mg/dL — ABNORMAL HIGH (ref 70–99)
Glucose-Capillary: 232 mg/dL — ABNORMAL HIGH (ref 70–99)
Glucose-Capillary: 247 mg/dL — ABNORMAL HIGH (ref 70–99)
Glucose-Capillary: 194 mg/dL — ABNORMAL HIGH (ref 70–99)

## 2021-11-30 LAB — CBC WITH DIFFERENTIAL/PLATELET
Abs Immature Granulocytes: 0.09 10*3/uL — ABNORMAL HIGH (ref 0.00–0.07)
Basophils Absolute: 0.1 10*3/uL (ref 0.0–0.1)
Basophils Relative: 0 %
Eosinophils Absolute: 0 10*3/uL (ref 0.0–0.5)
Eosinophils Relative: 0 %
HCT: 34.3 % — ABNORMAL LOW (ref 36.0–46.0)
Hemoglobin: 11.7 g/dL — ABNORMAL LOW (ref 12.0–15.0)
Immature Granulocytes: 1 %
Lymphocytes Relative: 4 %
Lymphs Abs: 0.7 10*3/uL (ref 0.7–4.0)
MCH: 30.5 pg (ref 26.0–34.0)
MCHC: 34.1 g/dL (ref 30.0–36.0)
MCV: 89.6 fL (ref 80.0–100.0)
Monocytes Absolute: 0.2 10*3/uL (ref 0.1–1.0)
Monocytes Relative: 1 %
Neutro Abs: 16.4 10*3/uL — ABNORMAL HIGH (ref 1.7–7.7)
Neutrophils Relative %: 94 %
Platelets: 159 10*3/uL (ref 150–400)
RBC: 3.83 MIL/uL — ABNORMAL LOW (ref 3.87–5.11)
RDW: 13.7 % (ref 11.5–15.5)
Smear Review: NORMAL
WBC: 17.5 10*3/uL — ABNORMAL HIGH (ref 4.0–10.5)
nRBC: 0 % (ref 0.0–0.2)

## 2021-11-30 LAB — PHOSPHORUS
Phosphorus: 1.6 mg/dL — ABNORMAL LOW (ref 2.5–4.6)
Phosphorus: 1.6 mg/dL — ABNORMAL LOW (ref 2.5–4.6)

## 2021-11-30 LAB — LIPID PANEL
Cholesterol: 73 mg/dL (ref 0–200)
HDL: 25 mg/dL — ABNORMAL LOW (ref >40)
LDL Cholesterol: 32 mg/dL (ref 0–99)
Total CHOL/HDL Ratio: 2.9 RATIO
Triglycerides: 81 mg/dL (ref <150)
VLDL: 16 mg/dL (ref 0–40)

## 2021-11-30 LAB — URINE CULTURE: Culture: NO GROWTH

## 2021-11-30 LAB — MAGNESIUM: Magnesium: 2.8 mg/dL — ABNORMAL HIGH (ref 1.7–2.4)

## 2021-11-30 MED ORDER — HEPARIN SODIUM (PORCINE) 5000 UNIT/ML IJ SOLN
5000.0000 [IU] | Freq: Three times a day (TID) | INTRAMUSCULAR | Status: DC
  Administered 2021-11-30 – 2021-12-02 (×6): 5000 [IU] via SUBCUTANEOUS
  Filled 2021-11-30 (×6): qty 1

## 2021-11-30 MED ORDER — WHITE PETROLATUM EX OINT
TOPICAL_OINTMENT | CUTANEOUS | Status: DC | PRN
  Administered 2021-11-30: 0.2 via TOPICAL
  Filled 2021-11-30: qty 28.35

## 2021-11-30 MED ORDER — POTASSIUM PHOSPHATES 15 MMOLE/5ML IV SOLN
45.0000 mmol | Freq: Once | INTRAVENOUS | Status: AC
  Administered 2021-11-30: 45 mmol via INTRAVENOUS
  Filled 2021-11-30: qty 15

## 2021-11-30 MED ORDER — ACETAMINOPHEN 325 MG PO TABS
650.0000 mg | ORAL_TABLET | Freq: Four times a day (QID) | ORAL | Status: DC | PRN
Start: 2021-11-30 — End: 2021-12-06

## 2021-11-30 MED ORDER — TAMSULOSIN HCL 0.4 MG PO CAPS
0.4000 mg | ORAL_CAPSULE | Freq: Every day | ORAL | Status: DC
  Administered 2021-11-30 – 2021-12-06 (×7): 0.4 mg via ORAL
  Filled 2021-11-30 (×7): qty 1

## 2021-11-30 MED ORDER — GUAIFENESIN-DM 100-10 MG/5ML PO SYRP
5.0000 mL | ORAL_SOLUTION | ORAL | Status: DC | PRN
  Administered 2021-12-01: 5 mL via ORAL
  Filled 2021-11-30: qty 10

## 2021-11-30 MED ORDER — MUPIROCIN 2 % EX OINT
TOPICAL_OINTMENT | Freq: Two times a day (BID) | CUTANEOUS | Status: DC
  Filled 2021-11-30 (×3): qty 22

## 2021-11-30 NOTE — Evaluation (Addendum)
Physical Therapy Evaluation Patient Details Name: Alejandra [name] MRN: [id number] DOB: 1960-07-29 Today's Date: 11/30/2021  History of Present Illness  Pt is a 61 y.o. female admitted 11/27/21 with AMS, SOB, recent exposure to COVID. Workup for (+) COVID with multifocal PNA, septic shock. PMH includes DM2, OSA, arthritis, obesity.   Clinical Impression  Pt presents with an overall decrease in functional mobility secondary to above. PTA, pt independent, works as Interior and spatial designer, lives with husband. Initiated educ re: activity recommendations, pulmonary hygiene, importance of OOB mobility. Today, pt able to perform bouts of standing activity and transfer to recliner with up to minA. Pt with multiple complaints requiring frequent redirection to task. Expect pt to progress well with mobility as pulmonary status improves. Will follow acutely to address established goals.     SpO2 93% on 30L O2 HHFNC at 60% FiO2   Recommendations for follow up therapy are one component of a multi-disciplinary discharge planning process, led by the attending physician.  Recommendations may be updated based on patient status, additional functional criteria and insurance authorization.  Follow Up Recommendations No PT follow up      Assistance Recommended at Discharge Set up Supervision/Assistance  Patient can return home with the following  Assistance with cooking/housework;Assist for transportation;Help with stairs or ramp for entrance    Equipment Recommendations None recommended by PT  Recommendations for Other Services       Functional Status Assessment Patient has had a recent decline in their functional status and demonstrates the ability to make significant improvements in function in a reasonable and predictable amount of time.     Precautions / Restrictions Precautions Precautions: Fall;Other (comment) Precaution Comments: watch SpO2 (RT decreased to 30L O2 HHFNC at 60% FiO2 prior to  session) Restrictions Weight Bearing Restrictions: No      Mobility  Bed Mobility Overal bed mobility: Modified Independent             General bed mobility comments: cues to perform as indep as possible, use of bed rail    Transfers Overall transfer level: Needs assistance Equipment used: None Transfers: Sit to/from Stand, Bed to chair/wheelchair/BSC Sit to Stand: Min guard   Step pivot transfers: Min assist, Min guard       General transfer comment: multiple sit<>stands from EOB and recliner without DME, min guard for balance; pivotal steps from bed to recliner with minA for HHA to stabilize    Ambulation/Gait               General Gait Details: distance limited by [facility] lines  Stairs            Wheelchair Mobility    Modified Rankin (Stroke Patients Only)       Balance Overall balance assessment: Needs assistance   Sitting balance-Leahy Scale: Good     Standing balance support: No upper extremity supported, During functional activity Standing balance-Leahy Scale: Fair Standing balance comment: able to march in place and take steps without UE support                             Pertinent Vitals/Pain Pain Assessment Pain Assessment: Faces Faces Pain Scale: Hurts a little bit Pain Location: generalized Pain Descriptors / Indicators: Discomfort Pain Intervention(s): Monitored during session    Home Living Family/patient expects to be discharged to:: Private residence Living Arrangements: Spouse/significant other Available Help at Discharge: Family;Available 24 hours/day Type of Home: House Home Access: Stairs to enter  Home Layout: One level Home Equipment: None      Prior Function Prior Level of Function : Independent/Modified Independent;Working/employed             Mobility Comments: Independent without DME, self-employed as hairdresser ADLs Comments: indep, stands to Oncologist Dominance         Extremity/Trunk Assessment   Upper Extremity Assessment Upper Extremity Assessment: Overall WFL for tasks assessed    Lower Extremity Assessment Lower Extremity Assessment: Overall WFL for tasks assessed (noted BLE swelling)    Cervical / Trunk Assessment Cervical / Trunk Assessment: Normal  Communication   Communication: No difficulties  Cognition Arousal/Alertness: Awake/alert Behavior During Therapy: WFL for tasks assessed/performed Overall Cognitive Status: Within Functional Limits for tasks assessed                                 General Comments: WFL for simple tasks, not formally assessed. pt focused on overall disatisfaction with current condition, discomfort, lines, etc, requiring redirection from this to importance of mobility        General Comments General comments (skin integrity, edema, etc.): SpO2 93% on 30L O2 HHFNC at 60% FiO2 (recently titrated down from 80L at 100% FiO2 by RT). pt's family present at beginning of session, but stepping out for session. educ pt on importance of OOB mobility, activity recommendations    Exercises     Assessment/Plan    PT Assessment Patient needs continued PT services  PT Problem List Decreased activity tolerance;Decreased balance;Decreased mobility;Cardiopulmonary status limiting activity       PT Treatment Interventions DME instruction;Gait training;Stair training;Functional mobility training;Therapeutic activities;Therapeutic exercise;Balance training;Patient/family education    PT Goals (Current goals can be found in the Care Plan section)  Acute Rehab PT Goals Patient Stated Goal: get lines off PT Goal Formulation: With patient Time For Goal Achievement: 12/14/21 Potential to Achieve Goals: Good    Frequency Min 3X/week     Co-evaluation               AM-PAC PT "6 Clicks" Mobility  Outcome Measure Help needed turning from your back to your side while in a flat bed without using  bedrails?: None Help needed moving from lying on your back to sitting on the side of a flat bed without using bedrails?: A Little Help needed moving to and from a bed to a chair (including a wheelchair)?: A Little Help needed standing up from a chair using your arms (e.g., wheelchair or bedside chair)?: A Little Help needed to walk in hospital room?: A Little Help needed climbing 3-5 steps with a railing? : A Little 6 Click Score: 19    End of Session Equipment Utilized During Treatment: Oxygen Activity Tolerance: Patient tolerated treatment well Patient left: in chair;with call bell/phone within reach Nurse Communication: Mobility status PT Visit Diagnosis: Other abnormalities of gait and mobility (R26.89)    Time: 8938-1017 PT Time Calculation (min) (ACUTE ONLY): 25 min   Charges:   PT Evaluation $PT Eval Moderate Complexity: 1 Mod        Ina Homes, PT, DPT Acute Rehabilitation Services  Personal: Secure Chat Rehab Office: [phone]  Malachy Chamber 11/30/2021, 5:33 PM

## 2021-11-30 NOTE — TOC Initial Note (Addendum)
Transition of Care [facility]) - Initial/Assessment Note    Patient Details  Name: Alejandra [name] MRN: [id number] Date of Birth: Feb 22, 1961  Transition of Care [facility]) CM/SW Contact:    Tom-Johnson, Hershal Coria, RN Phone Number: 11/30/2021, 5:17 PM  Clinical Narrative:                  CM spoke with patient's husband, Rayna Sexton as patient is on Isolation for Covid. Patient is admitted for Pneumonia 2/2 Covid-19 infection. On BIPAP at bedtime and HFNC at 30%.  From home with husband, has three children. Patient is a self employed Producer, television/film/video. Has CPAP at home but not compliant.  PCP is Clovis Riley, L.August Saucer, MD and uses [employer]. No PT f/u noted. CM will continue to follow with needs as patient progresses with care.     Barriers to Discharge: Continued Medical Work up   Patient Goals and CMS Choice Patient states their goals for this hospitalization and ongoing recovery are:: To return home CMS Medicare.gov Compare Post Acute Care list provided to:: Patient Choice offered to / list presented to : Patient, Spouse  Expected Discharge Plan and Services     Discharge Planning Services: CM Consult   Living arrangements for the past 2 months: Single Family Home                                      Prior Living Arrangements/Services Living arrangements for the past 2 months: Single Family Home Lives with:: Spouse Patient language and need for interpreter reviewed:: Yes Do you feel safe going back to the place where you live?: Yes      Need for Family Participation in Patient Care: Yes (Comment) Care giver support system in place?: Yes (comment)   Criminal Activity/Legal Involvement Pertinent to Current Situation/Hospitalization: No - Comment as needed  Activities of Daily Living      Permission Sought/Granted Permission sought to share information with : Case Manager, Family Supports Permission granted to share information with : Yes, Verbal Permission Granted               Emotional Assessment Appearance:: Appears stated age Attitude/Demeanor/Rapport: Engaged, Gracious Affect (typically observed): Accepting, Appropriate, Calm, Hopeful, Pleasant Orientation: : Oriented to Self, Oriented to Place, Oriented to  Time, Oriented to Situation Alcohol / Substance Use: Not Applicable Psych Involvement: No (comment)  Admission diagnosis:  Acute respiratory failure with hypoxia (HCC) [J96.01] Acute kidney injury (HCC) [N17.9] Hypotension, unspecified hypotension type [I95.9] Sepsis, due to unspecified organism, unspecified whether acute organ dysfunction present (HCC) [A41.9] Pneumonia due to COVID-19 virus [U07.1, J12.82] Patient Active Problem List   Diagnosis Date Noted   Pneumonia due to COVID-19 virus 11/27/2021   Arthritis of left acromioclavicular joint 04/13/2016   PCP:  Clovis Riley, L.August Saucer, MD Pharmacy:   [facility] [place], [place] - [address] [place] [address] Phone: [phone] Fax: [phone]     Social Determinants of Health (SDOH) Interventions    Readmission Risk Interventions     No data to display

## 2021-11-30 NOTE — Progress Notes (Signed)
Placed patient on 15 LPM high flow cannula.

## 2021-11-30 NOTE — Progress Notes (Signed)
ANTICOAGULATION CONSULT NOTE   Pharmacy Consult for Heparin Indication: rule out VTE in setting of COVID infection/elevated D-dimer  Allergies  Allergen Reactions   Atorvastatin Other (See Comments)    SOB   Bactrim [Sulfamethoxazole-Trimethoprim] Other (See Comments)    Mouth will be like metal   Celebrex [Celecoxib] Diarrhea   Codeine Nausea And Vomiting   Dilaudid [Hydromorphone Hcl] Nausea And Vomiting   Hydrochlorothiazide Other (See Comments)    CANNOT VOID   Meloxicam Other (See Comments)    FLU LIKE SYMPTOMS   Morphine And Related Nausea And Vomiting   Tramadol     unknown   Wound Dressing Adhesive    Amoxicillin-Pot Clavulanate Diarrhea   Other Hives and Rash    emysin   Wellbutrin [Bupropion] Swelling and Rash    Patient Measurements: Height: 5\' 2"  (157.5 cm) Weight: 103.3 kg (227 lb 11.8 oz) IBW/kg (Calculated) : 50.1 Heparin Dosing Weight: 74.8kg  Vital Signs: Temp: 98.3 F (36.8 C) (08/01 0400) Temp Source: Axillary (08/01 0400) Pulse Rate: 83 (08/01 0406)  Labs: Recent Labs    11/27/21 1550 11/27/21 1601 11/28/21 0406 11/28/21 0848 11/28/21 1451 11/28/21 1657 11/28/21 1812 11/28/21 2230 11/29/21 0336 11/29/21 0408 11/29/21 1928 11/30/21 0323  HGB 15.0   < > 13.5  --   --   --   --   --  12.1 11.6*  --  11.7*  HCT 45.2   < > 38.7  --   --   --   --   --  34.8* 34.0*  --  34.3*  PLT 172   < > 150  --   --   --   --   --  145*  --   --  159  APTT 34  --   --   --   --   --   --   --   --   --   --   --   LABPROT 14.3  --   --   --   --   --   --   --   --   --   --   --   INR 1.1  --   --   --   --   --   --   --   --   --   --   --   HEPARINUNFRC  --   --   --    < >  --    < >  --   --  0.22*  --  0.23* 0.35  CREATININE 2.70*   < > 1.66*  --   --   --   --   --  0.94  --   --  0.76  TROPONINIHS  --    < >  --   --  1,609*  --  1,783* 2,836*  --   --   --   --    < > = values in this interval not displayed.     Estimated Creatinine  Clearance: 82.2 mL/min (by C-G formula based on SCr of 0.76 mg/dL).   Medical History: Past Medical History:  Diagnosis Date   Arthritis    Diabetes [facility])     Assessment: 61 yo female presented on 11/27/2021 for COVID positive with pneumonia. Pharmacy consulted to dose heparin for VTE treatment. D-dimer 10.4. VQ scan and doppler ordered. Patient is not on anticoagulation prior to admission. SQH given at 2208.   Heparin  level 0.25 fell to < goal despite heparin drip rate increased 1350 units/hr.  No bleeding reported. CBC stable.   8/1 AM update:  Heparin level therapeutic VQ intermediate probability PE Prelim doppler report negative  Goal of Therapy:  Heparin level 0.3-0.7 units/ml Monitor platelets by anticoagulation protocol: Yes   Plan:  Cont heparin 2150 units/hr 1200 heparin level  Abran Duke, PharmD, BCPS Clinical Pharmacist Phone: [phone]

## 2021-11-30 NOTE — Progress Notes (Signed)
[facility] ADULT ICU REPLACEMENT PROTOCOL   The patient does apply for the [facility] Adult ICU Electrolyte Replacment Protocol based on the criteria listed below:   1.Exclusion criteria: TCTS patients, ECMO patients, and Dialysis patients 2. Is GFR >/= 30 ml/min? Yes.    Patient's GFR today is >60 3. Is SCr </= 2? Yes.   Patient's SCr is 0.76 mg/dL 4. Did SCr increase >/= 0.5 in 24 hours? No. 5.Pt's weight >40kg  Yes.   6. Abnormal electrolyte(s): Phos 1.6 with K+ 3.7  7. Electrolytes replaced per protocol 8.  Call MD STAT for K+ </= 2.5, Phos </= 1, or Mag </= 1 Physician:  Dr. Henry Russel  Lolita Lenz 11/30/2021 5:02 AM

## 2021-11-30 NOTE — Progress Notes (Addendum)
NAME:  Alejandra [name], MRN:  [id number], DOB:  [birth year], LOS: 3 ADMISSION DATE:  11/27/2021, CONSULTATION DATE:  11/27/2021 REFERRING MD:  Dr. Suezanne Jacquet - ED, CHIEF COMPLAINT:  Sepsis    History of Present Illness:  Alejandra [name] is a 61 y.o. female with a PMH significant for arthritis, DMII, obesity and OSA non-compliant with CPAP who presented to the ED with SOB.  Reports recently returned from trip with family and majority of people traveling with her tested positive for COVID.  She became symptomatic 7/25 and quarantined at her son's house until last evening when she came home to her husband. He reports she had altered mentation and he called EMS today. Per EMS on room air patient was satting 70% with heart rate 128 respiratory rate 50.   On arrival to ED patient was febrile with temperature 103.3 tachycardic with heart rate in the low 100s, respiratory rate 40, hypotensive, and hypoxic requiring initiation of 15 L nonrebreather.  COVID swab positive.  Lab work significant for AKI with creatinine 2.70 BNP 141.4, high-sensitivity troponin 28, lactic 6.2, D-dimer 10.40.  Chest x-ray with multifocal airspace consolidation worrisome for multifocal pneumonia.  Given increased work of breathing and high O2 demand PCCM consulted for further management and admission  Pertinent  Medical History  Arthritis  Significant Hospital Events: Including procedures, antibiotic start and stop dates in addition to other pertinent events   7/29 admitted with SOB, COVID-positive with multifocal pneumonia seen on chest x-ray  Interim History / Subjective:   She is feeling better today She tolerated HHFNC yesterday and bipap overnight She is feeling hungry and wanting to get out of bed.  Objective   Blood pressure (!) 105/58, pulse 71, temperature 98.3 F (36.8 C), temperature source Axillary, resp. rate 19, height 5\' 2"  (1.575 m), weight 103.3 kg, SpO2 95 %.    FiO2 (%):  [100 %] 100 %   Intake/Output  Summary (Last 24 hours) at 11/30/2021 0719 Last data filed at 11/30/2021 01/30/2022 Gross per 24 hour  Intake 2396.87 ml  Output 2925 ml  Net -528.13 ml   Filed Weights   11/27/21 2300 11/28/21 0229 11/29/21 0500  Weight: 103.3 kg 103.3 kg 103.3 kg    Examination: General: obese woman, no acute distress, laying in bed, bipap in place HENT: [place]/AT, moist mucous membranes, sclera anicteric Lungs: diminished breath sounds, crackles posteriorly Cardiovascular: tachycardic, no murmur Abdomen: soft, non-tender, non-distended, BS+ Extremities: no edema, warm to touch Neuro: A&O x 3, Moving all extremities GU: n/a  Echo 11/28/21 LV EF 55-60%. RV systolic function is normal and RV size is normal.   EKG 11/29/21 No ST elevations noted  V/Q Scan - indeterminate for PE  Procal 137 > 80 Trop 1609> 12/01/21  Resolved Hospital Problem list     Assessment & Plan:  Acute Hypoxemic Respiratory Failure Multifocal pneumonia/COVID pneumonia  -Received first 2 doses of COVID-vaccine P: Continue supplemental oxygen for SPO2 goal greater than 90 Continue Decadron for total of 10 days Remdesivir completed HHFNC today and wean was tolerated for goal SpO2 92% Out of bed to chair today Continue ceftriaxone day 4/7, azithromycin day 3/5 and linezolid 3/7  Septic Shock  -Patient presented with tachycardia, tachypnea, fever, and positive lactic acid with acute concern for underlying infection including pneumonia P: Weaned off levophed yesterday Follow up cultures Continue IV ceftriaxone, azithromycin and add linezolid as MRSA screen is positive  Elevated D-dimer -D-dimer on admission 10.40, likely secondary to COVID-pneumonia P: Echo  without RV strain VQ scan indeterminate for PE Lower extremity Dopplers negative Stop systemic heparin and change to DVT PPx  Elevated BNP and high-sensitivity troponin -type II demand ischemia vs viral myocarditis P: Echo is reassuring Strict intake and  output Continuous telemetry Cardiology consulted overnight, will need outpatient follow up  Acute kidney injury Hypomagnesemia Hypophosphatemia -Cr trended back to baseline P: Follow renal function / urine output Trend Bmet Avoid nephrotoxins, ensure adequate renal perfusion  Replete electrolytes as needed  DMII Mild hyperglycemia P: hemoglobin A1c Trend CBG SSI 10 units lantus daily  Morbid Obesity  Best Practice (right click and "Reselect all SmartList Selections" daily)   Diet/type: clear liquids DVT prophylaxis: prophylactic heparin  GI prophylaxis: PPI Lines: N/A Foley:  N/A Code Status:  full code Last date of multidisciplinary goals of care discussion: Updated at bedside   Labs   CBC: Recent Labs  Lab 11/27/21 1550 11/27/21 1738 11/27/21 1912 11/28/21 0406 11/29/21 0336 11/29/21 0408 11/30/21 0323  WBC 6.6  --  10.4 15.8* 13.9*  --  17.5*  NEUTROABS 6.2  --   --  14.1* 12.7*  --  16.4*  HGB 15.0   < > 13.7 13.5 12.1 11.6* 11.7*  HCT 45.2   < > 41.1 38.7 34.8* 34.0* 34.3*  MCV 91.9  --  91.9 88.4 88.5  --  89.6  PLT 172  --  153 150 145*  --  159   < > = values in this interval not displayed.    Basic Metabolic Panel: Recent Labs  Lab 11/27/21 1550 11/27/21 1738 11/27/21 1912 11/28/21 0406 11/29/21 0336 11/29/21 0408 11/30/21 0323  NA 137 137  --  132* 131* 132* 137  K 3.7 3.7  --  3.8 3.6 3.6 3.7  CL 99  --   --  100 98  --  103  CO2 23  --   --  19* 23  --  26  GLUCOSE 195*  --   --  310* 223*  --  169*  BUN 12  --   --  20 24*  --  17  CREATININE 2.70*  --  2.61* 1.66* 0.94  --  0.76  CALCIUM 8.2*  --   --  7.2* 6.9*  --  7.4*  MG  --   --   --  1.0* 1.9  --  2.8*  PHOS  --   --   --  3.6 1.9*  --  1.6*   GFR: Estimated Creatinine Clearance: 82.2 mL/min (by C-G formula based on SCr of 0.76 mg/dL). Recent Labs  Lab 11/27/21 1550 11/27/21 1912 11/27/21 2306 11/28/21 0406 11/28/21 1159 11/29/21 0336 11/30/21 0323  PROCALCITON   --  >150.00  --  137.50  --  80.08  --   WBC 6.6 10.4  --  15.8*  --  13.9* 17.5*  LATICACIDVEN 6.2* 5.2* 4.0*  --  3.5*  --   --     Liver Function Tests: Recent Labs  Lab 11/27/21 1550 11/28/21 0406 11/29/21 0336 11/30/21 0323  AST 41 54* 55* 34  ALT 24 26 27 24   ALKPHOS 67 50 52 58  BILITOT 0.7 0.5 0.8 0.4  PROT 6.7 5.8* 5.7* 5.9*  ALBUMIN 3.1* 2.5* 2.3* 2.2*   Recent Labs  Lab 11/27/21 1550  LIPASE 29   No results for input(s): "AMMONIA" in the last 168 hours.  ABG    Component Value Date/Time   PHART 7.418 11/29/2021 0408  PCO2ART 36.6 11/29/2021 0408   PO2ART 59 (L) 11/29/2021 0408   HCO3 23.6 11/29/2021 0408   TCO2 25 11/29/2021 0408   ACIDBASEDEF 1.0 11/29/2021 0408   O2SAT 91 11/29/2021 0408     Coagulation Profile: Recent Labs  Lab 11/27/21 1550  INR 1.1    Cardiac Enzymes: No results for input(s): "CKTOTAL", "CKMB", "CKMBINDEX", "TROPONINI" in the last 168 hours.  HbA1C: Hgb A1c MFr Bld  Date/Time Value Ref Range Status  11/27/2021 07:12 PM 7.2 (H) 4.8 - 5.6 % Final    Comment:    (NOTE) Pre diabetes:          5.7%-6.4%  Diabetes:              >6.4%  Glycemic control for   <7.0% adults with diabetes     CBG: Recent Labs  Lab 11/29/21 1214 11/29/21 1510 11/29/21 1935 11/29/21 2343 11/30/21 0321  GLUCAP 302* 211* 129* 164* 158*    Critical care time: 35 minutes   Melody Comas, MD [facility] Office: [phone]   See Amion for personal pager PCCM on call pager [phone] until 7pm. Please call Elink 7p-7a. [phone]

## 2021-11-30 NOTE — Progress Notes (Signed)
eLink Physician-Brief Progress Note Patient Name: Alejandra [name] DOB: 1960-06-15 MRN: [id number]   Date of Service  11/30/2021  HPI/Events of Note  Patient requesting cough medicine.  eICU Interventions  Robitussin DM ordered.        [name] Yanky Vanderburg 11/30/2021, 10:45 PM

## 2021-12-01 DIAGNOSIS — N179 Acute kidney failure, unspecified: Secondary | ICD-10-CM | POA: Diagnosis not present

## 2021-12-01 DIAGNOSIS — U071 COVID-19: Secondary | ICD-10-CM | POA: Diagnosis not present

## 2021-12-01 DIAGNOSIS — J1282 Pneumonia due to coronavirus disease 2019: Secondary | ICD-10-CM | POA: Diagnosis not present

## 2021-12-01 DIAGNOSIS — J9601 Acute respiratory failure with hypoxia: Secondary | ICD-10-CM

## 2021-12-01 LAB — CBC WITH DIFFERENTIAL/PLATELET
Abs Immature Granulocytes: 0.21 10*3/uL — ABNORMAL HIGH (ref 0.00–0.07)
Basophils Absolute: 0 10*3/uL (ref 0.0–0.1)
Basophils Relative: 0 %
Eosinophils Absolute: 0 10*3/uL (ref 0.0–0.5)
Eosinophils Relative: 0 %
HCT: 33.6 % — ABNORMAL LOW (ref 36.0–46.0)
Hemoglobin: 11.3 g/dL — ABNORMAL LOW (ref 12.0–15.0)
Immature Granulocytes: 1 %
Lymphocytes Relative: 5 %
Lymphs Abs: 0.8 10*3/uL (ref 0.7–4.0)
MCH: 30.9 pg (ref 26.0–34.0)
MCHC: 33.6 g/dL (ref 30.0–36.0)
MCV: 91.8 fL (ref 80.0–100.0)
Monocytes Absolute: 0.3 10*3/uL (ref 0.1–1.0)
Monocytes Relative: 2 %
Neutro Abs: 15.6 10*3/uL — ABNORMAL HIGH (ref 1.7–7.7)
Neutrophils Relative %: 92 %
Platelets: 166 10*3/uL (ref 150–400)
RBC: 3.66 MIL/uL — ABNORMAL LOW (ref 3.87–5.11)
RDW: 13.8 % (ref 11.5–15.5)
WBC: 17 10*3/uL — ABNORMAL HIGH (ref 4.0–10.5)
nRBC: 0 % (ref 0.0–0.2)

## 2021-12-01 LAB — GLUCOSE, CAPILLARY
Glucose-Capillary: 152 mg/dL — ABNORMAL HIGH (ref 70–99)
Glucose-Capillary: 176 mg/dL — ABNORMAL HIGH (ref 70–99)
Glucose-Capillary: 179 mg/dL — ABNORMAL HIGH (ref 70–99)
Glucose-Capillary: 209 mg/dL — ABNORMAL HIGH (ref 70–99)
Glucose-Capillary: 210 mg/dL — ABNORMAL HIGH (ref 70–99)
Glucose-Capillary: 241 mg/dL — ABNORMAL HIGH (ref 70–99)
Glucose-Capillary: 309 mg/dL — ABNORMAL HIGH (ref 70–99)

## 2021-12-01 LAB — COMPREHENSIVE METABOLIC PANEL
ALT: 22 U/L (ref 0–44)
AST: 20 U/L (ref 15–41)
Albumin: 2.3 g/dL — ABNORMAL LOW (ref 3.5–5.0)
Alkaline Phosphatase: 55 U/L (ref 38–126)
Anion gap: 9 (ref 5–15)
BUN: 16 mg/dL (ref 8–23)
CO2: 24 mmol/L (ref 22–32)
Calcium: 7.3 mg/dL — ABNORMAL LOW (ref 8.9–10.3)
Chloride: 102 mmol/L (ref 98–111)
Creatinine, Ser: 0.72 mg/dL (ref 0.44–1.00)
GFR, Estimated: >60 mL/min (ref >60)
Glucose, Bld: 277 mg/dL — ABNORMAL HIGH (ref 70–99)
Potassium: 3.8 mmol/L (ref 3.5–5.1)
Sodium: 135 mmol/L (ref 135–145)
Total Bilirubin: 0.6 mg/dL (ref 0.3–1.2)
Total Protein: 5.7 g/dL — ABNORMAL LOW (ref 6.5–8.1)

## 2021-12-01 LAB — MAGNESIUM: Magnesium: 2.6 mg/dL — ABNORMAL HIGH (ref 1.7–2.4)

## 2021-12-01 LAB — PHOSPHORUS: Phosphorus: 1.9 mg/dL — ABNORMAL LOW (ref 2.5–4.6)

## 2021-12-01 MED ORDER — POTASSIUM PHOSPHATES 15 MMOLE/5ML IV SOLN
30.0000 mmol | Freq: Once | INTRAVENOUS | Status: AC
  Administered 2021-12-01: 30 mmol via INTRAVENOUS
  Filled 2021-12-01: qty 10

## 2021-12-01 MED ORDER — LINEZOLID 600 MG PO TABS
600.0000 mg | ORAL_TABLET | Freq: Two times a day (BID) | ORAL | Status: AC
Start: 2021-12-01 — End: 2021-12-04
  Administered 2021-12-01 – 2021-12-04 (×7): 600 mg via ORAL
  Filled 2021-12-01 (×8): qty 1

## 2021-12-01 MED ORDER — DEXAMETHASONE 6 MG PO TABS
6.0000 mg | ORAL_TABLET | Freq: Every day | ORAL | Status: DC
Start: 2021-12-01 — End: 2021-12-02
  Administered 2021-12-01: 6 mg via ORAL
  Filled 2021-12-01: qty 2

## 2021-12-01 MED ORDER — AZITHROMYCIN 500 MG PO TABS
250.0000 mg | ORAL_TABLET | Freq: Every day | ORAL | Status: DC
  Administered 2021-12-01: 250 mg via ORAL
  Filled 2021-12-01: qty 1

## 2021-12-01 NOTE — Progress Notes (Signed)
Patient decline foley removal and MD was notified. She was advised of the risk of catheter related infections and the need to discontinue the use of this device at the earliest ability. She acknowledged the risks and continued to decline removal due to concerns for urinary retention.

## 2021-12-01 NOTE — Inpatient Diabetes Management (Signed)
Inpatient Diabetes Program Recommendations  AACE/ADA: New Consensus Statement on Inpatient Glycemic Control (2015)  Target Ranges:  Prepandial:   less than 140 mg/dL      Peak postprandial:   less than 180 mg/dL (1-2 hours)      Critically ill patients:  140 - 180 mg/dL    Latest Reference Range & Units 11/29/21 23:43 11/30/21 03:21 11/30/21 08:29 11/30/21 12:31 11/30/21 15:29 11/30/21 20:22  Glucose-Capillary 70 - 99 mg/dL 098 (H)  3 units Novolog 158 (H)  3 units Novolog 196 (H)  3units Novolog  10 units Semglee @1004  232 (H)  5units Novolog 247 (H)  5 units Novolog 194 (H)  3 units Novolog  10 units [place]lgee @2158    (H): Data is abnormally high  Latest Reference Range & Units 12/01/21 00:07 12/01/21 03:55  Glucose-Capillary 70 - 99 mg/dL 01/31/22 (H)  11 units Novolog 210 (H)  5 units Novolog  (H): Data is abnormally high    Home DM Meds: Amaryl 4 mg daily       Trulicity 1.5 mg Qweek   Current Orders: Semglee 10 units BID  Novolog 0-15 units Q4H   Decadron 6 mg Daily  Full liquid PO diet   MD- Note CBGs >200 since 12pm yesterday  Please consider Increasing the Semglee Insulin to 15 units BID (0.3 units/kg)    --Will follow patient during hospitalization--  01/31/22 RN, MSN, CDCES Diabetes Coordinator Inpatient Glycemic Control Team Team Pager: [phone] (8a-5p)

## 2021-12-01 NOTE — Progress Notes (Signed)
Physical Therapy Treatment Patient Details Name: Alejandra [name] MRN: [id number] DOB: 1960/07/13 Today's Date: 12/01/2021   History of Present Illness Pt is a 61 y.o. female admitted 11/27/21 with AMS, SOB, recent exposure to COVID. Workup for (+) COVID with multifocal PNA, septic shock. PMH includes DM2, OSA, arthritis, obesity.    PT Comments    Pt will be ready to ambulate in the halls once transferred to the floor.  Emphasis on sit to stand safety, standing exercise and ambulation around the room x2.    Recommendations for follow up therapy are one component of a multi-disciplinary discharge planning process, led by the attending physician.  Recommendations may be updated based on patient status, additional functional criteria and insurance authorization.  Follow Up Recommendations  No PT follow up     Assistance Recommended at Discharge Set up Supervision/Assistance  Patient can return home with the following Assistance with cooking/housework;Assist for transportation;Help with stairs or ramp for entrance   Equipment Recommendations  None recommended by PT    Recommendations for Other Services       Precautions / Restrictions Precautions Precautions: Fall (low risk) Precaution Comments: watcg SpO2     Mobility  Bed Mobility                    Transfers Overall transfer level: Needs assistance Equipment used: None Transfers: Sit to/from Stand Sit to Stand: Min guard           General transfer comment: uses UE's appropriately    Ambulation/Gait Ambulation/Gait assistance: Supervision, Min guard Gait Distance (Feet): 50 Feet Assistive device: None Gait Pattern/deviations: Step-through pattern       General Gait Details: mildly unsteady, but not assist needed from AD or external support   Stairs             Wheelchair Mobility    Modified Rankin (Stroke Patients Only)       Balance Overall balance assessment: Needs assistance    Sitting balance-Leahy Scale: Good       Standing balance-Leahy Scale: Fair                              Cognition Arousal/Alertness: Awake/alert Behavior During Therapy: WFL for tasks assessed/performed Overall Cognitive Status: Within Functional Limits for tasks assessed                                          Exercises General Exercises - Lower Extremity Hip ABduction/ADduction: AROM, Strengthening, 10 reps, Standing Hip Flexion/Marching: AROM, Strengthening, Both, 10 reps, Standing Toe Raises: AROM, 5 reps, Standing Heel Raises: AROM, 15 reps, Standing Mini-Sqauts: AROM, 5 reps, Standing ("Squats don't work for me)    General Comments General comments (skin integrity, edema, etc.): VSS on 15L HFNC  Sats 98-91% except with coughing fit/blowing nose off O2 sats dropped to 77% before recovering back to 90%      Pertinent Vitals/Pain Pain Assessment Pain Assessment: Faces Faces Pain Scale: Hurts a little bit Pain Location: generalized Pain Descriptors / Indicators: Discomfort Pain Intervention(s): Monitored during session    Home Living                          Prior Function            PT Goals (current goals  can now be found in the care plan section) Acute Rehab PT Goals Patient Stated Goal: get lines off PT Goal Formulation: With patient Time For Goal Achievement: 12/14/21 Potential to Achieve Goals: Good Progress towards PT goals: Progressing toward goals    Frequency    Min 3X/week      PT Plan Current plan remains appropriate    Co-evaluation              AM-PAC PT "6 Clicks" Mobility   Outcome Measure  Help needed turning from your back to your side while in a flat bed without using bedrails?: None Help needed moving from lying on your back to sitting on the side of a flat bed without using bedrails?: None Help needed moving to and from a bed to a chair (including a wheelchair)?: A Little Help  needed standing up from a chair using your arms (e.g., wheelchair or bedside chair)?: A Little Help needed to walk in hospital room?: A Little Help needed climbing 3-5 steps with a railing? : A Little 6 Click Score: 20    End of Session Equipment Utilized During Treatment: Oxygen Activity Tolerance: Patient tolerated treatment well Patient left: in chair;with call bell/phone within reach Nurse Communication: Mobility status PT Visit Diagnosis: Other abnormalities of gait and mobility (R26.89)     Time: 5643-3295 PT Time Calculation (min) (ACUTE ONLY): 35 min  Charges:  $Gait Training: 8-22 mins $Therapeutic Exercise: 8-22 mins                     12/01/2021  Jacinto Halim., PT Acute Rehabilitation Services [phone]  (pager) [phone]  (office)   Alejandra [name] 12/01/2021, 12:36 PM

## 2021-12-01 NOTE — Progress Notes (Signed)
[facility] ADULT ICU REPLACEMENT PROTOCOL   The patient does apply for the [facility] Adult ICU Electrolyte Replacment Protocol based on the criteria listed below:   1.Exclusion criteria: TCTS patients, ECMO patients, and Dialysis patients 2. Is GFR >/= 30 ml/min? Yes.    Patient's GFR today is >60 3. Is SCr </= 2? Yes.   Patient's SCr is 0.72 mg/dL 4. Did SCr increase >/= 0.5 in 24 hours? No. 5.Pt's weight >40kg  Yes.   6. Abnormal electrolyte(s):   Phos 1.9  7. Electrolytes replaced per protocol 8.  Call MD STAT for K+ </= 2.5, Phos </= 1, or Mag </= 1 Physician:  Shawn Stall R Robie Oats 12/01/2021 3:42 AM

## 2021-12-01 NOTE — Progress Notes (Signed)
Patient tolerated HFNC and BIPAP through the night, was able to transfer from chair to bed with minimal assistance. Her chief complaint is her diet - she is tolerating her current diet but wants to be upgraded to full diet. [employer]

## 2021-12-01 NOTE — Progress Notes (Signed)
NAME:  Alejandra [name], MRN:  [id number], DOB:  January 21, 1961, LOS: 4 ADMISSION DATE:  11/27/2021, CONSULTATION DATE:  11/27/2021 REFERRING MD:  Dr. Suezanne Jacquet - ED, CHIEF COMPLAINT:  Sepsis    History of Present Illness:  Alejandra [name] is a 61 y.o. female with a PMH significant for arthritis, DMII, obesity and OSA non-compliant with CPAP who presented to the ED with SOB.  Reports recently returned from trip with family and majority of people traveling with her tested positive for COVID.  She became symptomatic 7/25 and quarantined at her son's house until last evening when she came home to her husband. He reports she had altered mentation and he called EMS today. Per EMS on room air patient was satting 70% with heart rate 128 respiratory rate 50.   On arrival to ED patient was febrile with temperature 103.3 tachycardic with heart rate in the low 100s, respiratory rate 40, hypotensive, and hypoxic requiring initiation of 15 L nonrebreather.  COVID swab positive.  Lab work significant for AKI with creatinine 2.70 BNP 141.4, high-sensitivity troponin 28, lactic 6.2, D-dimer 10.40.  Chest x-ray with multifocal airspace consolidation worrisome for multifocal pneumonia.  Given increased work of breathing and high O2 demand PCCM consulted for further management and admission  Pertinent  Medical History  Arthritis  Significant Hospital Events: Including procedures, antibiotic start and stop dates in addition to other pertinent events   7/29 admitted with SOB, COVID-positive with multifocal pneumonia seen on chest x-ray  Interim History / Subjective:   She is feeling better today She tolerated HHFNC and bipap overnight and was able to transfer from chair to bed.  Objective   Blood pressure (!) 93/58, pulse 89, temperature (!) 97.3 F (36.3 C), temperature source Axillary, resp. rate 20, height 5\' 2"  (1.575 m), weight 106.3 kg, SpO2 95 %.    FiO2 (%):  [60 %] 60 %   Intake/Output Summary (Last 24  hours) at 12/01/2021 1109 Last data filed at 12/01/2021 1040 Gross per 24 hour  Intake 1798.93 ml  Output 2055 ml  Net -256.07 ml   Filed Weights   11/28/21 0229 11/29/21 0500 12/01/21 0412  Weight: 103.3 kg 103.3 kg 106.3 kg   Examination: General: obese woman, no acute distress, talkative, laying in bed, [place] in place HENT: [place]/AT, moist mucous membranes, sclera anicteric Lungs: diminished breath sounds, crackles laterally Cardiovascular: RRR, no murmur Abdomen: soft, non-tender, non-distended, normoactive BS Extremities: no edema, warm to touch Neuro: A&O x 3, Moving all extremities GU: n/a  Echo 11/28/21 LV EF 55-60%. RV systolic function is normal and RV size is normal.   EKG 11/29/21 No ST elevations noted  V/Q Scan - indeterminate for PE  Procal 137 > 80 Trop 1609> 12/01/21  Resolved Hospital Problem list     Assessment & Plan:  Acute Hypoxemic Respiratory Failure Multifocal pneumonia/COVID pneumonia  -Received first 2 doses of COVID-vaccine P: Continue supplemental oxygen for SPO2 goal greater than 90 Continue Decadron for total of 10 days, day 5/10 Remdesivir completed HHFNC today and wean was tolerated for goal SpO2 92% Out of bed to chair today Continue ceftriaxone day 5/7, azithromycin day 4/5 and linezolid (MRSA+ screen) 4/7, transition meds to oral Transfer to [facility] tomorrow  Septic Shock  -Patient presented with tachycardia, tachypnea, fever, and positive lactic acid with acute concern for underlying infection including pneumonia P: Weaned off levophed Follow up cultures Continue abx as above  Elevated D-dimer -D-dimer on admission 10.40, likely secondary to COVID-pneumonia  P: Echo without RV strain VQ scan indeterminate for PE Lower extremity Dopplers negative DVT PPx  Elevated BNP and high-sensitivity troponin -type II demand ischemia vs viral myocarditis P: Echo is reassuring Strict intake and output Continuous telemetry Will need  outpatient cardiology follow up  Acute kidney injury Hypomagnesemia Hypophosphatemia -Cr trended back to baseline P: Follow renal function / urine output Trend Bmet Avoid nephrotoxins, ensure adequate renal perfusion  Replete electrolytes as needed  DMII Mild hyperglycemia P: hemoglobin A1c Trend CBG SSI 10 units lantus daily  Morbid Obesity  Best Practice (right click and "Reselect all SmartList Selections" daily)   Diet/type: full liquids  DVT prophylaxis: prophylactic heparin  GI prophylaxis: PPI Lines: N/A Foley:  N/A Code Status:  full code Last date of multidisciplinary goals of care discussion: Updated at bedside   Labs   CBC: Recent Labs  Lab 11/27/21 1550 11/27/21 1738 11/27/21 1912 11/28/21 0406 11/29/21 0336 11/29/21 0408 11/30/21 0323 12/01/21 0133  WBC 6.6  --  10.4 15.8* 13.9*  --  17.5* 17.0*  NEUTROABS 6.2  --   --  14.1* 12.7*  --  16.4* 15.6*  HGB 15.0   < > 13.7 13.5 12.1 11.6* 11.7* 11.3*  HCT 45.2   < > 41.1 38.7 34.8* 34.0* 34.3* 33.6*  MCV 91.9  --  91.9 88.4 88.5  --  89.6 91.8  PLT 172  --  153 150 145*  --  159 166   < > = values in this interval not displayed.   Basic Metabolic Panel: Recent Labs  Lab 11/27/21 1550 11/27/21 1738 11/27/21 1912 11/28/21 0406 11/29/21 0336 11/29/21 0408 11/30/21 0323 11/30/21 2039 12/01/21 0133  NA 137   < >  --  132* 131* 132* 137  --  135  K 3.7   < >  --  3.8 3.6 3.6 3.7  --  3.8  CL 99  --   --  100 98  --  103  --  102  CO2 23  --   --  19* 23  --  26  --  24  GLUCOSE 195*  --   --  310* 223*  --  169*  --  277*  BUN 12  --   --  20 24*  --  17  --  16  CREATININE 2.70*  --  2.61* 1.66* 0.94  --  0.76  --  0.72  CALCIUM 8.2*  --   --  7.2* 6.9*  --  7.4*  --  7.3*  MG  --   --   --  1.0* 1.9  --  2.8*  --  2.6*  PHOS  --   --   --  3.6 1.9*  --  1.6* 1.6* 1.9*   < > = values in this interval not displayed.   GFR: Estimated Creatinine Clearance: 83.6 mL/min (by C-G formula based  on SCr of 0.72 mg/dL). Recent Labs  Lab 11/27/21 1550 11/27/21 1912 11/27/21 2306 11/28/21 0406 11/28/21 1159 11/29/21 0336 11/30/21 0323 12/01/21 0133  PROCALCITON  --  >150.00  --  137.50  --  80.08  --   --   WBC 6.6 10.4  --  15.8*  --  13.9* 17.5* 17.0*  LATICACIDVEN 6.2* 5.2* 4.0*  --  3.5*  --   --   --    Liver Function Tests: Recent Labs  Lab 11/27/21 1550 11/28/21 0406 11/29/21 0336 11/30/21 0323 12/01/21 0133  AST 41 54*  55* 34 20  ALT 24 26 27 24 22   ALKPHOS 67 50 52 58 55  BILITOT 0.7 0.5 0.8 0.4 0.6  PROT 6.7 5.8* 5.7* 5.9* 5.7*  ALBUMIN 3.1* 2.5* 2.3* 2.2* 2.3*   Recent Labs  Lab 11/27/21 1550  LIPASE 29   No results for input(s): "AMMONIA" in the last 168 hours.  ABG    Component Value Date/Time   PHART 7.418 11/29/2021 0408   PCO2ART 36.6 11/29/2021 0408   PO2ART 59 (L) 11/29/2021 0408   HCO3 23.6 11/29/2021 0408   TCO2 25 11/29/2021 0408   ACIDBASEDEF 1.0 11/29/2021 0408   O2SAT 91 11/29/2021 0408    Coagulation Profile: Recent Labs  Lab 11/27/21 1550  INR 1.1   Cardiac Enzymes: No results for input(s): "CKTOTAL", "CKMB", "CKMBINDEX", "TROPONINI" in the last 168 hours.  HbA1C: Hgb A1c MFr Bld  Date/Time Value Ref Range Status  11/27/2021 07:12 PM 7.2 (H) 4.8 - 5.6 % Final    Comment:    (NOTE) Pre diabetes:          5.7%-6.4%  Diabetes:              >6.4%  Glycemic control for   <7.0% adults with diabetes    CBG: Recent Labs  Lab 11/30/21 1529 11/30/21 2022 12/01/21 0007 12/01/21 0355 12/01/21 0830  GLUCAP 247* 194* 309* 210* 176*   Critical care time: 30 minutes   01/31/22, DO 12/01/2021, 11:09 AM PGY-2, [facility]   See Amion for personal pager PCCM on call pager [phone] until 7pm. Please call Elink 7p-7a. [phone]

## 2021-12-02 ENCOUNTER — Inpatient Hospital Stay (HOSPITAL_COMMUNITY): Payer: 59

## 2021-12-02 DIAGNOSIS — J1282 Pneumonia due to coronavirus disease 2019: Secondary | ICD-10-CM | POA: Diagnosis not present

## 2021-12-02 DIAGNOSIS — U071 COVID-19: Secondary | ICD-10-CM | POA: Diagnosis not present

## 2021-12-02 LAB — CULTURE, BLOOD (ROUTINE X 2)
Culture: NO GROWTH
Culture: NO GROWTH
Special Requests: ADEQUATE

## 2021-12-02 LAB — CBC WITH DIFFERENTIAL/PLATELET
Abs Immature Granulocytes: 0.71 10*3/uL — ABNORMAL HIGH (ref 0.00–0.07)
Basophils Absolute: 0 10*3/uL (ref 0.0–0.1)
Basophils Relative: 0 %
Eosinophils Absolute: 0 10*3/uL (ref 0.0–0.5)
Eosinophils Relative: 0 %
HCT: 37.3 % (ref 36.0–46.0)
Hemoglobin: 12.2 g/dL (ref 12.0–15.0)
Immature Granulocytes: 5 %
Lymphocytes Relative: 8 %
Lymphs Abs: 1.3 10*3/uL (ref 0.7–4.0)
MCH: 30.3 pg (ref 26.0–34.0)
MCHC: 32.7 g/dL (ref 30.0–36.0)
MCV: 92.6 fL (ref 80.0–100.0)
Monocytes Absolute: 0.6 10*3/uL (ref 0.1–1.0)
Monocytes Relative: 4 %
Neutro Abs: 13.1 10*3/uL — ABNORMAL HIGH (ref 1.7–7.7)
Neutrophils Relative %: 83 %
Platelets: 193 10*3/uL (ref 150–400)
RBC: 4.03 MIL/uL (ref 3.87–5.11)
RDW: 13.7 % (ref 11.5–15.5)
WBC: 15.7 10*3/uL — ABNORMAL HIGH (ref 4.0–10.5)
nRBC: 0.1 % (ref 0.0–0.2)

## 2021-12-02 LAB — MRSA NEXT GEN BY PCR, NASAL: MRSA by PCR Next Gen: NOT DETECTED

## 2021-12-02 LAB — GLUCOSE, CAPILLARY
Glucose-Capillary: 191 mg/dL — ABNORMAL HIGH (ref 70–99)
Glucose-Capillary: 153 mg/dL — ABNORMAL HIGH (ref 70–99)
Glucose-Capillary: 112 mg/dL — ABNORMAL HIGH (ref 70–99)
Glucose-Capillary: 230 mg/dL — ABNORMAL HIGH (ref 70–99)
Glucose-Capillary: 231 mg/dL — ABNORMAL HIGH (ref 70–99)
Glucose-Capillary: 185 mg/dL — ABNORMAL HIGH (ref 70–99)

## 2021-12-02 LAB — COMPREHENSIVE METABOLIC PANEL
ALT: 20 U/L (ref 0–44)
AST: 19 U/L (ref 15–41)
Albumin: 2.3 g/dL — ABNORMAL LOW (ref 3.5–5.0)
Alkaline Phosphatase: 78 U/L (ref 38–126)
Anion gap: 8 (ref 5–15)
BUN: 14 mg/dL (ref 8–23)
CO2: 26 mmol/L (ref 22–32)
Calcium: 7.4 mg/dL — ABNORMAL LOW (ref 8.9–10.3)
Chloride: 102 mmol/L (ref 98–111)
Creatinine, Ser: 0.63 mg/dL (ref 0.44–1.00)
GFR, Estimated: >60 mL/min (ref >60)
Glucose, Bld: 167 mg/dL — ABNORMAL HIGH (ref 70–99)
Potassium: 4 mmol/L (ref 3.5–5.1)
Sodium: 136 mmol/L (ref 135–145)
Total Bilirubin: 1 mg/dL (ref 0.3–1.2)
Total Protein: 5.9 g/dL — ABNORMAL LOW (ref 6.5–8.1)

## 2021-12-02 LAB — MAGNESIUM: Magnesium: 2.3 mg/dL (ref 1.7–2.4)

## 2021-12-02 LAB — BRAIN NATRIURETIC PEPTIDE: B Natriuretic Peptide: 251.1 pg/mL — ABNORMAL HIGH (ref 0.0–100.0)

## 2021-12-02 LAB — D-DIMER, QUANTITATIVE: D-Dimer, Quant: 2.41 ug/mL-FEU — ABNORMAL HIGH (ref 0.00–0.50)

## 2021-12-02 LAB — TROPONIN I (HIGH SENSITIVITY)
Troponin I (High Sensitivity): 159 ng/L (ref <18)
Troponin I (High Sensitivity): 134 ng/L (ref <18)

## 2021-12-02 LAB — PHOSPHORUS: Phosphorus: 2.8 mg/dL (ref 2.5–4.6)

## 2021-12-02 LAB — PROCALCITONIN: Procalcitonin: 8.34 ng/mL

## 2021-12-02 LAB — C-REACTIVE PROTEIN: CRP: 12.2 mg/dL — ABNORMAL HIGH (ref <1.0)

## 2021-12-02 MED ORDER — ALPRAZOLAM 0.5 MG PO TABS
0.5000 mg | ORAL_TABLET | Freq: Two times a day (BID) | ORAL | Status: DC | PRN
  Filled 2021-12-02: qty 1

## 2021-12-02 MED ORDER — SALINE SPRAY 0.65 % NA SOLN
1.0000 | NASAL | Status: DC | PRN
  Filled 2021-12-02: qty 44

## 2021-12-02 MED ORDER — FUROSEMIDE 10 MG/ML IJ SOLN
60.0000 mg | Freq: Once | INTRAMUSCULAR | Status: DC
  Filled 2021-12-02: qty 6

## 2021-12-02 MED ORDER — FLUCONAZOLE 100 MG PO TABS
100.0000 mg | ORAL_TABLET | Freq: Once | ORAL | Status: AC
Start: 2021-12-02 — End: 2021-12-02
  Administered 2021-12-02: 100 mg via ORAL
  Filled 2021-12-02: qty 1

## 2021-12-02 MED ORDER — LEVOFLOXACIN 750 MG PO TABS
750.0000 mg | ORAL_TABLET | Freq: Every day | ORAL | Status: AC
  Administered 2021-12-02 – 2021-12-06 (×5): 750 mg via ORAL
  Filled 2021-12-02 (×5): qty 1

## 2021-12-02 MED ORDER — PANTOPRAZOLE SODIUM 40 MG PO TBEC
40.0000 mg | DELAYED_RELEASE_TABLET | Freq: Every day | ORAL | Status: DC
  Administered 2021-12-02 – 2021-12-06 (×5): 40 mg via ORAL
  Filled 2021-12-02 (×5): qty 1

## 2021-12-02 MED ORDER — METHYLPREDNISOLONE SODIUM SUCC 125 MG IJ SOLR
60.0000 mg | INTRAMUSCULAR | Status: DC
  Administered 2021-12-02 – 2021-12-03 (×2): 60 mg via INTRAVENOUS
  Filled 2021-12-02 (×2): qty 2

## 2021-12-02 MED ORDER — ENOXAPARIN SODIUM 120 MG/0.8ML IJ SOSY
1.0000 mg/kg | PREFILLED_SYRINGE | Freq: Two times a day (BID) | INTRAMUSCULAR | Status: DC
  Administered 2021-12-02 – 2021-12-03 (×3): 108 mg via SUBCUTANEOUS
  Filled 2021-12-02 (×4): qty 0.72

## 2021-12-02 MED ORDER — FUROSEMIDE 10 MG/ML IJ SOLN
40.0000 mg | Freq: Once | INTRAMUSCULAR | Status: AC
  Administered 2021-12-02: 40 mg via INTRAVENOUS
  Filled 2021-12-02: qty 4

## 2021-12-02 NOTE — Progress Notes (Signed)
Progress Note  Patient Name: Alejandra [name] Date of Encounter: 12/02/2021  Primary Cardiologist:   None   Subjective   She does not report chest pain.  She is SOB.  Unable to cough without having a BM  Inpatient Medications    Scheduled Meds:  aspirin EC  81 mg Oral Daily   Chlorhexidine Gluconate Cloth  6 each Topical Daily   enoxaparin (LOVENOX) injection  1 mg/kg Subcutaneous Q12H   insulin aspart  0-15 Units Subcutaneous Q4H   insulin glargine-yfgn  10 Units Subcutaneous BID   levofloxacin  750 mg Oral Daily   linezolid  600 mg Oral Q12H   methylPREDNISolone (SOLU-MEDROL) injection  60 mg Intravenous Q24H   mupirocin ointment   Nasal BID   mouth rinse  15 mL Mouth Rinse 4 times per day   pantoprazole  40 mg Oral Daily   tamsulosin  0.4 mg Oral Daily   Continuous Infusions:  sodium chloride     sodium chloride     PRN Meds: Place/Maintain arterial line **AND** sodium chloride, acetaminophen, guaiFENesin-dextromethorphan, HYDROcodone-acetaminophen, ipratropium-albuterol, ondansetron (ZOFRAN) IV, mouth rinse, sodium chloride, white petrolatum   Vital Signs    Vitals:   12/02/21 0025 12/02/21 0340 12/02/21 0500 12/02/21 0829  BP:  (!) 98/53  129/65  Pulse:  78  81  Resp:  19  17  Temp:  98.3 F (36.8 C)  97.7 F (36.5 C)  TempSrc:  Oral  Oral  SpO2: 93% 93%  92%  Weight:   108.1 kg   Height:        Intake/Output Summary (Last 24 hours) at 12/02/2021 1114 Last data filed at 12/01/2021 1200 Gross per 24 hour  Intake 313.03 ml  Output --  Net 313.03 ml   Filed Weights   11/29/21 0500 12/01/21 0412 12/02/21 0500  Weight: 103.3 kg 106.3 kg 108.1 kg    Telemetry    NSR - Personally Reviewed  ECG    Pending - Personally Reviewed  Physical Exam   GEN: No acute distress.   Neck: No  JVD Cardiac: RRR, no murmurs, rubs, or gallops.  Respiratory: Clear  to auscultation bilaterally. GI: Soft, nontender, non-distended  MS: No  edema; No  deformity. Neuro:  Nonfocal  Psych: Normal affect   Labs    Chemistry Recent Labs  Lab 11/30/21 0323 12/01/21 0133 12/02/21 0511  NA 137 135 136  K 3.7 3.8 4.0  CL 103 102 102  CO2 26 24 26   GLUCOSE 169* 277* 167*  BUN 17 16 14   CREATININE 0.76 0.72 0.63  CALCIUM 7.4* 7.3* 7.4*  PROT 5.9* 5.7* 5.9*  ALBUMIN 2.2* 2.3* 2.3*  AST 34 20 19  ALT 24 22 20   ALKPHOS 58 55 78  BILITOT 0.4 0.6 1.0  GFRNONAA >60 >60 >60  ANIONGAP 8 9 8      Hematology Recent Labs  Lab 11/30/21 0323 12/01/21 0133 12/02/21 0511  WBC 17.5* 17.0* 15.7*  RBC 3.83* 3.66* 4.03  HGB 11.7* 11.3* 12.2  HCT 34.3* 33.6* 37.3  MCV 89.6 91.8 92.6  MCH 30.5 30.9 30.3  MCHC 34.1 33.6 32.7  RDW 13.7 13.8 13.7  PLT 159 166 193    Cardiac EnzymesNo results for input(s): "TROPONINI" in the last 168 hours. No results for input(s): "TROPIPOC" in the last 168 hours.   BNP Recent Labs  Lab 11/27/21 1601 12/02/21 0511  BNP 141.4* 251.1*     DDimer  Recent Labs  Lab 11/27/21 1550 12/02/21  6283  DDIMER 10.40* 2.41*     Radiology    DG Chest Port 1 View  Result Date: 12/02/2021 CLINICAL DATA:  COVID EXAM: PORTABLE CHEST 1 VIEW COMPARISON:  11/28/2021 FINDINGS: Unchanged patchy bilateral airspace disease. Cardiomegaly. No visible effusion or pneumothorax. IMPRESSION: Unchanged multifocal pneumonia. Electronically Signed   By: Tiburcio Pea M.D.   On: 12/02/2021 06:32    Cardiac Studies   Echo:  see above  Patient Profile     61 y.o. female with a hx of arthritis, DM, obesity, OSA non-complaint with CPAP who is being seen 11/29/2021 for the evaluation of elevated troponin at the request of Dr. Francine Graven.  Assessment & Plan    Elevated Troponin:  Likely demand ischemia.   For completeness I would like to repeat an EKG  and cycle one more set of enzymes as they were trending up four days ago.      Acute hypoxic respiratory failure:    Still with labile O2 demand with COVID and multifocal  pneumonia.  CXR still markedly abnormal.  Mildly increased BNP.  Agree with diuresis.       Septic shock:     Resolved.    Elevated D-dimer:    V2 indeterminate.  Dopplers negative.  No objective evidence of PE and being managed with full dose anticoagulation.    DM:      Hgb A1c 7.2, coverage per primary team.    For questions or updates, please contact [facility] Please consult [email or web address] for contact info under Cardiology/STEMI.   Signed, Rollene Rotunda, MD  12/02/2021, 11:14 AM

## 2021-12-02 NOTE — Progress Notes (Signed)
Patient resting well in no distress on 9L [place].  Patient satting 93%.  Patient complaining of very sore/dry nostrils.  BiPAP in standby at bedside if needed.

## 2021-12-02 NOTE — Progress Notes (Signed)
eLink Physician-Brief Progress Note Patient Name: Alejandra [name] DOB: November 17, 1960 MRN: [id number]   Date of Service  12/02/2021  HPI/Events of Note  BP 98/53, MAP 68, HR 71, good urine output, no distress, patient subsequently woke up and MAP is now in the 70's.  eICU Interventions  No indication for intervention at this time.        [name] Tyrea Froberg 12/02/2021, 4:05 AM

## 2021-12-02 NOTE — Progress Notes (Signed)
Verbal order from Dr. Thedore Mins for 0.5 xanax BID prn

## 2021-12-02 NOTE — Progress Notes (Addendum)
Pt currently on 9L [place] with no distress, sats 95%. Pt refusing Bipap at this time due to Sore nostrils. Bipap on standby in room. RT will contiine to monitor.

## 2021-12-02 NOTE — Evaluation (Signed)
Occupational Therapy Evaluation Patient Details Name: Alejandra [name] MRN: [id number] DOB: 1961/03/10 Today's Date: 12/02/2021   History of Present Illness Pt is a 61 y.o. female admitted 11/27/21 with AMS, SOB, recent exposure to COVID. Workup for (+) COVID with multifocal PNA, septic shock. PMH includes DM2, OSA, arthritis, obesity.   Clinical Impression   Mrs. Tyarra [name] is a 61 year old woman who is normally independent and works long hours as a Scientist, research (medical).  On evaluation she is sitting up in recliner on 9 L HFNC. Evaluation limited by patient's discontent in regards to hospital stay, staff, food, oxygen needs, lines and leads. Therapist spent a lot of time actively listening to patient to help her feel heard and to at tempt to assuage any current issues. Patient reports having uncontrolled bowel movements with coughing which is frequent. She is not interested in occupational therapy or exercise as she calls it while dealing with constant bowel movements as well as dealing with lines/leads/oxygen. She stood twice and maintained balance while therapist assisted with cleaning her up and changing linens in chair with no overt difficulties. Patient doesn't report any significant weakness and believes that if she was unfettered she would be completely fine to ambulate and do all her tasks because she was completely independent prior to hospitalization. She reports she doesn't want to have oxygen at home either. She doesn't particularly want therapy to come see her as well (as mentioned above) but will follow acutely to make sure patient is able to maintain functional abilities so she can return home at discharge.      Recommendations for follow up therapy are one component of a multi-disciplinary discharge planning process, led by the attending physician.  Recommendations may be updated based on patient status, additional functional criteria and insurance authorization.   Follow Up Recommendations   No OT follow up    Assistance Recommended at Discharge Intermittent Supervision/Assistance  Patient can return home with the following Assistance with cooking/housework;Help with stairs or ramp for entrance    Functional Status Assessment  Patient has had a recent decline in their functional status and demonstrates the ability to make significant improvements in function in a reasonable and predictable amount of time.  Equipment Recommendations  Tub/shower seat    Recommendations for Other Services       Precautions / Restrictions Precautions Precautions: Other (comment) Precaution Comments: watcg SpO2 Restrictions Weight Bearing Restrictions: No      Mobility Bed Mobility               General bed mobility comments: up in recilner    Transfers Overall transfer level: Needs assistance Equipment used: None   Sit to Stand: Min guard           General transfer comment: Min guard to stand twice and maintain standing balance for toileting task.      Balance Overall balance assessment: Mild deficits observed, not formally tested                                         ADL either performed or assessed with clinical judgement   ADL Overall ADL's : Needs assistance/impaired Eating/Feeding: Independent   Grooming: Set up   Upper Body Bathing: Set up   Lower Body Bathing: Minimal assistance;Sit to/from stand   Upper Body Dressing : Set up   Lower Body Dressing: Minimal assistance;Sit to/from stand  Toilet Transfer: Quarry manager and Hygiene: Moderate assistance Toileting - Clothing Manipulation Details (indicate cue type and reason): assistance to be cleaned after incontinent of BM. reprots she is having BM with coughing that cannot be controlled.     Functional mobility during ADLs: Min guard       Vision Patient Visual Report: No change from baseline       Perception     Praxis       Pertinent Vitals/Pain Pain Assessment Pain Assessment: Faces Faces Pain Scale: Hurts a little bit Pain Location: generalized Pain Descriptors / Indicators: Discomfort Pain Intervention(s): Monitored during session     Hand Dominance Right   Extremity/Trunk Assessment Upper Extremity Assessment Upper Extremity Assessment: Overall WFL for tasks assessed   Lower Extremity Assessment Lower Extremity Assessment: Defer to PT evaluation   Cervical / Trunk Assessment Cervical / Trunk Assessment: Normal   Communication Communication Communication: No difficulties   Cognition Arousal/Alertness: Awake/alert Behavior During Therapy: WFL for tasks assessed/performed Overall Cognitive Status: Within Functional Limits for tasks assessed                                                  Home Living Family/patient expects to be discharged to:: Private residence Living Arrangements: Spouse/significant other Available Help at Discharge: Family;Available 24 hours/day Type of Home: House Home Access: Stairs to enter [employer] of Steps: 1   Home Layout: One level     Bathroom Shower/Tub: Chief Strategy Officer: Handicapped height     Home Equipment: None          Prior Functioning/Environment Prior Level of Function : Independent/Modified Independent;Working/employed             Mobility Comments: Independent without DME, self-employed as hairdresser ADLs Comments: indep, stands to shower        OT Problem List: Cardiopulmonary status limiting activity      OT Treatment/Interventions: Self-care/ADL training;DME and/or AE instruction;Patient/family education;Therapeutic activities    OT Goals(Current goals can be found in the care plan section) Acute Rehab OT Goals OT Goal Formulation: All assessment and education complete, DC therapy  OT Frequency: Min 1X/week    Co-evaluation              AM-PAC OT "6 Clicks"  Daily Activity     Outcome Measure Help from another person eating meals?: None Help from another person taking care of personal grooming?: A Little Help from another person toileting, which includes using toliet, bedpan, or urinal?: A Little Help from another person bathing (including washing, rinsing, drying)?: A Little Help from another person to put on and taking off regular upper body clothing?: A Little Help from another person to put on and taking off regular lower body clothing?: A Little 6 Click Score: 19   End of Session Equipment Utilized During Treatment: Oxygen Nurse Communication: Mobility status  Activity Tolerance: Treatment limited secondary to agitation Patient left: in chair;with call bell/phone within reach  OT Visit Diagnosis: Muscle weakness (generalized) (M62.81)                Time: 6433-2951 OT Time Calculation (min): 27 min Charges:  OT General Charges $OT Visit: 1 Visit OT Evaluation $OT Eval Low Complexity: 1 Low  Shamira Toutant, OTR/L Acute Care Rehab Services  Office [phone] Pager: [phone]  Kathan Kirker L Amalea Ottey 12/02/2021, 2:56 PM

## 2021-12-02 NOTE — Progress Notes (Signed)
ANTICOAGULATION CONSULT NOTE - Initial Consult  Pharmacy Consult for lovenox Indication: pulmonary embolus  Allergies  Allergen Reactions   Atorvastatin Other (See Comments)    SOB   Bactrim [Sulfamethoxazole-Trimethoprim] Other (See Comments)    Mouth will be like metal   Celebrex [Celecoxib] Diarrhea   Codeine Nausea And Vomiting   Dilaudid [Hydromorphone Hcl] Nausea And Vomiting   Hydrochlorothiazide Other (See Comments)    CANNOT VOID   Meloxicam Other (See Comments)    FLU LIKE SYMPTOMS   Morphine And Related Nausea And Vomiting   Tramadol     unknown   Wound Dressing Adhesive    Amoxicillin-Pot Clavulanate Diarrhea   Other Hives and Rash    emysin   Wellbutrin [Bupropion] Swelling and Rash    Patient Measurements: Height: 5\' 2"  (157.5 cm) Weight: 108.1 kg (238 lb 5.1 oz) IBW/kg (Calculated) : 50.1 Heparin Dosing Weight: 108 kg  Vital Signs: Temp: 97.7 F (36.5 C) (08/03 0829) Temp Source: Oral (08/03 0829) BP: 129/65 (08/03 0829) Pulse Rate: 81 (08/03 0829)  Labs: Recent Labs    11/29/21 1928 11/30/21 0323 11/30/21 0323 12/01/21 0133 12/02/21 0511  HGB  --  11.7*   < > 11.3* 12.2  HCT  --  34.3*  --  33.6* 37.3  PLT  --  159  --  166 193  HEPARINUNFRC 0.23* 0.35  --   --   --   CREATININE  --  0.76  --  0.72 0.63   < > = values in this interval not displayed.    Estimated Creatinine Clearance: 84.4 mL/min (by C-G formula based on SCr of 0.63 mg/dL).   Medical History: Past Medical History:  Diagnosis Date   Arthritis    Diabetes (HCC)     Medications:  Medications Prior to Admission  Medication Sig Dispense Refill Last Dose   cetirizine (ZYRTEC) 10 MG chewable tablet Chew 10 mg by mouth daily.   Past Week   glimepiride (AMARYL) 4 MG tablet Take 4 mg by mouth daily.   Past Week   losartan (COZAAR) 50 MG tablet   11 Past Week   misoprostol (CYTOTEC) 100 MCG tablet Take 100 mcg by mouth daily as needed (For Inflammation per spouse).   Past  Week   nitrofurantoin (MACRODANTIN) 100 MG capsule Take 100 mg by mouth daily as needed (For UTI).   unknown   TRULICITY 1.5 MG/0.5ML SOPN Inject 1.5 mg into the skin daily.   11/26/2021   verapamil (CALAN-SR) 120 MG CR tablet   11 Past Week    Assessment: 61 YOF admitted for PNA 2/2 COVID-19 with elevated D-Dimer (10.4 on 11/27/2021) scheduled for a CTA to assess for PE. Consult for therapeutic lovenox received   Goal of Therapy:  Monitor platelets by anticoagulation protocol: Yes   Plan:  Lovenox 108 mg (1 mg/kg) [place] q12h Monitor for signs of bleeding. F/u with results of CTA.  Continue lovenox treatment dose until D-Dimer begins to trend downward. Discussed case with Dr. 11/29/2021 Pharmacy will sign off of consult but continue to monitor and make recommendations PRN  Thedore Mins BS, PharmD, BCPS Clinical Pharmacist 12/02/2021 9:49 AM  Contact: [phone] after 3 PM  "Be curious, not judgmental..." -[phone]

## 2021-12-02 NOTE — Progress Notes (Signed)
Physical Therapy Treatment Patient Details Name: Alejandra [name] MRN: [id number] DOB: [birth year] Today's Date: 12/02/2021   History of Present Illness Pt is a 61 y.o. female admitted 11/27/21 with AMS, SOB, recent exposure to COVID. Workup for (+) COVID with multifocal PNA, septic shock. PMH includes DM2, OSA, arthritis, obesity.    PT Comments    Pt was very agitated and vocal about her reported poor care while here in the hospital, states ambulation is pointless especially if she has uncontrolled bowel movements, and mocked performing therapeutic exercise. PT explained at nauseum the importance of mobility for pulmonary health, progressing activity tolerance, maintaining strength. Pt still unable to be encouraged to do anything beyond transfer to and from [facility]. Pt maintained sats 88-94% on 9LO2 for the very limited mobility she did complete, and required assist from PT for pericare after BM.   Will continue to attempt to mobilize with pt.      Recommendations for follow up therapy are one component of a multi-disciplinary discharge planning process, led by the attending physician.  Recommendations may be updated based on patient status, additional functional criteria and insurance authorization.  Follow Up Recommendations  No PT follow up     Assistance Recommended at Discharge Set up Supervision/Assistance  Patient can return home with the following Assistance with cooking/housework;Assist for transportation;Help with stairs or ramp for entrance   Equipment Recommendations  None recommended by PT    Recommendations for Other Services       Precautions / Restrictions Precautions Precautions: Other (comment) Precaution Comments: watch SpO2 Restrictions Weight Bearing Restrictions: No     Mobility  Bed Mobility               General bed mobility comments: up in recilner    Transfers Overall transfer level: Needs assistance Equipment used: 1 person hand held  assist Transfers: Sit to/from Stand, Bed to chair/wheelchair/BSC Sit to Stand: Min guard   Step pivot transfers: Min guard       General transfer comment: for safety only as well as lines and leads, increased time. PT assist for cleanup s/p BM    Ambulation/Gait               General Gait Details: pt adamantly refuses   Stairs             Wheelchair Mobility    Modified Rankin (Stroke Patients Only)       Balance Overall balance assessment: Mild deficits observed, not formally tested                                          Cognition Arousal/Alertness: Awake/alert Behavior During Therapy: Agitated Overall Cognitive Status: Within Functional Limits for tasks assessed                                          Exercises      General Comments General comments (skin integrity, edema, etc.): 9LO2, SpO2 88-94% during mobility.      Pertinent Vitals/Pain Pain Assessment Pain Assessment: Faces Pain Location: generalized Pain Descriptors / Indicators: Discomfort Pain Intervention(s): Monitored during session, Patient requesting pain meds-RN notified    Home Living Family/patient expects to be discharged to:: Private residence Living Arrangements: Spouse/significant other Available Help at Discharge: Family;Available 24 hours/day Type  of Home: House Home Access: Stairs to enter   [employer] of Steps: 1   Home Layout: One level Home Equipment: None      Prior Function            PT Goals (current goals can now be found in the care plan section) Acute Rehab PT Goals Patient Stated Goal: get lines off PT Goal Formulation: With patient Time For Goal Achievement: 12/14/21 Potential to Achieve Goals: Good Progress towards PT goals: Progressing toward goals    Frequency    Min 3X/week      PT Plan Current plan remains appropriate    Co-evaluation              AM-PAC PT "6 Clicks"  Mobility   Outcome Measure  Help needed turning from your back to your side while in a flat bed without using bedrails?: None Help needed moving from lying on your back to sitting on the side of a flat bed without using bedrails?: None Help needed moving to and from a bed to a chair (including a wheelchair)?: A Little Help needed standing up from a chair using your arms (e.g., wheelchair or bedside chair)?: A Little Help needed to walk in hospital room?: A Little Help needed climbing 3-5 steps with a railing? : A Little 6 Click Score: 20    End of Session Equipment Utilized During Treatment: Oxygen Activity Tolerance: Treatment limited secondary to agitation Patient left: in chair;with call bell/phone within reach Nurse Communication: Mobility status PT Visit Diagnosis: Other abnormalities of gait and mobility (R26.89)     Time: 1420-1501 PT Time Calculation (min) (ACUTE ONLY): 41 min  Charges:  $Therapeutic Activity: 23-37 mins                     Marye Round, PT DPT Acute Rehabilitation Services Pager [phone]  Office [phone]    [name] 12/02/2021, 4:13 PM

## 2021-12-02 NOTE — Progress Notes (Signed)
PROGRESS NOTE                                                                                                                                                                                                             Patient Demographics:    Alejandra [name], is a 61 y.o. female, DOB - [birth year], KGS:[id number]  Outpatient Primary MD for the patient is Clovis Riley, L.August Saucer, MD    LOS - 5  Admit date - 11/27/2021    Chief Complaint  Patient presents with   Altered Mental Status   Respiratory Distress       Brief Narrative (HPI from H&P)   61 y.o. female with a PMH significant for arthritis, DMII, obesity and OSA non-compliant with CPAP who presented to the ED with SOB.  Reports recently returned from trip with family and majority of people traveling with her tested positive for COVID, she developed symptoms for COVID on 11/26/2021, she became short of breath and confused was brought to the ER and diagnosed with septic shock, AMS, acute hypoxic respiratory failure, COVID and bacterial pneumonia, she was admitted by PCCM.  She has been transferred to my service on 12/02/2021 on day 5 of hospital stay.  She is currently on 9 L of oxygen however looks like she was requiring frequent use of BiPAP during her first 4 days of hospital, has quite a bit of orthopnea still does not feel good.   Subjective:    Alejandra [name] today has, No headache, No chest pain, No abdominal pain - No Nausea, No new weakness tingling or numbness, ++ SOB and orthopnea   Assessment  & Plan :    Acute hypoxic respiratory failure due to COVID-19 pneumonia along with bacterial pneumonia. She had severe disease with elevated CRP, D-dimer and procalcitonin.  She has completed her remdesivir treatment in ICU, on IV Decadron which I will switch to Solu-Medrol, antibiotics adjusted for possibility of a bacterial component in her infection with elevated procalcitonin.  She  also has evidence of fluid overload and has significant orthopnea hence I will initiate diuresis with IV Lasix.  Encouraged to sit up in chair use I-S and flutter valve for pulmonary toiletry.  Gently advance activity and titrate down oxygen as appropriate.  Elevated CRP and significantly elevated D-dimer.  Likely due to inflammation from COVID-19 infection, VQ scan  was intermediate probability with elevated D-dimer the risk of either having a clot or developing a blood clot is very high, will switch to full dose Lovenox, trend D-dimer, extremity venous duplex is negative.  Septic shock.  At the time of admission required Levophed.  Now resolved.  Toxic and metabolic encephalopathy.  Resolved.  Acute on chronic diastolic CHF EF 60%.  Diurese and monitor.  Morbid obesity with OSA.  BMI of 43, follow with PCP for weight loss, noncompliant with CPAP at home, counseled, tolerating BiPAP.  Nightly.  Counseled to use.  AKI.  Resolved.  DM type II.  On Lantus and sliding scale.  Monitor and adjust.  Lab Results  Component Value Date   HGBA1C 7.2 (H) 11/27/2021   CBG (last 3)  Recent Labs    12/01/21 2358 12/02/21 0334 12/02/21 0833  GLUCAP 179* 191* 153*         Condition -  Guarded  Family Communication  :  None present  Code Status :  Full  Consults  :  PCCM  PUD Prophylaxis : PPI   Procedures  :     TTE -  1. Left ventricular ejection fraction, by estimation, is 55 to 60%. The left ventricle has normal function. The left ventricle has no regional wall motion abnormalities. Left ventricular diastolic parameters were normal.  2. Right ventricular systolic function is normal. The right ventricular size is normal. Tricuspid regurgitation signal is inadequate for assessing PA pressure.  3. The mitral valve is normal in structure. Mild to moderate mitral valve regurgitation.  4. The aortic valve was not well visualized. Aortic valve regurgitation is not visualized. No aortic  stenosis is present.  5. The inferior vena cava is normal in size with greater than 50% respiratory variability, suggesting right atrial pressure of 3 mmHg.   VQ - Intermidiate probability PE study.  Leg [place] - No DVT      Disposition Plan  :    Status is: Inpatient  DVT Prophylaxis  :  Lovenox  Place TED hose Start: 12/02/21 0927 heparin injection 5,000 Units Start: 11/30/21 1400    Lab Results  Component Value Date   PLT 193 12/02/2021    Diet :  Diet Order             Diet Carb Modified Fluid consistency: Thin; Room service appropriate? Yes  Diet effective now                    Inpatient Medications  Scheduled Meds:  aspirin EC  81 mg Oral Daily   Chlorhexidine Gluconate Cloth  6 each Topical Daily   dexamethasone  6 mg Oral Daily   furosemide  60 mg Intravenous Once   heparin injection (subcutaneous)  5,000 Units Subcutaneous Q8H   insulin aspart  0-15 Units Subcutaneous Q4H   insulin glargine-yfgn  10 Units Subcutaneous BID   levofloxacin  750 mg Oral Daily   linezolid  600 mg Oral Q12H   mupirocin ointment   Nasal BID   mouth rinse  15 mL Mouth Rinse 4 times per day   tamsulosin  0.4 mg Oral Daily   Continuous Infusions:  sodium chloride     sodium chloride     PRN Meds:.Place/Maintain arterial line **AND** sodium chloride, acetaminophen, guaiFENesin-dextromethorphan, HYDROcodone-acetaminophen, ipratropium-albuterol, ondansetron (ZOFRAN) IV, mouth rinse, sodium chloride, white petrolatum  Time Spent in minutes  30   Susa Raring M.D on 12/02/2021 at 9:43 AM  To page go to  [email or web address]   [facility] -  Office  [phone]  See all Orders from today for further details    Objective:   Vitals:   12/02/21 0025 12/02/21 0340 12/02/21 0500 12/02/21 0829  BP:  (!) 98/53  129/65  Pulse:  78  81  Resp:  19  17  Temp:  98.3 F (36.8 C)  97.7 F (36.5 C)  TempSrc:  Oral  Oral  SpO2: 93% 93%  92%  Weight:   108.1 kg   Height:         Wt Readings from Last 3 Encounters:  12/02/21 108.1 kg     Intake/Output Summary (Last 24 hours) at 12/02/2021 0943 Last data filed at 12/01/2021 1200 Gross per 24 hour  Intake 589.49 ml  Output 325 ml  Net 264.49 ml     Physical Exam  Awake Alert, No new F.N deficits, Normal affect [place].AT,PERRAL Supple Neck, No JVD,   Symmetrical Chest wall movement, Good air movement bilaterally, ++ rales RRR,No Gallops,Rubs or new Murmurs,  +ve B.Sounds, Abd Soft, No tenderness,   No Cyanosis, Clubbing or edema      Data Review:    CBC Recent Labs  Lab 11/28/21 0406 11/29/21 0336 11/29/21 0408 11/30/21 0323 12/01/21 0133 12/02/21 0511  WBC 15.8* 13.9*  --  17.5* 17.0* 15.7*  HGB 13.5 12.1 11.6* 11.7* 11.3* 12.2  HCT 38.7 34.8* 34.0* 34.3* 33.6* 37.3  PLT 150 145*  --  159 166 193  MCV 88.4 88.5  --  89.6 91.8 92.6  MCH 30.8 30.8  --  30.5 30.9 30.3  MCHC 34.9 34.8  --  34.1 33.6 32.7  RDW 13.7 13.7  --  13.7 13.8 13.7  LYMPHSABS 0.2* 0.6*  --  0.7 0.8 1.3  MONOABS 0.2 0.2  --  0.2 0.3 0.6  EOSABS 0.0 0.0  --  0.0 0.0 0.0  BASOSABS 0.0 0.1  --  0.1 0.0 0.0    Electrolytes Recent Labs  Lab 11/27/21 1550 11/27/21 1601 11/27/21 1738 11/27/21 1912 11/27/21 2306 11/28/21 0406 11/28/21 1159 11/29/21 0336 11/29/21 0408 11/30/21 0323 12/01/21 0133 12/02/21 0511 12/02/21 0646  NA 137  --    < >  --   --  132*  --  131* 132* 137 135 136  --   K 3.7  --    < >  --   --  3.8  --  3.6 3.6 3.7 3.8 4.0  --   CL 99  --   --   --   --  100  --  98  --  103 102 102  --   CO2 23  --   --   --   --  19*  --  23  --  26 24 26   --   GLUCOSE 195*  --   --   --   --  310*  --  223*  --  169* 277* 167*  --   BUN 12  --   --   --   --  20  --  24*  --  17 16 14   --   CREATININE 2.70*  --   --  2.61*  --  1.66*  --  0.94  --  0.76 0.72 0.63  --   CALCIUM 8.2*  --   --   --   --  7.2*  --  6.9*  --  7.4* 7.3* 7.4*  --   AST 41  --   --   --   --  54*  --  55*  --  34 20 19  --   ALT  24  --   --   --   --  26  --  27  --  --   ALKPHOS 67  --   --   --   --  50  --  52  --  58 55 78  --   BILITOT 0.7  --   --   --   --  0.5  --  0.8  --  0.4 0.6 1.0  --   ALBUMIN 3.1*  --   --   --   --  2.5*  --  2.3*  --  2.2* 2.3* 2.3*  --   MG  --   --   --   --   --  1.0*  --  1.9  --  2.8* 2.6* 2.3  --   CRP  --   --   --   --   --   --   --   --   --   --   --   --  12.2*  DDIMER 10.40*  --   --   --   --   --   --   --   --   --   --   --   --   PROCALCITON  --   --   --  >150.00  --  137.50  --  80.08  --   --   --  8.34  --   LATICACIDVEN 6.2*  --   --  5.2* 4.0*  --  3.5*  --   --   --   --   --   --   INR 1.1  --   --   --   --   --   --   --   --   --   --   --   --   HGBA1C  --   --   --  7.2*  --   --   --   --   --   --   --   --   --   BNP  --  141.4*  --   --   --   --   --   --   --   --   --  251.1*  --    < > = values in this interval not displayed.    ------------------------------------------------------------------------------------------------------------------ Recent Labs    11/30/21 0323  CHOL 73  HDL 25*  LDLCALC 32  TRIG 81  CHOLHDL 2.9    Lab Results  Component Value Date   HGBA1C 7.2 (H) 11/27/2021    No results for input(s): "TSH", "T4TOTAL", "T3FREE", "THYROIDAB" in the last 72 hours.  Invalid input(s): "FREET3" ------------------------------------------------------------------------------------------------------------------ ID Labs Recent Labs  Lab 11/27/21 1550 11/27/21 1912 11/27/21 2306 11/28/21 0406 11/28/21 1159 11/29/21 0336 11/30/21 0323 12/01/21 0133 12/02/21 0511 12/02/21 0646  WBC 6.6 10.4  --  15.8*  --  13.9* 17.5* 17.0* 15.7*  --   PLT 172 153  --  150  --  145* 159 166 193  --   CRP  --   --   --   --   --   --   --   --   --  12.2*  DDIMER 10.40*  --   --   --   --   --   --   --   --   --  PROCALCITON  --  >150.00  --  137.50  --  80.08  --   --  8.34  --   LATICACIDVEN 6.2* 5.2* 4.0*  --  3.5*  --    --   --   --   --   CREATININE 2.70* 2.61*  --  1.66*  --  0.94 0.76 0.72 0.63  --     Radiology Reports DG Chest Port 1 View  Result Date: 12/02/2021 CLINICAL DATA:  COVID EXAM: PORTABLE CHEST 1 VIEW COMPARISON:  11/28/2021 FINDINGS: Unchanged patchy bilateral airspace disease. Cardiomegaly. No visible effusion or pneumothorax. IMPRESSION: Unchanged multifocal pneumonia. Electronically Signed   By: Tiburcio Pea M.D.   On: 12/02/2021 06:32   VAS [place] LOWER EXTREMITY VENOUS (DVT)  Result Date: 11/30/2021  Lower Venous DVT Study Patient Name:  RASHEIDA BRODEN  Date of Exam:   11/29/2021 Medical Rec #: [id number]        Accession #:    [id number] Date of Birth: [birth year]        Patient Gender: F Patient Age:   51 years Exam Location:  [facility] Procedure:      VAS [place] LOWER EXTREMITY VENOUS (DVT) Referring Phys: Alphonzo Lemmings HARRIS --------------------------------------------------------------------------------  Indications: COVID. Elevated D-dimer 10.4.  Limitations: Body habitus and poor ultrasound/tissue interface. Comparison Study: No prior study Performing Technologist: Gertie Fey MHA, RDMS, RVT, RDCS  Examination Guidelines: A complete evaluation includes B-mode imaging, spectral Doppler, color Doppler, and power Doppler as needed of all accessible portions of each vessel. Bilateral testing is considered an integral part of a complete examination. Limited examinations for reoccurring indications may be performed as noted. The reflux portion of the exam is performed with the patient in reverse Trendelenburg.  +---------+---------------+---------+-----------+----------+--------------+ RIGHT    CompressibilityPhasicitySpontaneityPropertiesThrombus Aging +---------+---------------+---------+-----------+----------+--------------+ FV Prox  Full                                                        +---------+---------------+---------+-----------+----------+--------------+ FV Mid    Full                                                        +---------+---------------+---------+-----------+----------+--------------+ FV DistalFull                                                        +---------+---------------+---------+-----------+----------+--------------+ POP      Full           Yes      Yes                                 +---------+---------------+---------+-----------+----------+--------------+ PTV      Full                    Yes                                 +---------+---------------+---------+-----------+----------+--------------+  PERO     Full                    Yes                                 +---------+---------------+---------+-----------+----------+--------------+   Right Technical Findings: Not visualized segments include CFV, SFJ, PFV.  +---------+---------------+---------+-----------+----------+--------------+ LEFT     CompressibilityPhasicitySpontaneityPropertiesThrombus Aging +---------+---------------+---------+-----------+----------+--------------+ CFV      Full           Yes      Yes                                 +---------+---------------+---------+-----------+----------+--------------+ FV Prox  Full                                                        +---------+---------------+---------+-----------+----------+--------------+ FV Mid   Full                                                        +---------+---------------+---------+-----------+----------+--------------+ FV DistalFull                                                        +---------+---------------+---------+-----------+----------+--------------+ PFV      Full                                                        +---------+---------------+---------+-----------+----------+--------------+ POP      Full           Yes      Yes                                  +---------+---------------+---------+-----------+----------+--------------+ PTV      Full                    Yes                                 +---------+---------------+---------+-----------+----------+--------------+ PERO     Full                    Yes                                 +---------+---------------+---------+-----------+----------+--------------+   Left Technical Findings: Not visualized segments include SFJ.   Summary: RIGHT: - There is no evidence of deep vein thrombosis in the lower extremity. However, portions of this examination were limited- see technologist comments above.  - No cystic structure  found in the popliteal fossa.  LEFT: - There is no evidence of deep vein thrombosis in the lower extremity. However, portions of this examination were limited- see technologist comments above.  - No cystic structure found in the popliteal fossa.  *See table(s) above for measurements and observations. Electronically signed by Gerarda Fraction on 11/30/2021 at 12:48:47 PM.    Final    NM PULMONARY VENT AND PERF (V/Q Scan)  Result Date: 11/28/2021 CLINICAL DATA:  Chest pain. EXAM: NUCLEAR MEDICINE PERFUSION LUNG SCAN TECHNIQUE: Perfusion images were obtained in multiple projections after intravenous injection of radiopharmaceutical. Ventilation scans intentionally deferred if perfusion scan and chest x-ray adequate for interpretation during COVID 19 epidemic. RADIOPHARMACEUTICALS:  4.4 mCi Tc-73m MAA IV COMPARISON:  Chest radiograph dated 11/29/2018. FINDINGS: There are bilateral perfusion defects which correspond to the areas of airspace opacity on the chest radiograph. Evaluation for PE on perfusion scan is limited in the presence of airspace disease. CT angiography may provide better evaluation if there is high clinical concern for acute PE. IMPRESSION: Indeterminate for pulmonary embolism. Electronically Signed   By: Elgie Collard M.D.   On: 11/28/2021 20:15   DG Chest Port 1  View  Result Date: 11/28/2021 CLINICAL DATA:  Chest pain EXAM: PORTABLE CHEST 1 VIEW COMPARISON:  November 27, 2021 FINDINGS: Bilateral pulmonary infiltrates. The right-sided infiltrates have worsened in the interval. The left-sided infiltrates are stable. No other changes. No pneumothorax. IMPRESSION: Worsening of right patchy pulmonary infiltrates. Stable left pulmonary infiltrates. No other interval changes. Electronically Signed   By: Gerome Sam III M.D.   On: 11/28/2021 17:08   ECHOCARDIOGRAM LIMITED  Result Date: 11/28/2021    ECHOCARDIOGRAM LIMITED REPORT   Patient Name:   ANANIAH MACIOLEK Date of Exam: 11/28/2021 Medical Rec #:  [id number]       Height:       62.0 in Accession #:    [id number]      Weight:       227.7 lb Date of Birth:  [birth year]       BSA:          2.020 m Patient Age:    61 years        BP:           89/48 mmHg Patient Gender: F               HR:           90 bpm. Exam Location:  Inpatient Procedure: Limited Color Doppler and Cardiac Doppler Indications:    chest pain  History:        Patient has no prior history of Echocardiogram examinations.                 Covid.; Risk Factors:Sleep Apnea and Diabetes.  Sonographer:    Delcie Roch RDCS Referring Phys: [id number] [facility] D HARRIS  Sonographer Comments: Bi-pap and Image acquisition challenging due to patient body habitus. IMPRESSIONS  1. Left ventricular ejection fraction, by estimation, is 55 to 60%. The left ventricle has normal function. The left ventricle has no regional wall motion abnormalities. Left ventricular diastolic parameters were normal.  2. Right ventricular systolic function is normal. The right ventricular size is normal. Tricuspid regurgitation signal is inadequate for assessing PA pressure.  3. The mitral valve is normal in structure. Mild to moderate mitral valve regurgitation.  4. The aortic valve was not well visualized. Aortic valve regurgitation is not visualized. No aortic stenosis is present.  5.  The  inferior vena cava is normal in size with greater than 50% respiratory variability, suggesting right atrial pressure of 3 mmHg. FINDINGS  Left Ventricle: Left ventricular ejection fraction, by estimation, is 55 to 60%. The left ventricle has normal function. The left ventricle has no regional wall motion abnormalities. The left ventricular internal cavity size was normal in size. There is  no left ventricular hypertrophy. Left ventricular diastolic parameters were normal. Normal left ventricular filling pressure. Right Ventricle: The right ventricular size is normal. No increase in right ventricular wall thickness. Right ventricular systolic function is normal. Tricuspid regurgitation signal is inadequate for assessing PA pressure. Left Atrium: Left atrial size was normal in size. Right Atrium: Right atrial size was normal in size. Pericardium: There is no evidence of pericardial effusion. Mitral Valve: The mitral valve is normal in structure. Mild to moderate mitral valve regurgitation, with centrally-directed jet. Tricuspid Valve: The tricuspid valve is normal in structure. Tricuspid valve regurgitation is not demonstrated. Aortic Valve: The aortic valve was not well visualized. Aortic valve regurgitation is not visualized. No aortic stenosis is present. Pulmonic Valve: The pulmonic valve was not well visualized. Pulmonic valve regurgitation is not visualized. Aorta: The aortic root is normal in size and structure. Venous: The inferior vena cava is normal in size with greater than 50% respiratory variability, suggesting right atrial pressure of 3 mmHg. IAS/Shunts: The interatrial septum was not assessed. LEFT VENTRICLE PLAX 2D LVIDd:         4.20 cm     Diastology LVIDs:         3.00 cm     LV e' medial:    8.49 cm/s LV PW:         0.90 cm     LV E/e' medial:  10.7 LV IVS:        0.80 cm     LV e' lateral:   10.20 cm/s                            LV E/e' lateral: 8.9  LV Volumes (MOD) LV vol d, MOD A4C: 71.3 ml LV  vol s, MOD A4C: 41.4 ml LV SV MOD A4C:     71.3 ml IVC IVC diam: 1.70 cm LEFT ATRIUM         Index LA diam:    3.10 cm 1.53 cm/m  AORTIC VALVE LVOT Vmax:   123.00 cm/s LVOT Vmean:  75.600 cm/s LVOT VTI:    0.196 m  AORTA Ao Asc diam: 2.90 cm MITRAL VALVE MV Area (PHT): 4.31 cm    SHUNTS MV Decel Time: 176 msec    Systemic VTI: 0.20 m MV E velocity: 90.90 cm/s MV A velocity: 73.90 cm/s MV E/A ratio:  1.23 Mihai Croitoru MD Electronically signed by Thurmon Fair MD Signature Date/Time: 11/28/2021/5:05:08 PM    Final

## 2021-12-03 ENCOUNTER — Inpatient Hospital Stay (HOSPITAL_COMMUNITY): Payer: 59

## 2021-12-03 ENCOUNTER — Encounter (HOSPITAL_COMMUNITY): Payer: Self-pay | Admitting: Pulmonary Disease

## 2021-12-03 DIAGNOSIS — U071 COVID-19: Secondary | ICD-10-CM | POA: Diagnosis not present

## 2021-12-03 DIAGNOSIS — J1282 Pneumonia due to coronavirus disease 2019: Secondary | ICD-10-CM | POA: Diagnosis not present

## 2021-12-03 LAB — CBC
HCT: 35.8 % — ABNORMAL LOW (ref 36.0–46.0)
Hemoglobin: 11.8 g/dL — ABNORMAL LOW (ref 12.0–15.0)
MCH: 30.3 pg (ref 26.0–34.0)
MCHC: 33 g/dL (ref 30.0–36.0)
MCV: 91.8 fL (ref 80.0–100.0)
Platelets: 237 10*3/uL (ref 150–400)
RBC: 3.9 MIL/uL (ref 3.87–5.11)
RDW: 13.7 % (ref 11.5–15.5)
WBC: 13 10*3/uL — ABNORMAL HIGH (ref 4.0–10.5)
nRBC: 0.2 % (ref 0.0–0.2)

## 2021-12-03 LAB — BASIC METABOLIC PANEL
Anion gap: 8 (ref 5–15)
BUN: 15 mg/dL (ref 8–23)
CO2: 28 mmol/L (ref 22–32)
Calcium: 7.8 mg/dL — ABNORMAL LOW (ref 8.9–10.3)
Chloride: 101 mmol/L (ref 98–111)
Creatinine, Ser: 0.68 mg/dL (ref 0.44–1.00)
GFR, Estimated: >60 mL/min (ref >60)
Glucose, Bld: 132 mg/dL — ABNORMAL HIGH (ref 70–99)
Potassium: 4.3 mmol/L (ref 3.5–5.1)
Sodium: 137 mmol/L (ref 135–145)

## 2021-12-03 LAB — MAGNESIUM: Magnesium: 2.3 mg/dL (ref 1.7–2.4)

## 2021-12-03 LAB — GLUCOSE, CAPILLARY
Glucose-Capillary: 158 mg/dL — ABNORMAL HIGH (ref 70–99)
Glucose-Capillary: 127 mg/dL — ABNORMAL HIGH (ref 70–99)
Glucose-Capillary: 107 mg/dL — ABNORMAL HIGH (ref 70–99)
Glucose-Capillary: 204 mg/dL — ABNORMAL HIGH (ref 70–99)
Glucose-Capillary: 336 mg/dL — ABNORMAL HIGH (ref 70–99)
Glucose-Capillary: 262 mg/dL — ABNORMAL HIGH (ref 70–99)

## 2021-12-03 LAB — D-DIMER, QUANTITATIVE: D-Dimer, Quant: 1.58 ug/mL-FEU — ABNORMAL HIGH (ref 0.00–0.50)

## 2021-12-03 LAB — PROCALCITONIN: Procalcitonin: 4.53 ng/mL

## 2021-12-03 LAB — BRAIN NATRIURETIC PEPTIDE: B Natriuretic Peptide: 130 pg/mL — ABNORMAL HIGH (ref 0.0–100.0)

## 2021-12-03 MED ORDER — FLUTICASONE PROPIONATE 50 MCG/ACT NA SUSP
2.0000 | Freq: Two times a day (BID) | NASAL | Status: DC
  Administered 2021-12-03 – 2021-12-06 (×7): 2 via NASAL
  Filled 2021-12-03: qty 16

## 2021-12-03 MED ORDER — IOHEXOL 350 MG/ML SOLN
100.0000 mL | Freq: Once | INTRAVENOUS | Status: AC | PRN
  Administered 2021-12-03: 100 mL via INTRAVENOUS

## 2021-12-03 MED ORDER — FUROSEMIDE 10 MG/ML IJ SOLN
40.0000 mg | Freq: Once | INTRAMUSCULAR | Status: AC
  Administered 2021-12-03: 40 mg via INTRAVENOUS
  Filled 2021-12-03: qty 4

## 2021-12-03 MED ORDER — SPIRONOLACTONE 25 MG PO TABS
50.0000 mg | ORAL_TABLET | Freq: Once | ORAL | Status: AC
  Administered 2021-12-03: 50 mg via ORAL
  Filled 2021-12-03: qty 2

## 2021-12-03 MED ORDER — OXYMETAZOLINE HCL 0.05 % NA SOLN
1.0000 | Freq: Two times a day (BID) | NASAL | Status: AC
  Administered 2021-12-03 – 2021-12-05 (×3): 1 via NASAL
  Filled 2021-12-03: qty 30

## 2021-12-03 MED ORDER — NYSTATIN 100000 UNIT/GM EX POWD
Freq: Three times a day (TID) | CUTANEOUS | Status: DC
  Filled 2021-12-03: qty 15

## 2021-12-03 MED ORDER — ENOXAPARIN SODIUM 60 MG/0.6ML IJ SOSY
50.0000 mg | PREFILLED_SYRINGE | INTRAMUSCULAR | Status: DC
  Administered 2021-12-04 – 2021-12-06 (×3): 50 mg via SUBCUTANEOUS
  Filled 2021-12-03 (×3): qty 0.6

## 2021-12-03 MED ORDER — FLUCONAZOLE 100 MG PO TABS
100.0000 mg | ORAL_TABLET | Freq: Every day | ORAL | Status: AC
  Administered 2021-12-03 – 2021-12-05 (×3): 100 mg via ORAL
  Filled 2021-12-03 (×3): qty 1

## 2021-12-03 MED ORDER — DM-GUAIFENESIN ER 30-600 MG PO TB12
1.0000 | ORAL_TABLET | Freq: Two times a day (BID) | ORAL | Status: DC
  Administered 2021-12-03 – 2021-12-06 (×7): 1 via ORAL
  Filled 2021-12-03 (×7): qty 1

## 2021-12-03 NOTE — Progress Notes (Signed)
Pt is resting comfortably on 9L [place] at this time. PT does not not to use bipap at night due to sore nostrils.

## 2021-12-03 NOTE — Progress Notes (Signed)
PROGRESS NOTE                                                                                                                                                                                                             Patient Demographics:    Alejandra [name], is a 61 y.o. female, DOB - July 15, 1960, ZOX:[id number]  Outpatient Primary MD for the patient is Clovis Riley, L.August Saucer, MD    LOS - 6  Admit date - 11/27/2021    Chief Complaint  Patient presents with   Altered Mental Status   Respiratory Distress       Brief Narrative (HPI from H&P)   61 y.o. female with a PMH significant for arthritis, DMII, obesity and OSA non-compliant with CPAP who presented to the ED with SOB.  Reports recently returned from trip with family and majority of people traveling with her tested positive for COVID, she developed symptoms for COVID on 11/26/2021, she became short of breath and confused was brought to the ER and diagnosed with septic shock, AMS, acute hypoxic respiratory failure, COVID and bacterial pneumonia, she was admitted by PCCM.  She has been transferred to my service on 12/02/2021 on day 5 of hospital stay.  She is currently on 9 L of oxygen however looks like she was requiring frequent use of BiPAP during her first 4 days of hospital, has quite a bit of orthopnea still does not feel good.   Subjective:   Patient in chair, appears to be in no distress denies any headache chest or abdominal pain, shortness of breath has improved, still has some swelling in her legs.  Initially wanted to be transferred to [facility] when I offered the transfer she refused, she also wanted the monitor to be taken off when I offered the nurse to take it off she refused it again.  Daughter bedside.   Assessment  & Plan :    Acute hypoxic respiratory failure due to COVID-19 pneumonia along with bacterial pneumonia. She had severe disease with elevated CRP, D-dimer and  procalcitonin.  She has completed her remdesivir treatment in ICU, on IV Decadron which I will switch to Solu-Medrol, antibiotics adjusted for possibility of a bacterial component in her infection with elevated procalcitonin.  She also has evidence of fluid overload and has significant orthopnea hence I will initiate diuresis with IV Lasix,  continue she has improved with above intervention.  Encouraged to sit up in chair use I-S and flutter valve for pulmonary toiletry.  Gently advance activity and titrate down oxygen as appropriate.  Elevated CRP and significantly elevated D-dimer.  Likely due to inflammation from COVID-19 infection, with elevated D-dimer the risk of either having a clot or developing a blood clot is very high, have switched her to to full dose Lovenox, dimer trend is coming down we will also check CTA.  Lower extremity venous duplex was negative, V/Q was anterior mediate probability for PE.  Septic shock.  At the time of admission required Levophed.  Now resolved.  Toxic and metabolic encephalopathy.  Resolved.  Acute on chronic diastolic CHF EF [id number].  Diurese and monitor.  Mild elevation of troponin in non-ACS pattern likely due to demand ischemia from hypoxia, seen by cardiology, continue aspirin.  No other intervention or work-up recommended by cardiology.  Morbid obesity with OSA.  BMI of 43, follow with PCP for weight loss, noncompliant with CPAP at home, counseled, tolerating BiPAP.  Nightly.  Counseled to use.  Mild intermittent metabolic encephalopathy.  Stable avoid benzodiazepines and narcotics, as needed Haldol if needed.    AKI.  Resolved.  DM type II.  On Lantus and sliding scale.  Monitor and adjust.  Lab Results  Component Value Date   HGBA1C 7.2 (H) 11/27/2021   CBG (last 3)  Recent Labs    12/02/21 2318 12/03/21 0335 12/03/21 0803  GLUCAP 185* 158* 127*         Condition -  Guarded  Family Communication  : Daughter Christine bedside on  12/03/2021  Code Status :  Full  Consults  :  PCCM, cardiology  PUD Prophylaxis : PPI   Procedures  :     CTA -  TTE -  1. Left ventricular ejection fraction, by estimation, is 55 to 60%. The left ventricle has normal function. The left ventricle has no regional wall motion abnormalities. Left ventricular diastolic parameters were normal.  2. Right ventricular systolic function is normal. The right ventricular size is normal. Tricuspid regurgitation signal is inadequate for assessing PA pressure.  3. The mitral valve is normal in structure. Mild to moderate mitral valve regurgitation.  4. The aortic valve was not well visualized. Aortic valve regurgitation is not visualized. No aortic stenosis is present.  5. The inferior vena cava is normal in size with greater than 50% respiratory variability, suggesting right atrial pressure of 3 mmHg.   VQ - Intermidiate probability PE study.  Leg [place] - No DVT      Disposition Plan  :    Status is: Inpatient  DVT Prophylaxis  :  Lovenox  Place TED hose Start: 12/03/21 0904 Place TED hose Start: 12/02/21 0927    Lab Results  Component Value Date   PLT 237 12/03/2021    Diet :  Diet Order             Diet Carb Modified Fluid consistency: Thin; Room service appropriate? Yes  Diet effective now                    Inpatient Medications  Scheduled Meds:  aspirin EC  81 mg Oral Daily   Chlorhexidine Gluconate Cloth  6 each Topical Daily   dextromethorphan-guaiFENesin  1 tablet Oral BID   enoxaparin (LOVENOX) injection  1 mg/kg Subcutaneous Q12H   fluconazole  100 mg Oral Daily   fluticasone  2 spray Each  Nare BID   insulin aspart  0-15 Units Subcutaneous Q4H   insulin glargine-yfgn  10 Units Subcutaneous BID   levofloxacin  750 mg Oral Daily   linezolid  600 mg Oral Q12H   methylPREDNISolone (SOLU-MEDROL) injection  60 mg Intravenous Q24H   mupirocin ointment   Nasal BID   nystatin   Topical TID   mouth rinse  15 mL Mouth  Rinse 4 times per day   oxymetazoline  1 spray Each Nare BID   pantoprazole  40 mg Oral Daily   tamsulosin  0.4 mg Oral Daily   Continuous Infusions:  sodium chloride     sodium chloride     PRN Meds:.Place/Maintain arterial line **AND** sodium chloride, acetaminophen, ALPRAZolam, HYDROcodone-acetaminophen, ipratropium-albuterol, ondansetron (ZOFRAN) IV, mouth rinse, sodium chloride, white petrolatum  Time Spent in minutes  30   Lala Lund M.D on 12/03/2021 at 10:50 AM  To page go to [email or web address]   [facility] -  Office  [phone]  See all Orders from today for further details    Objective:   Vitals:   12/03/21 0207 12/03/21 0324 12/03/21 0333 12/03/21 0845  BP:   121/70   Pulse: 80  78 82  Resp: 18  20 18   Temp:   97.8 F (36.6 C)   TempSrc:   Oral   SpO2: 94%  93% 93%  Weight:  108.1 kg    Height:        Wt Readings from Last 3 Encounters:  12/03/21 108.1 kg     Intake/Output Summary (Last 24 hours) at 12/03/2021 1050 Last data filed at 12/02/2021 2300 Gross per 24 hour  Intake --  Output 500 ml  Net -500 ml     Physical Exam  Awake Alert, No new F.N deficits, Normal affect [place].AT,PERRAL Supple Neck, No JVD,   Symmetrical Chest wall movement, Good air movement bilaterally, + rales RRR,No Gallops, Rubs or new Murmurs,  +ve B.Sounds, Abd Soft, No tenderness,   No Cyanosis, 1+ edema    Data Review:    CBC Recent Labs  Lab 11/28/21 0406 11/29/21 0336 11/29/21 0408 11/30/21 0323 12/01/21 0133 12/02/21 0511 12/03/21 0522  WBC 15.8* 13.9*  --  17.5* 17.0* 15.7* 13.0*  HGB 13.5 12.1 11.6* 11.7* 11.3* 12.2 11.8*  HCT 38.7 34.8* 34.0* 34.3* 33.6* 37.3 35.8*  PLT 150 145*  --  159 166 193 237  MCV 88.4 88.5  --  89.6 91.8 92.6 91.8  MCH 30.8 30.8  --  30.5 30.9 30.3 30.3  MCHC 34.9 34.8  --  34.1 33.6 32.7 33.0  RDW 13.7 13.7  --  13.7 13.8 13.7 13.7  LYMPHSABS 0.2* 0.6*  --  0.7 0.8 1.3  --   MONOABS 0.2 0.2  --  0.2 0.3 0.6  --    EOSABS 0.0 0.0  --  0.0 0.0 0.0  --   BASOSABS 0.0 0.1  --  0.1 0.0 0.0  --     Electrolytes Recent Labs  Lab 11/27/21 1550 11/27/21 1550 11/27/21 1601 11/27/21 1738 11/27/21 1912 11/27/21 2306 11/28/21 0406 11/28/21 1159 11/29/21 0336 11/29/21 0408 11/30/21 0323 12/01/21 0133 12/02/21 0511 12/02/21 0646 12/02/21 0955 12/03/21 0522  NA 137  --   --    < >  --   --  132*  --  131* 132* 137 135 136  --   --  137  K 3.7  --   --    < >  --   --  3.8  --  3.6 3.6 3.7 3.8 4.0  --   --  4.3  CL 99  --   --   --   --   --  100  --  98  --  103 102 102  --   --  101  CO2 23  --   --   --   --   --  19*  --  23  --  26 24 26   --   --  28  GLUCOSE 195*  --   --   --   --   --  310*  --  223*  --  169* 277* 167*  --   --  132*  BUN 12  --   --   --   --   --  20  --  24*  --  17 16 14   --   --  15  CREATININE 2.70*  --   --   --  2.61*  --  1.66*  --  0.94  --  0.76 0.72 0.63  --   --  0.68  CALCIUM 8.2*  --   --   --   --   --  7.2*  --  6.9*  --  7.4* 7.3* 7.4*  --   --  7.8*  AST 41  --   --   --   --   --  54*  --  55*  --  34 20 19  --   --   --   ALT 24  --   --   --   --   --  26  --  27  --  24 22 20   --   --   --   ALKPHOS 67  --   --   --   --   --  50  --  52  --  58 55 78  --   --   --   BILITOT 0.7  --   --   --   --   --  0.5  --  0.8  --  0.4 0.6 1.0  --   --   --   ALBUMIN 3.1*  --   --   --   --   --  2.5*  --  2.3*  --  2.2* 2.3* 2.3*  --   --   --   MG  --    < >  --   --   --   --  1.0*  --  1.9  --  2.8* 2.6* 2.3  --   --  2.3  CRP  --   --   --   --   --   --   --   --   --   --   --   --   --  12.2*  --   --   DDIMER 10.40*  --   --   --   --   --   --   --   --   --   --   --   --   --  2.41* 1.58*  PROCALCITON  --   --   --   --  >150.00  --  137.50  --  80.08  --   --   --  8.34  --   --  4.53  LATICACIDVEN 6.2*  --   --   --  5.2* 4.0*  --  3.5*  --   --   --   --   --   --   --   --   INR 1.1  --   --   --   --   --   --   --   --   --   --   --   --   --    --   --   HGBA1C  --   --   --   --  7.2*  --   --   --   --   --   --   --   --   --   --   --   BNP  --   --  141.4*  --   --   --   --   --   --   --   --   --  251.1*  --   --  130.0*   < > = values in this interval not displayed.   ID Labs Recent Labs  Lab 11/27/21 1550 11/27/21 1912 11/27/21 2306 11/28/21 0406 11/28/21 1159 11/29/21 0336 11/30/21 0323 12/01/21 0133 12/02/21 0511 12/02/21 0646 12/02/21 0955 12/03/21 0522  WBC 6.6 10.4  --  15.8*  --  13.9* 17.5* 17.0* 15.7*  --   --  13.0*  PLT 172 153  --  150  --  145* 159 166 193  --   --  237  CRP  --   --   --   --   --   --   --   --   --  12.2*  --   --   DDIMER 10.40*  --   --   --   --   --   --   --   --   --  2.41* 1.58*  PROCALCITON  --  >150.00  --  137.50  --  80.08  --   --  8.34  --   --  4.53  LATICACIDVEN 6.2* 5.2* 4.0*  --  3.5*  --   --   --   --   --   --   --   CREATININE 2.70* 2.61*  --  1.66*  --  0.94 0.76 0.72 0.63  --   --  0.68    Radiology Reports DG Chest Port 1 View  Result Date: 12/03/2021 CLINICAL DATA:  Shortness of breath EXAM: PORTABLE CHEST 1 VIEW COMPARISON:  Yesterday FINDINGS: Patchy bilateral pneumonia which is unchanged. Cardiomegaly that is stable. No visible effusion or pneumothorax. IMPRESSION: Unchanged multifocal pneumonia. Electronically Signed   By: Jorje Guild M.D.   On: 12/03/2021 06:59   DG Chest Port 1 View  Result Date: 12/02/2021 CLINICAL DATA:  COVID EXAM: PORTABLE CHEST 1 VIEW COMPARISON:  11/28/2021 FINDINGS: Unchanged patchy bilateral airspace disease. Cardiomegaly. No visible effusion or pneumothorax. IMPRESSION: Unchanged multifocal pneumonia. Electronically Signed   By: Jorje Guild M.D.   On: 12/02/2021 06:32   VAS [place] LOWER EXTREMITY VENOUS (DVT)  Result Date: 11/30/2021  Lower Venous DVT Study Patient Name:  KAYLER PEERY  Date of Exam:   11/29/2021 Medical Rec #: [id number]        Accession #:    [id number] Date of Birth: Mar 09, 1961        Patient Gender:  F Patient Age:   26 years Exam Location[facility]  [facility] Procedure:  VAS [place] LOWER EXTREMITY VENOUS (DVT) Referring Phys: WHITNEY HARRIS --------------------------------------------------------------------------------  Indications: COVID. Elevated D-dimer 10.4.  Limitations: Body habitus and poor ultrasound/tissue interface. Comparison Study: No prior study Performing Technologist: Maudry Mayhew MHA, RDMS, RVT, RDCS  Examination Guidelines: A complete evaluation includes B-mode imaging, spectral Doppler, color Doppler, and power Doppler as needed of all accessible portions of each vessel. Bilateral testing is considered an integral part of a complete examination. Limited examinations for reoccurring indications may be performed as noted. The reflux portion of the exam is performed with the patient in reverse Trendelenburg.  +---------+---------------+---------+-----------+----------+--------------+ RIGHT    CompressibilityPhasicitySpontaneityPropertiesThrombus Aging +---------+---------------+---------+-----------+----------+--------------+ FV Prox  Full                                                        +---------+---------------+---------+-----------+----------+--------------+ FV Mid   Full                                                        +---------+---------------+---------+-----------+----------+--------------+ FV DistalFull                                                        +---------+---------------+---------+-----------+----------+--------------+ POP      Full           Yes      Yes                                 +---------+---------------+---------+-----------+----------+--------------+ PTV      Full                    Yes                                 +---------+---------------+---------+-----------+----------+--------------+ PERO     Full                    Yes                                  +---------+---------------+---------+-----------+----------+--------------+   Right Technical Findings: Not visualized segments include CFV, SFJ, PFV.  +---------+---------------+---------+-----------+----------+--------------+ LEFT     CompressibilityPhasicitySpontaneityPropertiesThrombus Aging +---------+---------------+---------+-----------+----------+--------------+ CFV      Full           Yes      Yes                                 +---------+---------------+---------+-----------+----------+--------------+ FV Prox  Full                                                        +---------+---------------+---------+-----------+----------+--------------+  FV Mid   Full                                                        +---------+---------------+---------+-----------+----------+--------------+ FV DistalFull                                                        +---------+---------------+---------+-----------+----------+--------------+ PFV      Full                                                        +---------+---------------+---------+-----------+----------+--------------+ POP      Full           Yes      Yes                                 +---------+---------------+---------+-----------+----------+--------------+ PTV      Full                    Yes                                 +---------+---------------+---------+-----------+----------+--------------+ PERO     Full                    Yes                                 +---------+---------------+---------+-----------+----------+--------------+   Left Technical Findings: Not visualized segments include SFJ.   Summary: RIGHT: - There is no evidence of deep vein thrombosis in the lower extremity. However, portions of this examination were limited- see technologist comments above.  - No cystic structure found in the popliteal fossa.  LEFT: - There is no evidence of deep vein thrombosis in the  lower extremity. However, portions of this examination were limited- see technologist comments above.  - No cystic structure found in the popliteal fossa.  *See table(s) above for measurements and observations. Electronically signed by Gerarda Fraction on 11/30/2021 at 12:48:47 PM.    Final

## 2021-12-03 NOTE — Progress Notes (Signed)
Progress Note  Patient Name: Alejandra [name] Date of Encounter: 12/03/2021  Primary Cardiologist:   None   Subjective   No chest pain.  Breathing slightly better.  Sitting up in a chair.    Inpatient Medications    Scheduled Meds:  aspirin EC  81 mg Oral Daily   Chlorhexidine Gluconate Cloth  6 each Topical Daily   dextromethorphan-guaiFENesin  1 tablet Oral BID   enoxaparin (LOVENOX) injection  1 mg/kg Subcutaneous Q12H   fluticasone  2 spray Each Nare BID   furosemide  40 mg Intravenous Once   insulin aspart  0-15 Units Subcutaneous Q4H   insulin glargine-yfgn  10 Units Subcutaneous BID   levofloxacin  750 mg Oral Daily   linezolid  600 mg Oral Q12H   methylPREDNISolone (SOLU-MEDROL) injection  60 mg Intravenous Q24H   mupirocin ointment   Nasal BID   nystatin   Topical TID   mouth rinse  15 mL Mouth Rinse 4 times per day   oxymetazoline  1 spray Each Nare BID   pantoprazole  40 mg Oral Daily   spironolactone  50 mg Oral Once   tamsulosin  0.4 mg Oral Daily   Continuous Infusions:  sodium chloride     sodium chloride     PRN Meds: Place/Maintain arterial line **AND** sodium chloride, acetaminophen, ALPRAZolam, HYDROcodone-acetaminophen, ipratropium-albuterol, ondansetron (ZOFRAN) IV, mouth rinse, sodium chloride, white petrolatum   Vital Signs    Vitals:   12/03/21 0207 12/03/21 0324 12/03/21 0333 12/03/21 0845  BP:   121/70   Pulse: 80  78 82  Resp: 18  20 18   Temp:   97.8 F (36.6 C)   TempSrc:   Oral   SpO2: 94%  93% 93%  Weight:  108.1 kg    Height:        Intake/Output Summary (Last 24 hours) at 12/03/2021 1012 Last data filed at 12/02/2021 2300 Gross per 24 hour  Intake --  Output 500 ml  Net -500 ml   Filed Weights   12/01/21 0412 12/02/21 0500 12/03/21 0324  Weight: 106.3 kg 108.1 kg 108.1 kg    ECG    NSR, rate 79, no acute ST T wave changes. - Personally Reviewed  Physical Exam   GEN: No  acute distress.   Neck: No  JVD Cardiac:  RRR, no murmurs, rubs, or gallops.  Respiratory:     Decreased breath sounds coarse crackles GI: Soft, nontender, non-distended, normal bowel sounds  MS:  Mild to moderate ankle edema; No deformity. Neuro:   Nonfocal  Psych: Oriented and appropriate    Labs    Chemistry Recent Labs  Lab 11/30/21 0323 12/01/21 0133 12/02/21 0511 12/03/21 0522  NA 137 135 136 137  K 3.7 3.8 4.0 4.3  CL 103 102 102 101  CO2 26 24 26 28   GLUCOSE 169* 277* 167* 132*  BUN 17 16 14 15   CREATININE 0.76 0.72 0.63 0.68  CALCIUM 7.4* 7.3* 7.4* 7.8*  PROT 5.9* 5.7* 5.9*  --   ALBUMIN 2.2* 2.3* 2.3*  --   AST 34 20 19  --   ALT 24 22 20   --   ALKPHOS 58 55 78  --   BILITOT 0.4 0.6 1.0  --   GFRNONAA >60 >60 >60 >60  ANIONGAP 8 9 8 8      Hematology Recent Labs  Lab 12/01/21 0133 12/02/21 0511 12/03/21 0522  WBC 17.0* 15.7* 13.0*  RBC 3.66* 4.03 3.90  HGB  11.3* 12.2 11.8*  HCT 33.6* 37.3 35.8*  MCV 91.8 92.6 91.8  MCH 30.9 30.3 30.3  MCHC 33.6 32.7 33.0  RDW 13.8 13.7 13.7  PLT 166 193 237    Cardiac EnzymesNo results for input(s): "TROPONINI" in the last 168 hours. No results for input(s): "TROPIPOC" in the last 168 hours.   BNP Recent Labs  Lab 11/27/21 1601 12/02/21 0511 12/03/21 0522  BNP 141.4* 251.1* 130.0*     DDimer  Recent Labs  Lab 11/27/21 1550 12/02/21 0955 12/03/21 0522  DDIMER 10.40* 2.41* 1.58*     Radiology    DG Chest Port 1 View  Result Date: 12/03/2021 CLINICAL DATA:  Shortness of breath EXAM: PORTABLE CHEST 1 VIEW COMPARISON:  Yesterday FINDINGS: Patchy bilateral pneumonia which is unchanged. Cardiomegaly that is stable. No visible effusion or pneumothorax. IMPRESSION: Unchanged multifocal pneumonia. Electronically Signed   By: Tiburcio Pea M.D.   On: 12/03/2021 06:59   DG Chest Port 1 View  Result Date: 12/02/2021 CLINICAL DATA:  COVID EXAM: PORTABLE CHEST 1 VIEW COMPARISON:  11/28/2021 FINDINGS: Unchanged patchy bilateral airspace disease.  Cardiomegaly. No visible effusion or pneumothorax. IMPRESSION: Unchanged multifocal pneumonia. Electronically Signed   By: Tiburcio Pea M.D.   On: 12/02/2021 06:32    Cardiac Studies   Echo:  see above  Patient Profile     61 y.o. female with a hx of arthritis, DM, obesity, OSA non-complaint with CPAP who is being seen 11/29/2021 for the evaluation of elevated troponin at the request of Dr. Francine Graven.  Assessment & Plan    Elevated Troponin:  Likely demand ischemia.   Trend down verified yesterday with no abnormalities on EKG.  I will schedule follow up in the clinic in about a month to consider an ischemia work up if her pulmonary issues are improved.  Otherwise please call as needed. I would continue low dose ASA although I do not see an indication for Plavix.      Acute hypoxic respiratory failure:    Still with labile O2 demand with COVID and multifocal pneumonia.  Per primary team.    We will sign off.  Please call with further questions.     For questions or updates, please contact [facility] Please consult [email or web address] for contact info under Cardiology/STEMI.   Signed, Rollene Rotunda, MD  12/03/2021, 10:12 AM

## 2021-12-04 DIAGNOSIS — J1282 Pneumonia due to coronavirus disease 2019: Secondary | ICD-10-CM | POA: Diagnosis not present

## 2021-12-04 DIAGNOSIS — U071 COVID-19: Secondary | ICD-10-CM | POA: Diagnosis not present

## 2021-12-04 LAB — COMPREHENSIVE METABOLIC PANEL
ALT: 15 U/L (ref 0–44)
AST: 15 U/L (ref 15–41)
Albumin: 2.3 g/dL — ABNORMAL LOW (ref 3.5–5.0)
Alkaline Phosphatase: 64 U/L (ref 38–126)
Anion gap: 10 (ref 5–15)
BUN: 17 mg/dL (ref 8–23)
CO2: 27 mmol/L (ref 22–32)
Calcium: 7.8 mg/dL — ABNORMAL LOW (ref 8.9–10.3)
Chloride: 98 mmol/L (ref 98–111)
Creatinine, Ser: 0.72 mg/dL (ref 0.44–1.00)
GFR, Estimated: >60 mL/min (ref >60)
Glucose, Bld: 120 mg/dL — ABNORMAL HIGH (ref 70–99)
Potassium: 3.9 mmol/L (ref 3.5–5.1)
Sodium: 135 mmol/L (ref 135–145)
Total Bilirubin: 0.6 mg/dL (ref 0.3–1.2)
Total Protein: 5.8 g/dL — ABNORMAL LOW (ref 6.5–8.1)

## 2021-12-04 LAB — BRAIN NATRIURETIC PEPTIDE: B Natriuretic Peptide: 62.3 pg/mL (ref 0.0–100.0)

## 2021-12-04 LAB — GLUCOSE, CAPILLARY
Glucose-Capillary: 117 mg/dL — ABNORMAL HIGH (ref 70–99)
Glucose-Capillary: 65 mg/dL — ABNORMAL LOW (ref 70–99)
Glucose-Capillary: 85 mg/dL (ref 70–99)
Glucose-Capillary: 164 mg/dL — ABNORMAL HIGH (ref 70–99)
Glucose-Capillary: 249 mg/dL — ABNORMAL HIGH (ref 70–99)
Glucose-Capillary: 386 mg/dL — ABNORMAL HIGH (ref 70–99)

## 2021-12-04 LAB — CBC WITH DIFFERENTIAL/PLATELET
Abs Immature Granulocytes: 0 10*3/uL (ref 0.00–0.07)
Basophils Absolute: 0 10*3/uL (ref 0.0–0.1)
Basophils Relative: 0 %
Eosinophils Absolute: 0 10*3/uL (ref 0.0–0.5)
Eosinophils Relative: 0 %
HCT: 37.1 % (ref 36.0–46.0)
Hemoglobin: 12.3 g/dL (ref 12.0–15.0)
Lymphocytes Relative: 10 %
Lymphs Abs: 1.3 10*3/uL (ref 0.7–4.0)
MCH: 30.5 pg (ref 26.0–34.0)
MCHC: 33.2 g/dL (ref 30.0–36.0)
MCV: 92.1 fL (ref 80.0–100.0)
Monocytes Absolute: 0.9 10*3/uL (ref 0.1–1.0)
Monocytes Relative: 7 %
Neutro Abs: 10.7 10*3/uL — ABNORMAL HIGH (ref 1.7–7.7)
Neutrophils Relative %: 83 %
Platelets: 259 10*3/uL (ref 150–400)
RBC: 4.03 MIL/uL (ref 3.87–5.11)
RDW: 13.3 % (ref 11.5–15.5)
WBC: 12.9 10*3/uL — ABNORMAL HIGH (ref 4.0–10.5)
nRBC: 0 % (ref 0.0–0.2)
nRBC: 0 /100 WBC

## 2021-12-04 LAB — PROCALCITONIN: Procalcitonin: 2.58 ng/mL

## 2021-12-04 LAB — D-DIMER, QUANTITATIVE: D-Dimer, Quant: 1.21 ug/mL-FEU — ABNORMAL HIGH (ref 0.00–0.50)

## 2021-12-04 LAB — MAGNESIUM: Magnesium: 2.1 mg/dL (ref 1.7–2.4)

## 2021-12-04 MED ORDER — DEXTROSE 50 % IV SOLN
INTRAVENOUS | Status: AC
  Filled 2021-12-04: qty 50

## 2021-12-04 MED ORDER — METHYLPREDNISOLONE SODIUM SUCC 40 MG IJ SOLR
20.0000 mg | INTRAMUSCULAR | Status: AC
  Administered 2021-12-04 – 2021-12-05 (×2): 20 mg via INTRAVENOUS
  Filled 2021-12-04 (×2): qty 1

## 2021-12-04 MED ORDER — POTASSIUM CHLORIDE CRYS ER 20 MEQ PO TBCR
20.0000 meq | EXTENDED_RELEASE_TABLET | Freq: Once | ORAL | Status: AC
  Administered 2021-12-04: 20 meq via ORAL
  Filled 2021-12-04: qty 1

## 2021-12-04 MED ORDER — FUROSEMIDE 10 MG/ML IJ SOLN
40.0000 mg | Freq: Once | INTRAMUSCULAR | Status: AC
  Administered 2021-12-04: 40 mg via INTRAVENOUS
  Filled 2021-12-04: qty 4

## 2021-12-04 NOTE — Progress Notes (Signed)
PROGRESS NOTE                                                                                                                                                                                                             Patient Demographics:    Alejandra [name], is a 61 y.o. female, DOB - 08/24/1960, SWF:[id number]  Outpatient Primary MD for the patient is Clovis Riley, L.August Saucer, MD    LOS - 7  Admit date - 11/27/2021    Chief Complaint  Patient presents with   Altered Mental Status   Respiratory Distress       Brief Narrative (HPI from H&P)   61 y.o. female with a PMH significant for arthritis, DMII, obesity and OSA non-compliant with CPAP who presented to the ED with SOB.  Reports recently returned from trip with family and majority of people traveling with her tested positive for COVID, she developed symptoms for COVID on 11/26/2021, she became short of breath and confused was brought to the ER and diagnosed with septic shock, AMS, acute hypoxic respiratory failure, COVID and bacterial pneumonia, she was admitted by PCCM.  She has been transferred to my service on 12/02/2021 on day 5 of hospital stay.  She is currently on 9 L of oxygen however looks like she was requiring frequent use of BiPAP during her first 4 days of hospital, has quite a bit of orthopnea still does not feel good.   Subjective:   Patient in bed, appears comfortable, denies any headache, no fever, no chest pain or pressure, proving shortness of breath , no abdominal pain. No focal weakness.   Assessment  & Plan :    Acute hypoxic respiratory failure due to COVID-19 pneumonia along with bacterial pneumonia. She had severe disease with elevated CRP, D-dimer and procalcitonin.  She has completed her remdesivir treatment in ICU, on IV Decadron which I will switch to Solu-Medrol, antibiotics adjusted for possibility of a bacterial component in her infection with elevated  procalcitonin.  She also has evidence of fluid overload and has significant orthopnea hence I will initiate diuresis with IV Lasix, continue she has improved with above intervention.  Encouraged to sit up in chair use I-S and flutter valve for pulmonary toiletry.  Gently advance activity and titrate down oxygen as appropriate.  Improving hypoxia.  Elevated CRP and significantly elevated D-dimer.  Likely due to inflammation from COVID-19 infection, with elevated D-dimer the risk of either having a clot or developing a blood clot is very high,, negative CTA lungs and negative lower extremity venous duplex was negative, V/Q was anterior mediate probability for PE.  D-dimer is coming down after few days of full dose Lovenox which has been titrated to prophylactic dose.  Septic shock.  At the time of admission required Levophed.  Now resolved.  Toxic and metabolic encephalopathy.  Resolved.  Acute on chronic diastolic CHF EF 60%.  Diurese with IV Lasix and monitor.  Mild elevation of troponin in non-ACS pattern likely due to demand ischemia from hypoxia, seen by cardiology, continue aspirin.  No other intervention or work-up recommended by cardiology.  Morbid obesity with OSA.  BMI of 43, follow with PCP for weight loss, noncompliant with CPAP at home, counseled, tolerating BiPAP.  Nightly.  Counseled to use.  Mild intermittent metabolic encephalopathy.  Stable avoid benzodiazepines and narcotics, as needed Haldol if needed.    AKI.  Resolved.  DM type II.  On Lantus and sliding scale.  Monitor and adjust.  Lab Results  Component Value Date   HGBA1C 7.2 (H) 11/27/2021   CBG (last 3)  Recent Labs    12/04/21 0328 12/04/21 0830 12/04/21 0845  GLUCAP 117* 65* 85         Condition -  Guarded  Family Communication  : Daughter Christine bedside on 12/03/2021  Code Status :  Full  Consults  :  PCCM, cardiology  PUD Prophylaxis : PPI   Procedures  :     CTA - No PE  TTE -  1. Left  ventricular ejection fraction, by estimation, is 55 to 60%. The left ventricle has normal function. The left ventricle has no regional wall motion abnormalities. Left ventricular diastolic parameters were normal.  2. Right ventricular systolic function is normal. The right ventricular size is normal. Tricuspid regurgitation signal is inadequate for assessing PA pressure.  3. The mitral valve is normal in structure. Mild to moderate mitral valve regurgitation.  4. The aortic valve was not well visualized. Aortic valve regurgitation is not visualized. No aortic stenosis is present.  5. The inferior vena cava is normal in size with greater than 50% respiratory variability, suggesting right atrial pressure of 3 mmHg.   VQ - Intermidiate probability PE study.  Leg [place] - No DVT      Disposition Plan  :    Status is: Inpatient  DVT Prophylaxis  :  Lovenox  Place TED hose Start: 12/03/21 0904 Place TED hose Start: 12/02/21 0927    Lab Results  Component Value Date   PLT 259 12/04/2021    Diet :  Diet Order             Diet regular Room service appropriate? Yes; Fluid consistency: Thin  Diet effective now                    Inpatient Medications  Scheduled Meds:  aspirin EC  81 mg Oral Daily   Chlorhexidine Gluconate Cloth  6 each Topical Daily   dextromethorphan-guaiFENesin  1 tablet Oral BID   enoxaparin (LOVENOX) injection  50 mg Subcutaneous Q24H   fluconazole  100 mg Oral Daily   fluticasone  2 spray Each Nare BID   insulin aspart  0-15 Units Subcutaneous Q4H   insulin glargine-yfgn  10 Units Subcutaneous BID   levofloxacin  750 mg Oral Daily   linezolid  600 mg Oral Q12H   methylPREDNISolone (SOLU-MEDROL) injection  20 mg Intravenous Q24H   mupirocin ointment   Nasal BID   nystatin   Topical TID   mouth rinse  15 mL Mouth Rinse 4 times per day   oxymetazoline  1 spray Each Nare BID   pantoprazole  40 mg Oral Daily   tamsulosin  0.4 mg Oral Daily   Continuous  Infusions:  sodium chloride     sodium chloride     PRN Meds:.Place/Maintain arterial line **AND** sodium chloride, acetaminophen, ALPRAZolam, HYDROcodone-acetaminophen, ipratropium-albuterol, ondansetron (ZOFRAN) IV, mouth rinse, sodium chloride, white petrolatum  Time Spent in minutes  30   Susa Raring M.D on 12/04/2021 at 10:47 AM  To page go to [email or web address]   [facility] -  Office  [phone]  See all Orders from today for further details    Objective:   Vitals:   12/04/21 0216 12/04/21 0330 12/04/21 0500 12/04/21 0837  BP:  124/74  117/68  Pulse: 77 70  84  Resp: (!) 21 20    Temp:  99.2 F (37.3 C)    TempSrc:  Oral    SpO2: 99% 96%  90%  Weight:   106 kg   Height:        Wt Readings from Last 3 Encounters:  12/04/21 106 kg     Intake/Output Summary (Last 24 hours) at 12/04/2021 1047 Last data filed at 12/04/2021 0300 Gross per 24 hour  Intake --  Output 2900 ml  Net -2900 ml     Physical Exam  Awake Alert, No new F.N deficits, Normal affect [place].AT,PERRAL Supple Neck, No JVD,   Symmetrical Chest wall movement, Good air movement bilaterally, few rales RRR,No Gallops, Rubs or new Murmurs,  +ve B.Sounds, Abd Soft, No tenderness,   1+ edema - TEDs   Data Review:    CBC Recent Labs  Lab 11/29/21 0336 11/29/21 0408 11/30/21 0323 12/01/21 0133 12/02/21 0511 12/03/21 0522 12/04/21 0309  WBC 13.9*  --  17.5* 17.0* 15.7* 13.0* 12.9*  HGB 12.1   < > 11.7* 11.3* 12.2 11.8* 12.3  HCT 34.8*   < > 34.3* 33.6* 37.3 35.8* 37.1  PLT 145*  --  159 166 193 237 259  MCV 88.5  --  89.6 91.8 92.6 91.8 92.1  MCH 30.8  --  30.5 30.9 30.3 30.3 30.5  MCHC 34.8  --  34.1 33.6 32.7 33.0 33.2  RDW 13.7  --  13.7 13.8 13.7 13.7 13.3  LYMPHSABS 0.6*  --  0.7 0.8 1.3  --  1.3  MONOABS 0.2  --  0.2 0.3 0.6  --  0.9  EOSABS 0.0  --  0.0 0.0 0.0  --  0.0  BASOSABS 0.1  --  0.1 0.0 0.0  --  0.0   < > = values in this interval not displayed.     Electrolytes Recent Labs  Lab 11/27/21 1550 11/27/21 1601 11/27/21 1738 11/27/21 1912 11/27/21 1912 11/27/21 2306 11/28/21 0406 11/28/21 1159 11/29/21 0336 11/29/21 0408 11/30/21 0323 12/01/21 0133 12/02/21 0511 12/02/21 0646 12/02/21 0955 12/03/21 0522 12/04/21 0309  NA 137  --    < >  --   --   --  132*  --  131*   < > 137 135 136  --   --  137 135  K 3.7  --    < >  --   --   --  3.8  --  3.6   < >  3.7 3.8 4.0  --   --  4.3 3.9  CL 99  --   --   --   --   --  100  --  98  --  103 102 102  --   --  101 98  CO2 23  --   --   --   --   --  19*  --  23  --  --   --  28 27  GLUCOSE 195*  --   --   --   --   --  310*  --  223*  --  169* 277* 167*  --   --  132* 120*  BUN 12  --   --   --   --   --  20  --  24*  --  --   --  15 17  CREATININE 2.70*  --   --  2.61*  --   --  1.66*  --  0.94  --  0.76 0.72 0.63  --   --  0.68 0.72  CALCIUM 8.2*  --   --   --   --   --  7.2*  --  6.9*  --  7.4* 7.3* 7.4*  --   --  7.8* 7.8*  AST 41  --   --   --   --   --  54*  --  55*  --  34 20 19  --   --   --  15  ALT 24  --   --   --   --   --  26  --  27  --  --   --   --  15  ALKPHOS 67  --   --   --   --   --  50  --  52  --  58 55 78  --   --   --  64  BILITOT 0.7  --   --   --   --   --  0.5  --  0.8  --  0.4 0.6 1.0  --   --   --  0.6  ALBUMIN 3.1*  --   --   --   --   --  2.5*  --  2.3*  --  2.2* 2.3* 2.3*  --   --   --  2.3*  MG  --   --   --   --    < >  --  1.0*  --  1.9  --  2.8* 2.6* 2.3  --   --  2.3 2.1  CRP  --   --   --   --   --   --   --   --   --   --   --   --   --  12.2*  --   --   --   DDIMER 10.40*  --   --   --   --   --   --   --   --   --   --   --   --   --  2.41* 1.58* 1.21*  PROCALCITON  --   --    < > >150.00  --   --  137.50  --  80.08  --   --   --  8.34  --   --  4.53 2.58  LATICACIDVEN 6.2*  --   --  5.2*  --  4.0*  --  3.5*  --   --   --   --   --   --   --   --   --   INR 1.1  --   --   --   --   --   --   --   --   --   --    --   --   --   --   --   --   HGBA1C  --   --   --  7.2*  --   --   --   --   --   --   --   --   --   --   --   --   --   BNP  --  141.4*  --   --   --   --   --   --   --   --   --   --  251.1*  --   --  130.0* 62.3   < > = values in this interval not displayed.   ID Labs Recent Labs  Lab 11/27/21 1550 11/27/21 1550 11/27/21 1912 11/27/21 2306 11/28/21 0406 11/28/21 1159 11/29/21 0336 11/30/21 0323 12/01/21 0133 12/02/21 0511 12/02/21 0646 12/02/21 0955 12/03/21 0522 12/04/21 0309  WBC 6.6  --  10.4  --  15.8*  --  13.9* 17.5* 17.0* 15.7*  --   --  13.0* 12.9*  PLT 172  --  153  --  150  --  145* 159 166 193  --   --  237 259  CRP  --   --   --   --   --   --   --   --   --   --  12.2*  --   --   --   DDIMER 10.40*  --   --   --   --   --   --   --   --   --   --  2.41* 1.58* 1.21*  PROCALCITON  --    < > >150.00  --  137.50  --  80.08  --   --  8.34  --   --  4.53 2.58  LATICACIDVEN 6.2*  --  5.2* 4.0*  --  3.5*  --   --   --   --   --   --   --   --   CREATININE 2.70*  --  2.61*  --  1.66*  --  0.94 0.76 0.72 0.63  --   --  0.68 0.72   < > = values in this interval not displayed.    Radiology Reports CT Angio Chest Pulmonary Embolism (PE) W or WO Contrast  Result Date: 12/03/2021 CLINICAL DATA:  Shortness of breath. EXAM: CT ANGIOGRAPHY CHEST WITH CONTRAST TECHNIQUE: Multidetector CT imaging of the chest was performed using the standard protocol during bolus administration of intravenous contrast. Multiplanar CT image reconstructions and MIPs were obtained to evaluate the vascular anatomy. RADIATION DOSE REDUCTION: This exam was performed according to the departmental dose-optimization program which includes automated exposure control, adjustment of the mA and/or kV according to patient size and/or use of iterative reconstruction technique. CONTRAST:  OMNIPAQUE IOHEXOL 350 MG/ML SOLN COMPARISON:  Radiograph of same day. FINDINGS: Cardiovascular: Satisfactory opacification  of the pulmonary arteries to the  segmental level. No evidence of pulmonary embolism. Normal heart size. No pericardial effusion. Mediastinum/Nodes: No enlarged mediastinal, hilar, or axillary lymph nodes. Thyroid gland, trachea, and esophagus demonstrate no significant findings. Lungs/Pleura: No pneumothorax or pleural effusion is noted. Multiple patchy opacities are noted throughout both lungs consistent with multifocal pneumonia. Upper Abdomen: No acute abnormality. Musculoskeletal: No chest wall abnormality. No acute or significant osseous findings. Review of the MIP images confirms the above findings. IMPRESSION: No definite evidence of pulmonary embolus. Findings consistent with multifocal pneumonia. Electronically Signed   By: Lupita Raider M.D.   On: 12/03/2021 12:46   DG Chest Port 1 View  Result Date: 12/03/2021 CLINICAL DATA:  Shortness of breath EXAM: PORTABLE CHEST 1 VIEW COMPARISON:  Yesterday FINDINGS: Patchy bilateral pneumonia which is unchanged. Cardiomegaly that is stable. No visible effusion or pneumothorax. IMPRESSION: Unchanged multifocal pneumonia. Electronically Signed   By: Tiburcio Pea M.D.   On: 12/03/2021 06:59   DG Chest Port 1 View  Result Date: 12/02/2021 CLINICAL DATA:  COVID EXAM: PORTABLE CHEST 1 VIEW COMPARISON:  11/28/2021 FINDINGS: Unchanged patchy bilateral airspace disease. Cardiomegaly. No visible effusion or pneumothorax. IMPRESSION: Unchanged multifocal pneumonia. Electronically Signed   By: Tiburcio Pea M.D.   On: 12/02/2021 06:32

## 2021-12-04 NOTE — Progress Notes (Signed)
Pt says she does not want to wear her BIPAP tonight. RN will call RT if pt decides she wants to wear her BIPAP while she rests.

## 2021-12-04 NOTE — Progress Notes (Signed)
Hypoglycemic Event  CBG: 65  Treatment: 4 oz juice/soda  Symptoms: None  Follow-up CBG: [id number] CBG Result:75  Possible Reasons for Event: Inadequate meal intake  Comments/MD notified: Dr. Thedore Mins notified diet order changed     Raeford Brandenburg, [place] Ferguson

## 2021-12-05 ENCOUNTER — Inpatient Hospital Stay (HOSPITAL_COMMUNITY): Payer: 59

## 2021-12-05 DIAGNOSIS — J1282 Pneumonia due to coronavirus disease 2019: Secondary | ICD-10-CM | POA: Diagnosis not present

## 2021-12-05 DIAGNOSIS — U071 COVID-19: Secondary | ICD-10-CM | POA: Diagnosis not present

## 2021-12-05 LAB — GLUCOSE, CAPILLARY
Glucose-Capillary: 135 mg/dL — ABNORMAL HIGH (ref 70–99)
Glucose-Capillary: 159 mg/dL — ABNORMAL HIGH (ref 70–99)
Glucose-Capillary: 187 mg/dL — ABNORMAL HIGH (ref 70–99)
Glucose-Capillary: 366 mg/dL — ABNORMAL HIGH (ref 70–99)
Glucose-Capillary: 390 mg/dL — ABNORMAL HIGH (ref 70–99)
Glucose-Capillary: 63 mg/dL — ABNORMAL LOW (ref 70–99)
Glucose-Capillary: 93 mg/dL (ref 70–99)

## 2021-12-05 LAB — COMPREHENSIVE METABOLIC PANEL
ALT: 16 U/L (ref 0–44)
AST: 20 U/L (ref 15–41)
Albumin: 2.4 g/dL — ABNORMAL LOW (ref 3.5–5.0)
Alkaline Phosphatase: 57 U/L (ref 38–126)
Anion gap: 8 (ref 5–15)
BUN: 14 mg/dL (ref 8–23)
CO2: 28 mmol/L (ref 22–32)
Calcium: 7.9 mg/dL — ABNORMAL LOW (ref 8.9–10.3)
Chloride: 100 mmol/L (ref 98–111)
Creatinine, Ser: 0.74 mg/dL (ref 0.44–1.00)
GFR, Estimated: >60 mL/min (ref >60)
Glucose, Bld: 102 mg/dL — ABNORMAL HIGH (ref 70–99)
Potassium: 4 mmol/L (ref 3.5–5.1)
Sodium: 136 mmol/L (ref 135–145)
Total Bilirubin: 0.6 mg/dL (ref 0.3–1.2)
Total Protein: 6.1 g/dL — ABNORMAL LOW (ref 6.5–8.1)

## 2021-12-05 LAB — CBC WITH DIFFERENTIAL/PLATELET
Abs Immature Granulocytes: 0 10*3/uL (ref 0.00–0.07)
Basophils Absolute: 0 10*3/uL (ref 0.0–0.1)
Basophils Relative: 0 %
Eosinophils Absolute: 0 10*3/uL (ref 0.0–0.5)
Eosinophils Relative: 0 %
HCT: 38.9 % (ref 36.0–46.0)
Hemoglobin: 12.8 g/dL (ref 12.0–15.0)
Lymphocytes Relative: 13 %
Lymphs Abs: 1.9 10*3/uL (ref 0.7–4.0)
MCH: 30.8 pg (ref 26.0–34.0)
MCHC: 32.9 g/dL (ref 30.0–36.0)
MCV: 93.5 fL (ref 80.0–100.0)
Monocytes Absolute: 0.4 10*3/uL (ref 0.1–1.0)
Monocytes Relative: 3 %
Neutro Abs: 12.3 10*3/uL — ABNORMAL HIGH (ref 1.7–7.7)
Neutrophils Relative %: 84 %
Platelets: 239 10*3/uL (ref 150–400)
RBC: 4.16 MIL/uL (ref 3.87–5.11)
RDW: 13.4 % (ref 11.5–15.5)
WBC: 14.6 10*3/uL — ABNORMAL HIGH (ref 4.0–10.5)
nRBC: 0 % (ref 0.0–0.2)
nRBC: 0 /100 WBC

## 2021-12-05 LAB — D-DIMER, QUANTITATIVE: D-Dimer, Quant: 1.06 ug/mL-FEU — ABNORMAL HIGH (ref 0.00–0.50)

## 2021-12-05 LAB — PROCALCITONIN: Procalcitonin: 1.54 ng/mL

## 2021-12-05 LAB — MAGNESIUM: Magnesium: 2 mg/dL (ref 1.7–2.4)

## 2021-12-05 MED ORDER — FUROSEMIDE 40 MG PO TABS
40.0000 mg | ORAL_TABLET | Freq: Once | ORAL | Status: AC
  Administered 2021-12-05: 40 mg via ORAL
  Filled 2021-12-05: qty 1

## 2021-12-05 MED ORDER — INSULIN ASPART 100 UNIT/ML IJ SOLN
0.0000 [IU] | Freq: Every day | INTRAMUSCULAR | Status: DC
  Administered 2021-12-05: 5 [IU] via SUBCUTANEOUS

## 2021-12-05 MED ORDER — INSULIN ASPART 100 UNIT/ML IJ SOLN
0.0000 [IU] | Freq: Three times a day (TID) | INTRAMUSCULAR | Status: DC
  Administered 2021-12-05: 2 [IU] via SUBCUTANEOUS
  Administered 2021-12-05: 9 [IU] via SUBCUTANEOUS

## 2021-12-05 MED ORDER — INSULIN GLARGINE-YFGN 100 UNIT/ML ~~LOC~~ SOLN
8.0000 [IU] | Freq: Every day | SUBCUTANEOUS | Status: DC
  Administered 2021-12-06: 8 [IU] via SUBCUTANEOUS
  Filled 2021-12-05: qty 0.08

## 2021-12-05 MED ORDER — INSULIN GLARGINE-YFGN 100 UNIT/ML ~~LOC~~ SOLN
8.0000 [IU] | Freq: Two times a day (BID) | SUBCUTANEOUS | Status: DC
  Administered 2021-12-05: 8 [IU] via SUBCUTANEOUS
  Filled 2021-12-05 (×2): qty 0.08

## 2021-12-05 MED ORDER — HYDROCODONE-ACETAMINOPHEN 5-325 MG PO TABS
1.0000 | ORAL_TABLET | Freq: Once | ORAL | Status: AC
  Administered 2021-12-05: 1 via ORAL
  Filled 2021-12-05: qty 1

## 2021-12-05 NOTE — Progress Notes (Signed)
PROGRESS NOTE                                                                                                                                                                                                             Patient Demographics:    Alejandra [name], is a 61 y.o. female, DOB - 19-Dec-1960, RSW:[id number]  Outpatient Primary MD for the patient is Clovis Riley, L.August Saucer, MD    LOS - 8  Admit date - 11/27/2021    Chief Complaint  Patient presents with   Altered Mental Status   Respiratory Distress       Brief Narrative (HPI from H&P)   61 y.o. female with a PMH significant for arthritis, DMII, obesity and OSA non-compliant with CPAP who presented to the ED with SOB.  Reports recently returned from trip with family and majority of people traveling with her tested positive for COVID, she developed symptoms for COVID on 11/26/2021, she became short of breath and confused was brought to the ER and diagnosed with septic shock, AMS, acute hypoxic respiratory failure, COVID and bacterial pneumonia, she was admitted by PCCM.  She has been transferred to my service on 12/02/2021 on day 5 of hospital stay.  She is currently on 9 L of oxygen however looks like she was requiring frequent use of BiPAP during her first 4 days of hospital, has quite a bit of orthopnea still does not feel good.   Subjective:   Patient in bed, appears comfortable, denies any headache, no fever, no chest pain or pressure, improved shortness of breath , no abdominal pain. No new focal weakness.    Assessment  & Plan :    Acute hypoxic respiratory failure due to COVID-19 pneumonia along with bacterial pneumonia. She had severe disease with elevated CRP, D-dimer and procalcitonin.  She has completed her remdesivir treatment in ICU, on IV Decadron which I will switch to Solu-Medrol, antibiotics adjusted for possibility of a bacterial component in her infection with  elevated procalcitonin.  She also has evidence of fluid overload and has significant orthopnea hence continue diuresis with Lasix, continue she has improved with above intervention.  Encouraged to sit up in chair use I-S and flutter valve for pulmonary toiletry.  Gently advance activity and titrate down oxygen as appropriate.  Improving hypoxia now on 4L down from 10L.  Elevated  CRP and significantly elevated D-dimer.  Likely due to inflammation from COVID-19 infection, with elevated D-dimer the risk of either having a clot or developing a blood clot is very high,, negative CTA lungs and negative lower extremity venous duplex was negative, V/Q was anterior mediate probability for PE.  D-dimer is coming down after few days of full dose Lovenox which has been titrated to prophylactic dose.  Septic shock.  At the time of admission required Levophed.  Now resolved.  Toxic and metabolic encephalopathy.  Resolved.  Acute on chronic diastolic CHF EF 60%.  Diurese with IV Lasix and monitor.  Mild elevation of troponin in non-ACS pattern likely due to demand ischemia from hypoxia, seen by cardiology, continue aspirin.  No other intervention or work-up recommended by cardiology.  Morbid obesity with OSA.  BMI of 43, follow with PCP for weight loss, noncompliant with CPAP at home, counseled, tolerating BiPAP.  Nightly.  Counseled to use.  Mild intermittent metabolic encephalopathy.  Stable avoid benzodiazepines and narcotics, as needed Haldol if needed.    AKI.  Resolved.  DM type II.  On Lantus and sliding scale.  Monitor and adjust.  Lab Results  Component Value Date   HGBA1C 7.2 (H) 11/27/2021   CBG (last 3)  Recent Labs    12/05/21 0400 12/05/21 0452 12/05/21 0812  GLUCAP 63* 135* 93         Condition -  Guarded  Family Communication  : Daughter Christine bedside on 12/03/2021, 12/05/21  Code Status :  Full  Consults  :  PCCM, cardiology  PUD Prophylaxis : PPI   Procedures  :      CTA - No PE  TTE -  1. Left ventricular ejection fraction, by estimation, is 55 to 60%. The left ventricle has normal function. The left ventricle has no regional wall motion abnormalities. Left ventricular diastolic parameters were normal.  2. Right ventricular systolic function is normal. The right ventricular size is normal. Tricuspid regurgitation signal is inadequate for assessing PA pressure.  3. The mitral valve is normal in structure. Mild to moderate mitral valve regurgitation.  4. The aortic valve was not well visualized. Aortic valve regurgitation is not visualized. No aortic stenosis is present.  5. The inferior vena cava is normal in size with greater than 50% respiratory variability, suggesting right atrial pressure of 3 mmHg.   VQ - Intermidiate probability PE study.  Leg [place] - No DVT      Disposition Plan  :    Status is: Inpatient  DVT Prophylaxis  :  Lovenox  Place TED hose Start: 12/03/21 0904 Place TED hose Start: 12/02/21 0927    Lab Results  Component Value Date   PLT 239 12/05/2021    Diet :  Diet Order             Diet regular Room service appropriate? Yes; Fluid consistency: Thin; Fluid restriction: 1500 mL Fluid  Diet effective now                    Inpatient Medications  Scheduled Meds:  aspirin EC  81 mg Oral Daily   Chlorhexidine Gluconate Cloth  6 each Topical Daily   dextromethorphan-guaiFENesin  1 tablet Oral BID   enoxaparin (LOVENOX) injection  50 mg Subcutaneous Q24H   fluticasone  2 spray Each Nare BID   furosemide  40 mg Oral Once   insulin aspart  0-5 Units Subcutaneous QHS   insulin aspart  0-9 Units Subcutaneous  TID WC   [START ON 12/06/2021] insulin glargine-yfgn  8 Units Subcutaneous Daily   levofloxacin  750 mg Oral Daily   mupirocin ointment   Nasal BID   nystatin   Topical TID   mouth rinse  15 mL Mouth Rinse 4 times per day   oxymetazoline  1 spray Each Nare BID   pantoprazole  40 mg Oral Daily   tamsulosin  0.4  mg Oral Daily   Continuous Infusions:  sodium chloride     sodium chloride     PRN Meds:.Place/Maintain arterial line **AND** sodium chloride, acetaminophen, ALPRAZolam, HYDROcodone-acetaminophen, ipratropium-albuterol, ondansetron (ZOFRAN) IV, mouth rinse, sodium chloride, white petrolatum  Time Spent in minutes  30   Susa Raring M.D on 12/05/2021 at 11:12 AM  To page go to [email or web address]   [facility] -  Office  [phone]  See all Orders from today for further details    Objective:   Vitals:   12/04/21 2000 12/05/21 0003 12/05/21 0405 12/05/21 0630  BP: 125/67 124/68 (!) 134/59   Pulse: 90 68 69 64  Resp: 18 20 15 20   Temp: 98.3 F (36.8 C) 97.6 F (36.4 C) 97.6 F (36.4 C)   TempSrc: Oral Oral Oral   SpO2: 94% 95% 92% 97%  Weight:      Height:        Wt Readings from Last 3 Encounters:  12/04/21 106 kg     Intake/Output Summary (Last 24 hours) at 12/05/2021 1112 Last data filed at 12/05/2021 0600 Gross per 24 hour  Intake 0 ml  Output 1150 ml  Net -1150 ml     Physical Exam  Awake Alert, No new F.N deficits, Normal affect [place].AT,PERRAL Supple Neck, No JVD,   Symmetrical Chest wall movement, Good air movement bilaterally, minimal rales RRR,No Gallops, Rubs or new Murmurs,  +ve B.Sounds, Abd Soft, No tenderness,   trace edema - TEDs   Data Review:    CBC Recent Labs  Lab 11/30/21 0323 12/01/21 0133 12/02/21 0511 12/03/21 0522 12/04/21 0309 12/05/21 0802  WBC 17.5* 17.0* 15.7* 13.0* 12.9* 14.6*  HGB 11.7* 11.3* 12.2 11.8* 12.3 12.8  HCT 34.3* 33.6* 37.3 35.8* 37.1 38.9  PLT 159 166 193 237 259 239  MCV 89.6 91.8 92.6 91.8 92.1 93.5  MCH 30.5 30.9 30.3 30.3 30.5 30.8  MCHC 34.1 33.6 32.7 33.0 33.2 32.9  RDW 13.7 13.8 13.7 13.7 13.3 13.4  LYMPHSABS 0.7 0.8 1.3  --  1.3 1.9  MONOABS 0.2 0.3 0.6  --  0.9 0.4  EOSABS 0.0 0.0 0.0  --  0.0 0.0  BASOSABS 0.1 0.0 0.0  --  0.0 0.0    Electrolytes Recent Labs  Lab  0000  11/28/21 1159 11/29/21 0336 11/29/21 0408 11/30/21 0323 12/01/21 0133 12/02/21 0511 12/02/21 0646 12/02/21 0955 12/03/21 0522 12/04/21 0309 12/05/21 0802  NA  --   --  131*   < > 137 135 136  --   --  137 135 136  K  --   --  3.6   < > 3.7 3.8 4.0  --   --  4.3 3.9 4.0  CL   < >  --  98  --  103 102 102  --   --  101 98 100  CO2   < >  --  23  --  26 24 26   --   --  28 27 28   GLUCOSE   < >  --  223*  --  169* 277* 167*  --   --  132* 120* 102*  BUN   < >  --  24*  --  17 16 14   --   --  15 17 14   CREATININE   < >  --  0.94  --  0.76 0.72 0.63  --   --  0.68 0.72 0.74  CALCIUM   < >  --  6.9*  --  7.4* 7.3* 7.4*  --   --  7.8* 7.8* 7.9*  AST   < >  --  55*  --  34 20 19  --   --   --  15 20  ALT   < >  --  27  --  24 22 20   --   --   --  15 16  ALKPHOS   < >  --  52  --  58 55 78  --   --   --  64 57  BILITOT   < >  --  0.8  --  0.4 0.6 1.0  --   --   --  0.6 0.6  ALBUMIN   < >  --  2.3*  --  2.2* 2.3* 2.3*  --   --   --  2.3* 2.4*  MG   < >  --  1.9  --  2.8* 2.6* 2.3  --   --  2.3 2.1 2.0  CRP  --   --   --   --   --   --   --  12.2*  --   --   --   --   DDIMER  --   --   --   --   --   --   --   --  2.41* 1.58* 1.21* 1.06*  PROCALCITON  --   --  80.08  --   --   --  8.34  --   --  4.53 2.58  --   LATICACIDVEN  --  3.5*  --   --   --   --   --   --   --   --   --   --   BNP  --   --   --   --   --   --  251.1*  --   --  130.0* 62.3  --    < > = values in this interval not displayed.   ID Labs Recent Labs  Lab 11/28/21 1159 11/29/21 0336 11/30/21 0323 12/01/21 0133 12/02/21 0511 12/02/21 01/31/22 12/02/21 0955 12/03/21 0522 12/04/21 0309 12/05/21 0802  WBC  --  13.9*   < > 17.0* 15.7*  --   --  13.0* 12.9* 14.6*  PLT  --  145*   < > 166 193  --   --  237 259 239  CRP  --   --   --   --   --  12.2*  --   --   --   --   DDIMER  --   --   --   --   --   --  2.41* 1.58* 1.21* 1.06*  PROCALCITON  --  80.08  --   --  8.34  --   --  4.53 2.58  --   LATICACIDVEN 3.5*  --   --    --   --   --   --   --   --   --  CREATININE  --  0.94   < > 0.72 0.63  --   --  0.68 0.72 0.74   < > = values in this interval not displayed.    Radiology Reports CT Angio Chest Pulmonary Embolism (PE) W or WO Contrast  Result Date: 12/03/2021 CLINICAL DATA:  Shortness of breath. EXAM: CT ANGIOGRAPHY CHEST WITH CONTRAST TECHNIQUE: Multidetector CT imaging of the chest was performed using the standard protocol during bolus administration of intravenous contrast. Multiplanar CT image reconstructions and MIPs were obtained to evaluate the vascular anatomy. RADIATION DOSE REDUCTION: This exam was performed according to the departmental dose-optimization program which includes automated exposure control, adjustment of the mA and/or kV according to patient size and/or use of iterative reconstruction technique. CONTRAST:  100mL OMNIPAQUE IOHEXOL 350 MG/ML SOLN COMPARISON:  Radiograph of same day. FINDINGS: Cardiovascular: Satisfactory opacification of the pulmonary arteries to the segmental level. No evidence of pulmonary embolism. Normal heart size. No pericardial effusion. Mediastinum/Nodes: No enlarged mediastinal, hilar, or axillary lymph nodes. Thyroid gland, trachea, and esophagus demonstrate no significant findings. Lungs/Pleura: No pneumothorax or pleural effusion is noted. Multiple patchy opacities are noted throughout both lungs consistent with multifocal pneumonia. Upper Abdomen: No acute abnormality. Musculoskeletal: No chest wall abnormality. No acute or significant osseous findings. Review of the MIP images confirms the above findings. IMPRESSION: No definite evidence of pulmonary embolus. Findings consistent with multifocal pneumonia. Electronically Signed   By: Lupita RaiderJames  Green Jr M.D.   On: 12/03/2021 12:46   DG Chest Port 1 View  Result Date: 12/03/2021 CLINICAL DATA:  Shortness of breath EXAM: PORTABLE CHEST 1 VIEW COMPARISON:  Yesterday FINDINGS: Patchy bilateral pneumonia which is unchanged.  Cardiomegaly that is stable. No visible effusion or pneumothorax. IMPRESSION: Unchanged multifocal pneumonia. Electronically Signed   By: Tiburcio PeaJonathan  Watts M.D.   On: 12/03/2021 06:59   DG Chest Port 1 View  Result Date: 12/02/2021 CLINICAL DATA:  COVID EXAM: PORTABLE CHEST 1 VIEW COMPARISON:  11/28/2021 FINDINGS: Unchanged patchy bilateral airspace disease. Cardiomegaly. No visible effusion or pneumothorax. IMPRESSION: Unchanged multifocal pneumonia. Electronically Signed   By: Tiburcio PeaJonathan  Watts M.D.   On: 12/02/2021 06:32

## 2021-12-06 ENCOUNTER — Other Ambulatory Visit (HOSPITAL_COMMUNITY): Payer: Self-pay

## 2021-12-06 DIAGNOSIS — J1282 Pneumonia due to coronavirus disease 2019: Secondary | ICD-10-CM | POA: Diagnosis not present

## 2021-12-06 DIAGNOSIS — U071 COVID-19: Secondary | ICD-10-CM | POA: Diagnosis not present

## 2021-12-06 LAB — COMPREHENSIVE METABOLIC PANEL
ALT: 20 U/L (ref 0–44)
AST: 23 U/L (ref 15–41)
Albumin: 2.5 g/dL — ABNORMAL LOW (ref 3.5–5.0)
Alkaline Phosphatase: 62 U/L (ref 38–126)
Anion gap: 9 (ref 5–15)
BUN: 13 mg/dL (ref 8–23)
CO2: 29 mmol/L (ref 22–32)
Calcium: 8.2 mg/dL — ABNORMAL LOW (ref 8.9–10.3)
Chloride: 97 mmol/L — ABNORMAL LOW (ref 98–111)
Creatinine, Ser: 0.69 mg/dL (ref 0.44–1.00)
GFR, Estimated: >60 mL/min (ref >60)
Glucose, Bld: 110 mg/dL — ABNORMAL HIGH (ref 70–99)
Potassium: 3.9 mmol/L (ref 3.5–5.1)
Sodium: 135 mmol/L (ref 135–145)
Total Bilirubin: 0.7 mg/dL (ref 0.3–1.2)
Total Protein: 6.3 g/dL — ABNORMAL LOW (ref 6.5–8.1)

## 2021-12-06 LAB — CBC WITH DIFFERENTIAL/PLATELET
Abs Immature Granulocytes: 0.3 10*3/uL — ABNORMAL HIGH (ref 0.00–0.07)
Basophils Absolute: 0 10*3/uL (ref 0.0–0.1)
Basophils Relative: 0 %
Eosinophils Absolute: 0 10*3/uL (ref 0.0–0.5)
Eosinophils Relative: 0 %
HCT: 41 % (ref 36.0–46.0)
Hemoglobin: 13.7 g/dL (ref 12.0–15.0)
Lymphocytes Relative: 18 %
Lymphs Abs: 2.6 10*3/uL (ref 0.7–4.0)
MCH: 30.9 pg (ref 26.0–34.0)
MCHC: 33.4 g/dL (ref 30.0–36.0)
MCV: 92.6 fL (ref 80.0–100.0)
Monocytes Absolute: 0.1 10*3/uL (ref 0.1–1.0)
Monocytes Relative: 1 %
Myelocytes: 2 %
Neutro Abs: 11.6 10*3/uL — ABNORMAL HIGH (ref 1.7–7.7)
Neutrophils Relative %: 79 %
Platelets: 233 10*3/uL (ref 150–400)
RBC: 4.43 MIL/uL (ref 3.87–5.11)
RDW: 13.3 % (ref 11.5–15.5)
WBC: 14.7 10*3/uL — ABNORMAL HIGH (ref 4.0–10.5)
nRBC: 0 % (ref 0.0–0.2)
nRBC: 0 /100 WBC

## 2021-12-06 LAB — GLUCOSE, CAPILLARY
Glucose-Capillary: 81 mg/dL (ref 70–99)
Glucose-Capillary: 113 mg/dL — ABNORMAL HIGH (ref 70–99)

## 2021-12-06 LAB — D-DIMER, QUANTITATIVE: D-Dimer, Quant: 4.51 ug/mL-FEU — ABNORMAL HIGH (ref 0.00–0.50)

## 2021-12-06 LAB — MAGNESIUM: Magnesium: 1.9 mg/dL (ref 1.7–2.4)

## 2021-12-06 LAB — PROCALCITONIN: Procalcitonin: 0.86 ng/mL

## 2021-12-06 MED ORDER — OXYMETAZOLINE HCL 0.05 % NA SOLN
1.0000 | Freq: Two times a day (BID) | NASAL | 0 refills | Status: AC
  Filled 2021-12-06: qty 30, 150d supply, fill #0

## 2021-12-06 MED ORDER — PANTOPRAZOLE SODIUM 40 MG PO TBEC
40.0000 mg | DELAYED_RELEASE_TABLET | Freq: Every day | ORAL | 0 refills | Status: DC
  Filled 2021-12-06: qty 30, 30d supply, fill #0

## 2021-12-06 MED ORDER — OXYMETAZOLINE HCL 0.05 % NA SOLN
1.0000 | Freq: Two times a day (BID) | NASAL | Status: DC
Start: 2021-12-06 — End: 2021-12-06
  Administered 2021-12-06: 1 via NASAL
  Filled 2021-12-06: qty 30

## 2021-12-06 MED ORDER — ASPIRIN 81 MG PO TBEC
81.0000 mg | DELAYED_RELEASE_TABLET | Freq: Every day | ORAL | 0 refills | Status: AC
  Filled 2021-12-06: qty 30, 30d supply, fill #0

## 2021-12-06 MED ORDER — FUROSEMIDE 40 MG PO TABS
60.0000 mg | ORAL_TABLET | Freq: Once | ORAL | Status: AC
  Administered 2021-12-06: 60 mg via ORAL
  Filled 2021-12-06: qty 1

## 2021-12-06 MED ORDER — POTASSIUM CHLORIDE CRYS ER 20 MEQ PO TBCR
20.0000 meq | EXTENDED_RELEASE_TABLET | Freq: Once | ORAL | Status: AC
  Administered 2021-12-06: 20 meq via ORAL
  Filled 2021-12-06: qty 1

## 2021-12-06 MED ORDER — ALBUTEROL SULFATE HFA 108 (90 BASE) MCG/ACT IN AERS
2.0000 | INHALATION_SPRAY | Freq: Four times a day (QID) | RESPIRATORY_TRACT | 0 refills | Status: DC | PRN
  Filled 2021-12-06: qty 8.5, 25d supply, fill #0

## 2021-12-06 MED ORDER — DM-GUAIFENESIN ER 30-600 MG PO TB12
1.0000 | ORAL_TABLET | Freq: Two times a day (BID) | ORAL | 0 refills | Status: DC | PRN
  Filled 2021-12-06: qty 30, 15d supply, fill #0

## 2021-12-06 NOTE — Progress Notes (Signed)
VAST consulted to obtain labs. Patient with 2 working IV's. Patient requested IV team stick her for labs after phlebotomy was unsuccessful. Educated nurse and patient that VAST does not place IV's for lab draw. Explained further if blood could be drawn from one of her current IV's we could collect blood this time in that manner, but since she has 2 working IV's we will not be sticking her. Blood collected from PIV in left forearm. Light green and lavender tubes sent to lab via tube system.

## 2021-12-06 NOTE — Progress Notes (Signed)
SATURATION QUALIFICATIONS: (This note is used to comply with regulatory documentation for home oxygen)  Patient Saturations on Room Air at Rest = 83%  Patient Saturations on Room Air while Ambulating = 85%  Patient Saturations on 2 Liters of oxygen while Ambulating = 86%  Patient Saturations on 4 Liters of oxygen while Ambulating = 89%  Please briefly explain why patient needs home oxygen:

## 2021-12-06 NOTE — TOC Transition Note (Addendum)
Transition of Care [facility]) - CM/SW Discharge Note   Patient Details  Name: Alejandra [name] MRN: [id number] Date of Birth: 1960/07/18  Transition of [facility]) CM/SW Contact:  Harriet Masson, RN Phone Number: 12/06/2021, 12:08 PM   Clinical Narrative:    Patient stable for discharge. Home health and home oxygen ordered. Patient deferred to [facility] to find highly rated agency for home health. Spoke to Amy with [facility] and referral accepted. [employer] accepted home oxygen order as charity due to no DME agencies accepting patient's insurance at this time.Patient's husband will transport home. Patient declines walker. Address, Phone number and PCP verified.   Final next level of care: Home w Home Health Services Barriers to Discharge: Barriers Resolved   Patient Goals and CMS Choice Patient states their goals for this hospitalization and ongoing recovery are:: return home CMS Medicare.gov Compare Post Acute Care list provided to:: Patient Choice offered to / list presented to : Patient  Discharge Placement               home        Discharge Plan and Services   Discharge Planning Services: CM Consult            DME Arranged: Oxygen DME Agency: [facility] Date DME Agency Contacted: 12/06/21 Time DME Agency Contacted: 1203 Representative spoke with at DME Agency: Deretha Emory HH Arranged: RN, PT HH Agency: [facility] Date [facility] Agency Contacted: 12/06/21 Time HH Agency Contacted: 1203 Representative spoke with at [facility] Agency: Amy  Social Determinants of Health (SDOH) Interventions     Readmission Risk Interventions    12/06/2021   12:08 PM  Readmission Risk Prevention Plan  Post Dischage Appt Complete  Medication Screening Complete  Transportation Screening Complete

## 2021-12-06 NOTE — Progress Notes (Addendum)
Patient discharge teaching given, including activity, diet, follow-up appoints, and medications. Patient verbalized understanding of all discharge instructions. IV access was d/c'd. Vitals are stable. Skin is intact except as charted in most recent assessments. Pt to be escorted out by NT, to be driven home by family.  Oxygen delivered and med from pharmacy ordered. Patient has voided since foley removal.

## 2021-12-06 NOTE — Discharge Summary (Signed)
SENG FOUTS KLK:[id number] DOB: January 02, 1961 DOA: 11/27/2021  PCP: Clovis Riley, L.August Saucer, MD  Admit date: 11/27/2021  Discharge date: 12/06/2021  Admitted From: Home   Disposition:  Home   Recommendations for Outpatient Follow-up:   Follow up with PCP in 1-2 weeks  PCP Please obtain BMP/CBC, 2 view CXR in 1week,  (see Discharge instructions)   PCP Please follow up on the following pending results: Check CBC, BMP, magnesium and a two-view chest x-ray in 7 to 10 days   Home Health: PT, RN if she qualifies Equipment/Devices: As below Consultations: Pulmonary Discharge Condition: Stable    CODE STATUS: Full    Diet Recommendation: Heart Healthy Low Carb     Chief Complaint  Patient presents with   Altered Mental Status   Respiratory Distress     Brief history of present illness from the day of admission and additional interim summary     61 y.o. female with a PMH significant for arthritis, DMII, obesity and OSA non-compliant with CPAP who presented to the ED with SOB.  Reports recently returned from trip with family and majority of people traveling with her tested positive for COVID, she developed symptoms for COVID on 11/26/2021, she became short of breath and confused was brought to the ER and diagnosed with septic shock, AMS, acute hypoxic respiratory failure, COVID and bacterial pneumonia, she was admitted by PCCM.  She has been transferred to my service on 12/02/2021 on day 5 of hospital stay.  She is currently on 9 L of oxygen however looks like she was requiring frequent use of BiPAP during her first 4 days of hospital, has quite a bit of orthopnea still does not feel good.                                                                 Hospital Course   Acute hypoxic respiratory failure due to COVID-19 pneumonia  along with bacterial pneumonia. She had severe disease with elevated CRP, D-dimer and procalcitonin.  She has completed her remdesivir treatment in ICU, was also treated with steroids, antibiotics adjusted for possibility of a bacterial component in her infection with elevated procalcitonin.  She also has evidence of fluid overload and has significant orthopnea hence continue diuresis with Lasix, she was encouraged to sit up in chair use I-S and flutter valve for pulmonary toiletry.  With above care she is much improved from 9 L nasal cannula oxygen she is down to 3 L and currently relatively symptom-free at rest, she has finished her COVID directed treatment and will be discharged home on home oxygen with close outpatient PCP follow-up.  SpO2: 91 % O2 Flow Rate (L/min): 3 L/min FiO2 (%): (S) 60 %   Elevated CRP and significantly elevated D-dimer.  Likely due to inflammation from COVID-19 infection,  with elevated D-dimer the risk of either having a clot or developing a blood clot is very high,, negative CTA lungs and negative lower extremity venous duplex was negative, V/Q was anterior mediate probability for PE.  D-dimer is coming down after few days of full dose Lovenox which has been titrated to prophylactic dose.  Upon discharge we will discharge her on aspirin.   Septic shock.  At the time of admission required Levophed.  Now resolved.   Toxic and metabolic encephalopathy.  Resolved.  Acute on chronic diastolic CHF EF 60%.  Diurese with IV Lasix and monitor.  Mild elevation of troponin in non-ACS pattern likely due to demand ischemia from hypoxia, seen by cardiology, continue aspirin.  No other intervention or work-up recommended by cardiology.  PCP to arrange for close outpatient cardiology follow-up postdischarge.   Morbid obesity with OSA.  BMI of 43, follow with PCP for weight loss, noncompliant with CPAP at home, counseled, tolerating BiPAP.  Nightly.  Counseled to use.   Mild intermittent  metabolic encephalopathy.  Completely resolved.   AKI.  Resolved.   DM type II.  Continue home regimen.   Discharge diagnosis     Principal Problem:   Pneumonia due to COVID-19 virus Active Problems:   Acute kidney injury (HCC)   Acute respiratory failure with hypoxia [facility])    Discharge instructions    Discharge Instructions     Discharge instructions   Complete by: As directed    Follow with Primary MD Clovis Riley, L.August Saucer, MD in 7 days   Get CBC, CMP, Magnesium, 2 view Chest X ray -  checked next visit within 1 week by Primary MD    Activity: As tolerated with Full fall precautions use walker/cane & assistance as needed  Disposition Home   Diet: Heart Healthy Low Carb  Accuchecks 4 times/day, Once in AM empty stomach and then before each meal. Log in all results and show them to your Prim.MD in 3 days. If any glucose reading is under 80 or above 300 call your Prim MD immidiately. Follow Low glucose instructions for glucose under 80 as instructed.   Special Instructions: If you have smoked or chewed Tobacco  in the last 2 yrs please stop smoking, stop any regular Alcohol  and or any Recreational drug use.  On your next visit with your primary care physician please Get Medicines reviewed and adjusted.  Please request your Prim.MD to go over all Hospital Tests and Procedure/Radiological results at the follow up, please get all Hospital records sent to your Prim MD by signing hospital release before you go home.  If you experience worsening of your admission symptoms, develop shortness of breath, life threatening emergency, suicidal or homicidal thoughts you must seek medical attention immediately by calling 911 or calling your MD immediately  if symptoms less severe.  You Must read complete instructions/literature along with all the possible adverse reactions/side effects for all the Medicines you take and that have been prescribed to you. Take any new Medicines after you  have completely understood and accpet all the possible adverse reactions/side effects.   Increase activity slowly   Complete by: As directed    MyChart COVID-19 home monitoring program   Complete by: Dec 06, 2021    Is the patient willing to use the MyChart Mobile App for home monitoring?: Yes   Temperature monitoring   Complete by: Dec 06, 2021    After how many days would you like to receive a notification of this  patient's flowsheet entries?: 1       Discharge Medications   Allergies as of 12/06/2021       Reactions   Atorvastatin Other (See Comments)   SOB   Bactrim [sulfamethoxazole-trimethoprim] Other (See Comments)   Mouth will be like metal   Celebrex [celecoxib] Diarrhea   Codeine Nausea And Vomiting   Dilaudid [hydromorphone Hcl] Nausea And Vomiting   Hydrochlorothiazide Other (See Comments)   CANNOT VOID   Meloxicam Other (See Comments)   FLU LIKE SYMPTOMS   Morphine And Related Nausea And Vomiting   Tramadol    unknown   Wound Dressing Adhesive    Amoxicillin-pot Clavulanate Diarrhea   Other Hives, Rash   emysin   Wellbutrin [bupropion] Swelling, Rash        Medication List     STOP taking these medications    nitrofurantoin 100 MG capsule Commonly known as: MACRODANTIN       TAKE these medications    albuterol 108 (90 Base) MCG/ACT inhaler Commonly known as: VENTOLIN HFA Inhale 2 puffs into the lungs every 6 (six) hours as needed for wheezing or shortness of breath.   aspirin EC 81 MG tablet Take 1 tablet (81 mg total) by mouth daily. Swallow whole. Start taking on: December 07, 2021   cetirizine 10 MG chewable tablet Commonly known as: ZYRTEC Chew 10 mg by mouth daily.   dextromethorphan-guaiFENesin 30-600 MG 12hr tablet Commonly known as: MUCINEX DM Take 1 tablet by mouth 2 (two) times daily as needed for cough.   glimepiride 4 MG tablet Commonly known as: AMARYL Take 4 mg by mouth daily.   losartan 50 MG tablet Commonly known as:  COZAAR   misoprostol 100 MCG tablet Commonly known as: CYTOTEC Take 100 mcg by mouth daily as needed (For Inflammation per spouse).   oxymetazoline 0.05 % nasal spray Commonly known as: AFRIN Place 1 spray into both nostrils 2 (two) times daily.   pantoprazole 40 MG tablet Commonly known as: PROTONIX Take 1 tablet (40 mg total) by mouth daily. Start taking on: December 07, 2021   Trulicity 1.5 MG/0.5ML Sopn Generic drug: Dulaglutide Inject 1.5 mg into the skin daily.   verapamil 120 MG CR tablet Commonly known as: Marine scientist  (From admission, onward)           Start     Ordered   12/06/21 1253  For home use only DME Walker rolling  Once       Comments: 5 wheel  Question Answer Comment  Walker: With 5 Inch Wheels   Patient needs a walker to treat with the following condition Weakness      12/06/21 1252   12/06/21 0942  For home use only DME oxygen  Once       Question Answer Comment  Length of Need 6 Months   Mode or (Route) Nasal cannula   Liters per Minute 4   Frequency Continuous (stationary and portable oxygen unit needed)   Oxygen conserving device Yes   Oxygen delivery system Gas      12/06/21 0941             Follow-up Information     Rollene Rotunda, MD. Schedule an appointment as soon as possible for a visit in 1 week(s).   Specialty: Cardiology Why: 3:00PM. Cardiology follow up Contact information: [address] [place] [address] [phone]  [facility] Follow up.   Why: Home Health RN. PT arranged. They will contact you within 1 to 2 days after discharge to schedule apt Contact information: [address] [place] [address] [phone]         [facility]. Schedule an appointment as soon as possible for a visit.   Why: Call to make apt to get tested for portable oxygen Contact information: [address] [place]  [place] [address] [phone]         Call friday health plan Follow up.   Why: Please call number on bakc of insurance card to determine if you need to find a new plan.        Mitchell, L.August Saucer, MD. Schedule an appointment as soon as possible for a visit in 1 week(s).   Specialty: Family Medicine Contact information: [address] [place] [address] [phone]                 Major procedures and Radiology Reports - PLEASE review detailed and final reports thoroughly  -      DG Chest Port 1 View  Result Date: 12/05/2021 CLINICAL DATA:  [id number]; shortness of breath EXAM: PORTABLE CHEST 1 VIEW COMPARISON:  December 03, 2021 FINDINGS: The cardiomediastinal silhouette is unchanged in contour. No pleural effusion. No pneumothorax. Patchy bilateral airspace opacities, overall similar in comparison to prior. Visualized abdomen is unremarkable. IMPRESSION: Similar appearance of patchy bilateral airspace opacities potentially reflecting multifocal infection. Electronically Signed   By: Meda Klinefelter M.D.   On: 12/05/2021 11:16   CT Angio Chest Pulmonary Embolism (PE) W or WO Contrast  Result Date: 12/03/2021 CLINICAL DATA:  Shortness of breath. EXAM: CT ANGIOGRAPHY CHEST WITH CONTRAST TECHNIQUE: Multidetector CT imaging of the chest was performed using the standard protocol during bolus administration of intravenous contrast. Multiplanar CT image reconstructions and MIPs were obtained to evaluate the vascular anatomy. RADIATION DOSE REDUCTION: This exam was performed according to the departmental dose-optimization program which includes automated exposure control, adjustment of the mA and/or kV according to patient size and/or use of iterative reconstruction technique. CONTRAST:  OMNIPAQUE IOHEXOL 350 MG/ML SOLN COMPARISON:  Radiograph of same day. FINDINGS: Cardiovascular: Satisfactory opacification of the pulmonary arteries to the segmental level. No evidence of  pulmonary embolism. Normal heart size. No pericardial effusion. Mediastinum/Nodes: No enlarged mediastinal, hilar, or axillary lymph nodes. Thyroid gland, trachea, and esophagus demonstrate no significant findings. Lungs/Pleura: No pneumothorax or pleural effusion is noted. Multiple patchy opacities are noted throughout both lungs consistent with multifocal pneumonia. Upper Abdomen: No acute abnormality. Musculoskeletal: No chest wall abnormality. No acute or significant osseous findings. Review of the MIP images confirms the above findings. IMPRESSION: No definite evidence of pulmonary embolus. Findings consistent with multifocal pneumonia. Electronically Signed   By: Lupita Raider M.D.   On: 12/03/2021 12:46   DG Chest Port 1 View  Result Date: 12/03/2021 CLINICAL DATA:  Shortness of breath EXAM: PORTABLE CHEST 1 VIEW COMPARISON:  Yesterday FINDINGS: Patchy bilateral pneumonia which is unchanged. Cardiomegaly that is stable. No visible effusion or pneumothorax. IMPRESSION: Unchanged multifocal pneumonia. Electronically Signed   By: Tiburcio Pea M.D.   On: 12/03/2021 06:59   DG Chest Port 1 View  Result Date: 12/02/2021 CLINICAL DATA:  COVID EXAM: PORTABLE CHEST 1 VIEW COMPARISON:  11/28/2021 FINDINGS: Unchanged patchy bilateral airspace disease. Cardiomegaly. No visible effusion or pneumothorax. IMPRESSION: Unchanged multifocal pneumonia. Electronically Signed  By: Tiburcio PeaJonathan  Watts M.D.   On: 12/02/2021 06:32   VAS US LOWER EXTREMITY VENOUS (DVT)  Result Date: 11/30/2021  Lower Venous DVT Study Patient Name:  Asencion IslamREESA S Leap  Date of Exam:   11/29/2021 Medical Rec #: [id number]        Accession #:    [id number] Date of Birth: [birth year]        Patient Gender: F Patient Age:   [age] years [facility] Procedure:      VAS US LOWER EXTREMITY VENOUS (DVT) Referring Phys: Alphonzo LemmingsWHITNEY HARRIS --------------------------------------------------------------------------------  Indications: COVID.  Elevated D-dimer 10.4.  Limitations: Body habitus and poor ultrasound/tissue interface. Comparison Study: No prior study Performing Technologist: Gertie FeyMichelle Simonetti MHA, RDMS, RVT, RDCS  Examination Guidelines: A complete evaluation includes B-mode imaging, spectral Doppler, color Doppler, and power Doppler as needed of all accessible portions of each vessel. Bilateral testing is considered an integral part of a complete examination. Limited examinations for reoccurring indications may be performed as noted. The reflux portion of the exam is performed with the patient in reverse Trendelenburg.  +---------+---------------+---------+-----------+----------+--------------+ RIGHT    CompressibilityPhasicitySpontaneityPropertiesThrombus Aging +---------+---------------+---------+-----------+----------+--------------+ FV Prox  Full                                                        +---------+---------------+---------+-----------+----------+--------------+ FV Mid   Full                                                        +---------+---------------+---------+-----------+----------+--------------+ FV DistalFull                                                        +---------+---------------+---------+-----------+----------+--------------+ POP      Full           Yes      Yes                                 +---------+---------------+---------+-----------+----------+--------------+ PTV      Full                    Yes                                 +---------+---------------+---------+-----------+----------+--------------+ PERO     Full                    Yes                                 +---------+---------------+---------+-----------+----------+--------------+   Right Technical Findings: Not visualized segments include CFV, SFJ, PFV.  +---------+---------------+---------+-----------+----------+--------------+ LEFT      CompressibilityPhasicitySpontaneityPropertiesThrombus Aging +---------+---------------+---------+-----------+----------+--------------+ CFV      Full           Yes      Yes                                 +---------+---------------+---------+-----------+----------+--------------+  FV Prox  Full                                                        +---------+---------------+---------+-----------+----------+--------------+ FV Mid   Full                                                        +---------+---------------+---------+-----------+----------+--------------+ FV DistalFull                                                        +---------+---------------+---------+-----------+----------+--------------+ PFV      Full                                                        +---------+---------------+---------+-----------+----------+--------------+ POP      Full           Yes      Yes                                 +---------+---------------+---------+-----------+----------+--------------+ PTV      Full                    Yes                                 +---------+---------------+---------+-----------+----------+--------------+ PERO     Full                    Yes                                 +---------+---------------+---------+-----------+----------+--------------+   Left Technical Findings: Not visualized segments include SFJ.   Summary: RIGHT: - There is no evidence of deep vein thrombosis in the lower extremity. However, portions of this examination were limited- see technologist comments above.  - No cystic structure found in the popliteal fossa.  LEFT: - There is no evidence of deep vein thrombosis in the lower extremity. However, portions of this examination were limited- see technologist comments above.  - No cystic structure found in the popliteal fossa.  *See table(s) above for measurements and observations. Electronically signed by  Gerarda Fraction on 11/30/2021 at 12:48:47 PM.    Final    NM PULMONARY VENT AND PERF (V/Q Scan)  Result Date: 11/28/2021 CLINICAL DATA:  Chest pain. EXAM: NUCLEAR MEDICINE PERFUSION LUNG SCAN TECHNIQUE: Perfusion images were obtained in multiple projections after intravenous injection of radiopharmaceutical. Ventilation scans intentionally deferred if perfusion scan and chest x-ray adequate for interpretation during COVID 19 epidemic. RADIOPHARMACEUTICALS:  4.4 mCi Tc-46m MAA IV COMPARISON:  Chest radiograph dated 11/29/2018. FINDINGS: There are bilateral perfusion defects which  correspond to the areas of airspace opacity on the chest radiograph. Evaluation for PE on perfusion scan is limited in the presence of airspace disease. CT angiography may provide better evaluation if there is high clinical concern for acute PE. IMPRESSION: Indeterminate for pulmonary embolism. Electronically Signed   By: Elgie Collard M.D.   On: 11/28/2021 20:15   DG Chest Port 1 View  Result Date: 11/28/2021 CLINICAL DATA:  Chest pain EXAM: PORTABLE CHEST 1 VIEW COMPARISON:  November 27, 2021 FINDINGS: Bilateral pulmonary infiltrates. The right-sided infiltrates have worsened in the interval. The left-sided infiltrates are stable. No other changes. No pneumothorax. IMPRESSION: Worsening of right patchy pulmonary infiltrates. Stable left pulmonary infiltrates. No other interval changes. Electronically Signed   By: Gerome Sam III M.D.   On: 11/28/2021 17:08   ECHOCARDIOGRAM LIMITED  Result Date: 11/28/2021    ECHOCARDIOGRAM LIMITED REPORT   Patient Name:   HILJA KINTZEL Date of Exam: 11/28/2021 Medical Rec #:  [id number]       Height:       62.0 in Accession #:    [id number]      Weight:       227.7 lb Date of Birth:  February 24, 1961       BSA:          2.020 m Patient Age:    61 years        BP:           89/48 mmHg Patient Gender: F               HR:           90 bpm. Exam Location:  Inpatient Procedure: Limited Color Doppler and  Cardiac Doppler Indications:    chest pain  History:        Patient has no prior history of Echocardiogram examinations.                 Covid.; Risk Factors:Sleep Apnea and Diabetes.  Sonographer:    Delcie Roch RDCS Referring Phys: [phone] [facility] D HARRIS  Sonographer Comments: Bi-pap and Image acquisition challenging due to patient body habitus. IMPRESSIONS  1. Left ventricular ejection fraction, by estimation, is 55 to 60%. The left ventricle has normal function. The left ventricle has no regional wall motion abnormalities. Left ventricular diastolic parameters were normal.  2. Right ventricular systolic function is normal. The right ventricular size is normal. Tricuspid regurgitation signal is inadequate for assessing PA pressure.  3. The mitral valve is normal in structure. Mild to moderate mitral valve regurgitation.  4. The aortic valve was not well visualized. Aortic valve regurgitation is not visualized. No aortic stenosis is present.  5. The inferior vena cava is normal in size with greater than 50% respiratory variability, suggesting right atrial pressure of 3 mmHg. FINDINGS  Left Ventricle: Left ventricular ejection fraction, by estimation, is 55 to 60%. The left ventricle has normal function. The left ventricle has no regional wall motion abnormalities. The left ventricular internal cavity size was normal in size. There is  no left ventricular hypertrophy. Left ventricular diastolic parameters were normal. Normal left ventricular filling pressure. Right Ventricle: The right ventricular size is normal. No increase in right ventricular wall thickness. Right ventricular systolic function is normal. Tricuspid regurgitation signal is inadequate for assessing PA pressure. Left Atrium: Left atrial size was normal in size. Right Atrium: Right atrial size was normal in size. Pericardium: There is no evidence of pericardial effusion. Mitral Valve: The mitral  valve is normal in structure. Mild to moderate  mitral valve regurgitation, with centrally-directed jet. Tricuspid Valve: The tricuspid valve is normal in structure. Tricuspid valve regurgitation is not demonstrated. Aortic Valve: The aortic valve was not well visualized. Aortic valve regurgitation is not visualized. No aortic stenosis is present. Pulmonic Valve: The pulmonic valve was not well visualized. Pulmonic valve regurgitation is not visualized. Aorta: The aortic root is normal in size and structure. Venous: The inferior vena cava is normal in size with greater than 50% respiratory variability, suggesting right atrial pressure of 3 mmHg. IAS/Shunts: The interatrial septum was not assessed. LEFT VENTRICLE PLAX 2D LVIDd:         4.20 cm     Diastology LVIDs:         3.00 cm     LV e' medial:    8.49 cm/s LV PW:         0.90 cm     LV E/e' medial:  10.7 LV IVS:        0.80 cm     LV e' lateral:   10.20 cm/s                            LV E/e' lateral: 8.9  LV Volumes (MOD) LV vol d, MOD A4C: 71.3 ml LV vol s, MOD A4C: 41.4 ml LV SV MOD A4C:     71.3 ml IVC IVC diam: 1.70 cm LEFT ATRIUM         Index LA diam:    3.10 cm 1.53 cm/m  AORTIC VALVE LVOT Vmax:   123.00 cm/s LVOT Vmean:  75.600 cm/s LVOT VTI:    0.196 m  AORTA Ao Asc diam: 2.90 cm MITRAL VALVE MV Area (PHT): 4.31 cm    SHUNTS MV Decel Time: 176 msec    Systemic VTI: 0.20 m MV E velocity: 90.90 cm/s MV A velocity: 73.90 cm/s MV E/A ratio:  1.23 Mihai Croitoru MD Electronically signed by Thurmon Fair MD Signature Date/Time: 11/28/2021/5:05:08 PM    Final    DG Chest Port 1 View  Result Date: 11/27/2021 CLINICAL DATA:  Questionable sepsis.  COVID. EXAM: PORTABLE CHEST 1 VIEW COMPARISON:  Chest x-ray 08/06/2008 FINDINGS: There is multifocal airspace consolidation throughout both lungs most significant in the left upper lobe. There is no pleural effusion or pneumothorax. Cardiomediastinal silhouette is within normal limits. No acute fractures are seen. IMPRESSION: 1. Multifocal airspace  consolidation, most significant in the left upper lobe, worrisome for multifocal pneumonia. Follow-up imaging recommended in 4-6 weeks to confirm complete resolution. Electronically Signed   By: Darliss Cheney M.D.   On: 11/27/2021 16:07    Today   Subjective    Jayleana Andria Meuse today has no headache,no chest abdominal pain,no new weakness tingling or numbness, feels much better wants to go home today.    Objective   Blood pressure 137/68, pulse 83, temperature 98.6 F (37 C), temperature source Oral, resp. rate 19, height  (1.575 m), weight 106 kg, SpO2 91 %.   Intake/Output Summary (Last 24 hours) at 12/06/2021 1256 Last data filed at 12/06/2021 0600 Gross per 24 hour  Intake 120 ml  Output 2200 ml  Net -2080 ml    Exam  Awake Alert, No new F.N deficits,    [place].AT,PERRAL Supple Neck,   Symmetrical Chest wall movement, Good air movement bilaterally, few rales RRR,No Gallops,   +ve B.Sounds, Abd Soft, Non tender,  No Cyanosis,  Clubbing or edema    Data Review   Recent Labs  Lab 12/01/21 0133 12/02/21 0511 12/03/21 0522 12/04/21 0309 12/05/21 0802 12/06/21 0853  WBC 17.0* 15.7* 13.0* 12.9* 14.6* 14.7*  HGB 11.3* 12.2 11.8* 12.3 12.8 13.7  HCT 33.6* 37.3 35.8* 37.1 38.9 41.0  PLT 166 193 237 259 239 233  MCV 91.8 92.6 91.8 92.1 93.5 92.6  MCH 30.9 30.3 30.3 30.5 30.8 30.9  MCHC 33.6 32.7 33.0 33.2 32.9 33.4  RDW 13.8 13.7 13.7 13.3 13.4 13.3  LYMPHSABS 0.8 1.3  --  1.3 1.9 2.6  MONOABS 0.3 0.6  --  0.9 0.4 0.1  EOSABS 0.0 0.0  --  0.0 0.0 0.0  BASOSABS 0.0 0.0  --  0.0 0.0 0.0    Recent Labs  Lab 11/30/21 0323 11/30/21 2039 12/01/21 0133 12/02/21 0511 12/02/21 0646 12/02/21 0955 12/03/21 0522 12/04/21 0309 12/05/21 0802 12/06/21 0853 12/06/21 1120  NA 137  --  135 136  --   --  137 135 136  --  135  K 3.7  --  3.8 4.0  --   --  4.3 3.9 4.0  --  3.9  CL 103  --  102 102  --   --  101 98 100  --  97*  CO2 26  --  24 26  --   --  28 27 28   --  29   GLUCOSE 169*  --  277* 167*  --   --  132* 120* 102*  --  110*  BUN 17  --  16 14  --   --  15 17 14   --  13  CREATININE 0.76  --  0.72 0.63  --   --  0.68 0.72 0.74  --  0.69  CALCIUM 7.4*  --  7.3* 7.4*  --   --  7.8* 7.8* 7.9*  --  8.2*  AST 34  --  20 19  --   --   --  15 20  --  23  ALT 24  --  22 20  --   --   --  15 16  --  20  ALKPHOS 58  --  55 78  --   --   --  64 57  --  62  BILITOT 0.4  --  0.6 1.0  --   --   --  0.6 0.6  --  0.7  ALBUMIN 2.2*  --  2.3* 2.3*  --   --   --  2.3* 2.4*  --  2.5*  MG 2.8*  --  2.6* 2.3  --   --  2.3 2.1 2.0  --  1.9  PHOS 1.6* 1.6* 1.9* 2.8  --   --   --   --   --   --   --   CRP  --   --   --   --  12.2*  --   --   --   --   --   --   DDIMER  --   --   --   --   --  2.41* 1.58* 1.21* 1.06* 4.51*  --   PROCALCITON  --   --   --  8.34  --   --  4.53 2.58 1.54  --   --   BNP  --   --   --  251.1*  --   --  130.0* 62.3  --   --   --  Total Time in preparing paper work, data evaluation and todays exam - 35 minutes  Susa Raring M.D on 12/06/2021 at 12:56 PM  [facility]

## 2021-12-06 NOTE — Discharge Instructions (Addendum)
Follow with Primary MD Clovis Riley, L.August Saucer, MD in 7 days   Get CBC, CMP, Magnesium, 2 view Chest X ray -  checked next visit within 1 week by Primary MD    Activity: As tolerated with Full fall precautions use walker/cane & assistance as needed  Disposition Home   Diet: Heart Healthy Low Carb  Accuchecks 4 times/day, Once in AM empty stomach and then before each meal. Log in all results and show them to your Prim.MD in 3 days. If any glucose reading is under 80 or above 300 call your Prim MD immidiately. Follow Low glucose instructions for glucose under 80 as instructed.   Special Instructions: If you have smoked or chewed Tobacco  in the last 2 yrs please stop smoking, stop any regular Alcohol  and or any Recreational drug use.  On your next visit with your primary care physician please Get Medicines reviewed and adjusted.  Please request your Prim.MD to go over all Hospital Tests and Procedure/Radiological results at the follow up, please get all Hospital records sent to your Prim MD by signing hospital release before you go home.  If you experience worsening of your admission symptoms, develop shortness of breath, life threatening emergency, suicidal or homicidal thoughts you must seek medical attention immediately by calling 911 or calling your MD immediately  if symptoms less severe.  You Must read complete instructions/literature along with all the possible adverse reactions/side effects for all the Medicines you take and that have been prescribed to you. Take any new Medicines after you have completely understood and accpet all the possible adverse reactions/side effects.         Person Under Monitoring Name: Alejandra [name]  Location: [address] [place] [address]   Infection Prevention Recommendations for Individuals Confirmed to have, or Being Evaluated for, 2019 Novel Coronavirus (COVID-19) Infection Who Receive Care at Home  Individuals who are  confirmed to have, or are being evaluated for, COVID-19 should follow the prevention steps below until a healthcare provider or local or state health department says they can return to normal activities.  Stay home except to get medical care You should restrict activities outside your home, except for getting medical care. Do not go to work, school, or public areas, and do not use public transportation or taxis.  Call ahead before visiting your doctor Before your medical appointment, call the healthcare provider and tell them that you have, or are being evaluated for, COVID-19 infection. This will help the healthcare provider's office take steps to keep other people from getting infected. Ask your healthcare provider to call the local or state health department.  Monitor your symptoms Seek prompt medical attention if your illness is worsening (e.g., difficulty breathing). Before going to your medical appointment, call the healthcare provider and tell them that you have, or are being evaluated for, COVID-19 infection. Ask your healthcare provider to call the local or state health department.  Wear a facemask You should wear a facemask that covers your nose and mouth when you are in the same room with other people and when you visit a healthcare provider. People who live with or visit you should also wear a facemask while they are in the same room with you.  Separate yourself from other people in your home As much as possible, you should stay in a different room from other people in your home. Also, you should use a separate bathroom, if available.  Avoid sharing household items You should  not share dishes, drinking glasses, cups, eating utensils, towels, bedding, or other items with other people in your home. After using these items, you should wash them thoroughly with soap and water.  Cover your coughs and sneezes Cover your mouth and nose with a tissue when you cough or sneeze, or  you can cough or sneeze into your sleeve. Throw used tissues in a lined trash can, and immediately wash your hands with soap and water for at least 20 seconds or use an alcohol-based hand rub.  Wash your [employer] your hands often and thoroughly with soap and water for at least 20 seconds. You can use an alcohol-based hand sanitizer if soap and water are not available and if your hands are not visibly dirty. Avoid touching your eyes, nose, and mouth with unwashed hands.   Prevention Steps for Caregivers and Household Members of Individuals Confirmed to have, or Being Evaluated for, COVID-19 Infection Being Cared for in the Home  If you live with, or provide care at home for, a person confirmed to have, or being evaluated for, COVID-19 infection please follow these guidelines to prevent infection:  Follow healthcare provider's instructions Make sure that you understand and can help the patient follow any healthcare provider instructions for all care.  Provide for the patient's basic needs You should help the patient with basic needs in the home and provide support for getting groceries, prescriptions, and other personal needs.  Monitor the patient's symptoms If they are getting sicker, call his or her medical provider and tell them that the patient has, or is being evaluated for, COVID-19 infection. This will help the healthcare provider's office take steps to keep other people from getting infected. Ask the healthcare provider to call the local or state health department.  Limit the number of people who have contact with the patient If possible, have only one caregiver for the patient. Other household members should stay in another home or place of residence. If this is not possible, they should stay in another room, or be separated from the patient as much as possible. Use a separate bathroom, if available. Restrict visitors who do not have an essential need to be in the  home.  Keep older adults, very young children, and other sick people away from the patient Keep older adults, very young children, and those who have compromised immune systems or chronic health conditions away from the patient. This includes people with chronic heart, lung, or kidney conditions, diabetes, and cancer.  Ensure good ventilation Make sure that shared spaces in the home have good air flow, such as from an air conditioner or an opened window, weather permitting.  Wash your hands often Wash your hands often and thoroughly with soap and water for at least 20 seconds. You can use an alcohol based hand sanitizer if soap and water are not available and if your hands are not visibly dirty. Avoid touching your eyes, nose, and mouth with unwashed hands. Use disposable paper towels to dry your hands. If not available, use dedicated cloth towels and replace them when they become wet.  Wear a facemask and gloves Wear a disposable facemask at all times in the room and gloves when you touch or have contact with the patient's blood, body fluids, and/or secretions or excretions, such as sweat, saliva, sputum, nasal mucus, vomit, urine, or feces.  Ensure the mask fits over your nose and mouth tightly, and do not touch it during use. Throw out disposable facemasks and  gloves after using them. Do not reuse. Wash your hands immediately after removing your facemask and gloves. If your personal clothing becomes contaminated, carefully remove clothing and launder. Wash your hands after handling contaminated clothing. Place all used disposable facemasks, gloves, and other waste in a lined container before disposing them with other household waste. Remove gloves and wash your hands immediately after handling these items.  Do not share dishes, glasses, or other household items with the patient Avoid sharing household items. You should not share dishes, drinking glasses, cups, eating utensils, towels,  bedding, or other items with a patient who is confirmed to have, or being evaluated for, COVID-19 infection. After the person uses these items, you should wash them thoroughly with soap and water.  Wash laundry thoroughly Immediately remove and wash clothes or bedding that have blood, body fluids, and/or secretions or excretions, such as sweat, saliva, sputum, nasal mucus, vomit, urine, or feces, on them. Wear gloves when handling laundry from the patient. Read and follow directions on labels of laundry or clothing items and detergent. In general, wash and dry with the warmest temperatures recommended on the label.  Clean all areas the individual has used often Clean all touchable surfaces, such as counters, tabletops, doorknobs, bathroom fixtures, toilets, phones, keyboards, tablets, and bedside tables, every day. Also, clean any surfaces that may have blood, body fluids, and/or secretions or excretions on them. Wear gloves when cleaning surfaces the patient has come in contact with. Use a diluted bleach solution (e.g., dilute bleach with 1 part bleach and 10 parts water) or a household disinfectant with a label that says EPA-registered for coronaviruses. To make a bleach solution at home, add 1 tablespoon of bleach to 1 quart (4 cups) of water. For a larger supply, add  cup of bleach to 1 gallon (16 cups) of water. Read labels of cleaning products and follow recommendations provided on product labels. Labels contain instructions for safe and effective use of the cleaning product including precautions you should take when applying the product, such as wearing gloves or eye protection and making sure you have good ventilation during use of the product. Remove gloves and wash hands immediately after cleaning.  Monitor yourself for signs and symptoms of illness Caregivers and household members are considered close contacts, should monitor their health, and will be asked to limit movement outside of  the home to the extent possible. Follow the monitoring steps for close contacts listed on the symptom monitoring form.   ? If you have additional questions, contact your local health department or call the epidemiologist on call at [phone] (available 24/7). ? This guidance is subject to change. For the most up-to-date guidance from [facility], please refer to their website: [email or web address]

## 2021-12-16 ENCOUNTER — Other Ambulatory Visit: Payer: Self-pay | Admitting: Family Medicine

## 2021-12-16 ENCOUNTER — Ambulatory Visit
Admission: RE | Admit: 2021-12-16 | Discharge: 2021-12-16 | Disposition: A | Discharge status: Home / Self Care | Payer: Self-pay | Source: Ambulatory Visit | Attending: Family Medicine | Admitting: Family Medicine

## 2021-12-16 DIAGNOSIS — J189 Pneumonia, unspecified organism: Secondary | ICD-10-CM

## 2021-12-16 DIAGNOSIS — Z8616 Personal history of COVID-19: Secondary | ICD-10-CM

## 2021-12-20 ENCOUNTER — Other Ambulatory Visit: Payer: Self-pay | Admitting: Family Medicine

## 2021-12-20 DIAGNOSIS — R300 Dysuria: Secondary | ICD-10-CM

## 2021-12-22 ENCOUNTER — Ambulatory Visit (INDEPENDENT_AMBULATORY_CARE_PROVIDER_SITE_OTHER): Payer: 59

## 2021-12-22 DIAGNOSIS — R300 Dysuria: Secondary | ICD-10-CM

## 2021-12-22 DIAGNOSIS — R109 Unspecified abdominal pain: Secondary | ICD-10-CM

## 2021-12-29 ENCOUNTER — Telehealth: Payer: Self-pay | Admitting: Cardiology

## 2021-12-29 NOTE — Progress Notes (Unsigned)
Cardiology Office Note:    Date:  12/30/2021   ID:  [name], DOB 1961-03-16, MRN [id number]  PCP:  Asencion Gowda.August Saucer, MD   [facility] Providers Cardiologist:  Rollene Rotunda, MD  Referring MD: Clovis Riley, Elbert Ewings.August Saucer, MD   Chief Complaint  Patient presents with   Hospitalization Follow-up    dyspnea    History of Present Illness:    Alejandra [name] is a 61 y.o. female with a hx of DM, arthritis, obesity, OSA not compliant on CPAP, and HFpEF.  She has no prior cardiac history and was evaluated by our team during hospitalization on 11/29/2021.  She was diagnosed with COVID-19 and was quarantining.  However 4 days later she developed AMS and was brought to the ER.  Due to elevated troponin to 2836, cardiology was consulted.  Echocardiogram showed LVEF 55 to 60% and normal RV size and function with no regional wall motion abnormality.  Cardiology recommended medical treatment for suspected demand ischemia.  VQ scan was indeterminate for PE.  She was treated with IV heparin and discharged on aspirin.  Required BiPAP treatment and was treated for multifocal pneumonia as well as COVID-19 infection, septic shock with an elevated lactic acid and procalcitonin greater than 150, and toxic/metabolic encephalopathy.  She required pressor support on admission with Levophed and vasopressin.  She completed remdesivir treatment, steroids, and antibiotics.  She was diuresed with Lasix for hypervolemia and significant orthopnea.  Troponin did trend down prior to discharge.  Cardiology recommended possible ischemic evaluation once her pulmonary issues were improved.  Our office was contacted yesterday for significant shortness of breath and crackles in her bases.  She was added to my schedule. She states she felt well immediately after discharge, but then went back downhill.  Home PT worked with her with walking and sitting /standing exercises. She states that she felt something move in her chest and  since then has felt unwell. She reports needing more O2 than when she was discharged. She states that in the last week she has felt more short of breath.  She was restarted on levaquin yesterday. After one dose of levaquin she feels improved, but still requiring 4L O2.     She is very upset at the way she was treated during her hospitalization at [facility].     Past Medical History:  Diagnosis Date   Arthritis    Diabetes (HCC)     No past surgical history on file.  Current Medications: Current Meds  Medication Sig   albuterol (VENTOLIN HFA) 108 (90 Base) MCG/ACT inhaler Inhale 2 puffs into the lungs every 6 (six) hours as needed for wheezing or shortness of breath.   aspirin EC 81 MG tablet Take 1 tablet (81 mg total) by mouth daily. Swallow whole.   benzonatate (TESSALON PERLES) 100 MG capsule Take 2 capsules (200 mg total) by mouth 3 (three) times daily as needed for cough.   cetirizine (ZYRTEC) 10 MG chewable tablet Chew 10 mg by mouth daily.   dextromethorphan-guaiFENesin (MUCINEX DM) 30-600 MG 12hr tablet Take 1 tablet by mouth 2 (two) times daily as needed for cough.   glimepiride (AMARYL) 4 MG tablet Take 4 mg by mouth daily.   losartan (COZAAR) 50 MG tablet    misoprostol (CYTOTEC) 100 MCG tablet Take 100 mcg by mouth daily as needed (For Inflammation per spouse).   oxymetazoline (AFRIN) 0.05 % nasal spray Place 1 spray into both nostrils 2 (two) times daily.   pantoprazole (PROTONIX) 40  MG tablet Take 1 tablet (40 mg total) by mouth daily.   TRULICITY 1.5 MG/0.5ML SOPN Inject 1.5 mg into the skin daily.   verapamil (CALAN-SR) 120 MG CR tablet      Allergies:   Atorvastatin, Bactrim [sulfamethoxazole-trimethoprim], Celebrex [celecoxib], Codeine, Dilaudid [hydromorphone hcl], Hydrochlorothiazide, Meloxicam, Morphine and related, Tramadol, Wound dressing adhesive, Amoxicillin-pot clavulanate, Other, and Wellbutrin [bupropion]   Social History   Socioeconomic History   Marital  status: Married    Spouse name: Not on file   Number of children: Not on file   Years of education: Not on file   Highest education level: Not on file  Occupational History   Not on file  Tobacco Use   Smoking status: Former   Smokeless tobacco: Never  Substance and Sexual Activity   Alcohol use: No   Drug use: No   Sexual activity: Not on file  Other Topics Concern   Not on file  Social History Narrative   Not on file   Social Determinants of Health   Financial Resource Strain: Not on file  Food Insecurity: Not on file  Transportation Needs: Not on file  Physical Activity: Not on file  Stress: Not on file  Social Connections: Not on file     Family History: The patient's family history includes Hypertension in her father and mother.  ROS:   Please see the history of present illness.     All other systems reviewed and are negative.  EKGs/Labs/Other Studies Reviewed:    The following studies were reviewed today:  Echo 2023  1. Left ventricular ejection fraction, by estimation, is 55 to 60%. The  left ventricle has normal function. The left ventricle has no regional  wall motion abnormalities. Left ventricular diastolic parameters were  normal.   2. Right ventricular systolic function is normal. The right ventricular  size is normal. Tricuspid regurgitation signal is inadequate for assessing  PA pressure.   3. The mitral valve is normal in structure. Mild to moderate mitral valve  regurgitation.   4. The aortic valve was not well visualized. Aortic valve regurgitation  is not visualized. No aortic stenosis is present.   5. The inferior vena cava is normal in size with greater than 50%  respiratory variability, suggesting right atrial pressure of 3 mmHg  EKG:  EKG is not ordered today.    Recent Labs: 12/04/2021: B Natriuretic Peptide 62.3 12/06/2021: ALT 20; BUN 13; Creatinine, Ser 0.69; Hemoglobin 13.7; Magnesium 1.9; Platelets 233; Potassium 3.9; Sodium 135   Recent Lipid Panel    Component Value Date/Time   CHOL 73 11/30/2021 0323   TRIG 81 11/30/2021 0323   HDL 25 (L) 11/30/2021 0323   CHOLHDL 2.9 11/30/2021 0323   VLDL 16 11/30/2021 0323   LDLCALC 32 11/30/2021 0323     Risk Assessment/Calculations:                Physical Exam:    VS:  BP 128/72   Pulse 96   Ht 5\' 2"  (1.575 m)   Wt 226 lb 6.6 oz (102.7 kg)   SpO2 93%   BMI 41.41 kg/m     Wt Readings from Last 3 Encounters:  12/30/21 226 lb 6.6 oz (102.7 kg)  12/04/21 233 lb 11 oz (106 kg)     GEN:  Well nourished, well developed in no acute distress HEENT: Normal NECK: No JVD; No carotid bruits LYMPHATICS: No lymphadenopathy CARDIAC: RRR, no murmurs, rubs, gallops RESPIRATORY:  occasional wheeze, no  crackles ABDOMEN: Soft, non-tender, non-distended MUSCULOSKELETAL:  No edema; No deformity  SKIN: Warm and dry NEUROLOGIC:  Alert and oriented x 3 PSYCHIATRIC:  Normal affect   ASSESSMENT:    1. Pneumonia due to COVID-19 virus   2. Chronic respiratory failure with hypoxia (HCC)   3. Shortness of breath   4. Diastolic dysfunction without heart failure   5. Mitral valve insufficiency, unspecified etiology   6. Elevated troponin   7. Primary hypertension    PLAN:    In order of problems listed above:  Shortness of breath Sounds like this a recurrence of pneumonia, feels slightly improved after 1 dose of levaquin. Will refer to pulmonology.  I have a low suspicion that this is cardiac related.  She is not hypervolemic on exam.  I do not appreciate crackles today.   Recent COVID-19 infection, septic shock, bacterial pneumonia Acute on chronic respiratory failure with hypoxia COVID-19 on 11/26/2021 Discharged on 12/06/2021 She remains on O2 at home, but now on 4L resulting in 93% O2 sat here in the office   HFpEF Mild to moderate MR Echocardiogram during her hospitalization showed an LVEF of 55-60% and normal diastolic function.  RV function was normal,  mild to moderate MR was noted.  I do not suspect that her diastolic dysfunction or MR is the main cause of her ongoing hypoxic respiratory failure.   Elevated troponin No reports of chest pain EKG remained nonischemic Troponin did downtrend prior to discharge Placed on aspirin   Hypertension Continued on verapamil, losartan Well-controlled, no medication changes           Medication Adjustments/Labs and Tests Ordered: Current medicines are reviewed at length with the patient today.  Concerns regarding medicines are outlined above.  Orders Placed This Encounter  Procedures   Ambulatory referral to Pulmonology   Meds ordered this encounter  Medications   benzonatate (TESSALON PERLES) 100 MG capsule    Sig: Take 2 capsules (200 mg total) by mouth 3 (three) times daily as needed for cough.    Dispense:  30 capsule    Refill:  0    Patient Instructions  Medication Instructions:   -Tessalon Perles 200mg  as needed 3 times a day.  *If you need a refill on your cardiac medications before your next appointment, please call your pharmacy*   Follow-Up: At [facility], you and your health needs are our priority.  As part of our continuing mission to provide you with exceptional heart care, we have created designated Provider Care Teams.  These Care Teams include your primary Cardiologist (physician) and Advanced Practice Providers (APPs -  Physician Assistants and Nurse Practitioners) who all work together to provide you with the care you need, when you need it.  We recommend signing up for the patient portal called "MyChart".  Sign up information is provided on this After Visit Summary.  MyChart is used to connect with patients for Virtual Visits (Telemedicine).  Patients are able to view lab/test results, encounter notes, upcoming appointments, etc.  Non-urgent messages can be sent to your provider as well.   To learn more about what you can do with MyChart, go to  [facility].    Your next appointment:   6-8 week(s)  The format for your next appointment:   In Person  Provider:   [email or web address], MD   Signed, Rollene Rotunda Priya Matsen, Roe Rutherford  12/30/2021 5:06 PM    [facility]

## 2021-12-29 NOTE — Telephone Encounter (Signed)
Received call to triage from Amy with [facility]. She reports that 2 weeks after d/c from hospital (bilateral PNA & CHF), patient has increasing sob and crackles in rt lung fields. Patient is on O2 4L/m with sat of 87% after PT. When patient is sitting, O2 sat 92-94 with 4L/m. No edema or tighter fitting clothing. Patient has night sweats and chills x 2 weeks. T 97.4, but has been taking tylenol. BP 158/72, P 93-106. Amy placed 2 calls to PCP with no answer yet. Patient has not been prescribed ABX for PNA and has not been prescribed lasix for crackles. Patient has appointment with Louisa Second, PA for 8/31. Patietn will bring with her an oxygen tank. Any orders for now?

## 2021-12-30 ENCOUNTER — Ambulatory Visit: Payer: 59 | Attending: Cardiology | Admitting: Physician Assistant

## 2021-12-30 ENCOUNTER — Encounter: Payer: Self-pay | Admitting: Physician Assistant

## 2021-12-30 VITALS — BP 128/72 | HR 96 | Ht 62.0 in | Wt 226.4 lb

## 2021-12-30 DIAGNOSIS — R778 Other specified abnormalities of plasma proteins: Secondary | ICD-10-CM

## 2021-12-30 DIAGNOSIS — R0602 Shortness of breath: Secondary | ICD-10-CM | POA: Diagnosis not present

## 2021-12-30 DIAGNOSIS — I340 Nonrheumatic mitral (valve) insufficiency: Secondary | ICD-10-CM

## 2021-12-30 DIAGNOSIS — J9611 Chronic respiratory failure with hypoxia: Secondary | ICD-10-CM | POA: Diagnosis not present

## 2021-12-30 DIAGNOSIS — U071 COVID-19: Secondary | ICD-10-CM | POA: Diagnosis not present

## 2021-12-30 DIAGNOSIS — J1282 Pneumonia due to coronavirus disease 2019: Secondary | ICD-10-CM

## 2021-12-30 DIAGNOSIS — I10 Essential (primary) hypertension: Secondary | ICD-10-CM

## 2021-12-30 DIAGNOSIS — I5189 Other ill-defined heart diseases: Secondary | ICD-10-CM

## 2021-12-30 MED ORDER — BENZONATATE 100 MG PO CAPS: 200.0000 mg | ORAL_CAPSULE | Freq: Three times a day (TID) | ORAL | 0 refills | Status: DC | PRN

## 2021-12-30 NOTE — Patient Instructions (Signed)
Medication Instructions:   -Tessalon Perles 200mg  as needed 3 times a day.  *If you need a refill on your cardiac medications before your next appointment, please call your pharmacy*   Follow-Up: At [facility], you and your health needs are our priority.  As part of our continuing mission to provide you with exceptional heart care, we have created designated Provider Care Teams.  These Care Teams include your primary Cardiologist (physician) and Advanced Practice Providers (APPs -  Physician Assistants and Nurse Practitioners) who all work together to provide you with the care you need, when you need it.  We recommend signing up for the patient portal called "MyChart".  Sign up information is provided on this After Visit Summary.  MyChart is used to connect with patients for Virtual Visits (Telemedicine).  Patients are able to view lab/test results, encounter notes, upcoming appointments, etc.  Non-urgent messages can be sent to your provider as well.   To learn more about what you can do with MyChart, go to [facility].    Your next appointment:   6-8 week(s)  The format for your next appointment:   In Person  Provider:   [email or web address], MD

## 2022-01-11 ENCOUNTER — Ambulatory Visit: Payer: 59 | Admitting: Cardiology

## 2022-01-21 ENCOUNTER — Institutional Professional Consult (permissible substitution): Payer: 59 | Admitting: Pulmonary Disease

## 2022-02-06 DIAGNOSIS — R7989 Other specified abnormal findings of blood chemistry: Secondary | ICD-10-CM | POA: Insufficient documentation

## 2022-02-06 DIAGNOSIS — I5031 Acute diastolic (congestive) heart failure: Secondary | ICD-10-CM | POA: Insufficient documentation

## 2022-02-06 NOTE — Progress Notes (Unsigned)
Cardiology Office Note   Date:  02/07/2022   ID:  Alejandra [name], DOB June 25, 1960, MRN [id number]  PCP:  Aurea Graff.Marlou Sa, MD  Cardiologist:   Minus Breeding, MD   Chief Complaint  Patient presents with   Shortness of Breath      History of Present Illness: Alejandra [name] is a 61 y.o. female who presents for follow-up shortness of breath  She was in the hospital recently with acute hypoxic respiratory failure secondary to COVID and pneumonia.  She had acute heart failure with an EF of 60%.  She also had AKI.  She had an elevated troponin.    She said that she has been treating herself for her continued O2 dependent respiratory failure.  She was on 4 L of oxygen and she is weaned herself down to 2.  She had a nurse come to her house 1 time who contacted physician who treated her with 10 days of Levaquin.  This was after she got out of the hospital and she thought that her breathing is gotten worse.  She did improve but now again she thinks with a change in the weather she is starting to get more heaviness in her chest.  She has had a little sharp pain.  She continues to have cough.  She is not describing new sputum production.  She sleeps in an adjustable bed.  She has had a little swelling in her left greater than right leg though none evident today.  She had difficulty sleeping.  She does have her first follow-up appointment with pulmonary.  She had to cancel the initial follow-up because of insurance issues.   Past Medical History:  Diagnosis Date   Arthritis    Diabetes ([place])     History reviewed. No pertinent surgical history.   Current Outpatient Medications  Medication Sig Dispense Refill   albuterol (VENTOLIN HFA) 108 (90 Base) MCG/ACT inhaler Inhale 2 puffs into the lungs every 6 (six) hours as needed for wheezing or shortness of breath. 8.5 g 0   ALPRAZolam (XANAX) 0.5 MG tablet Take 0.5 mg by mouth 3 (three) times daily as needed.     aspirin EC 81 MG tablet Take 1  tablet (81 mg total) by mouth daily. Swallow whole. 30 tablet 0   benzonatate (TESSALON PERLES) 100 MG capsule Take 2 capsules (200 mg total) by mouth 3 (three) times daily as needed for cough. 30 capsule 0   cetirizine (ZYRTEC) 10 MG chewable tablet Chew 10 mg by mouth daily.     glimepiride (AMARYL) 4 MG tablet Take 4 mg by mouth daily.     losartan (COZAAR) 50 MG tablet   11   misoprostol (CYTOTEC) 100 MCG tablet Take 100 mcg by mouth daily as needed (For Inflammation per spouse).     nitrofurantoin, macrocrystal-monohydrate, (MACROBID) 100 MG capsule Take 100 mg by mouth daily.     oxymetazoline (AFRIN) 0.05 % nasal spray Place 1 spray into both nostrils 2 (two) times daily. 30 mL 0   potassium chloride (KLOR-CON) 10 MEQ tablet Take 1 tablet (10 mEq total) by mouth daily. 3 tablet 0   torsemide (DEMADEX) 20 MG tablet Take 1 tablet (20 mg total) by mouth daily. 3 tablet 0   TRULICITY 1.5 0000000 SOPN Inject 1.5 mg into the skin daily.     verapamil (CALAN-SR) 120 MG CR tablet   11   No current facility-administered medications for this visit.    Allergies:   Atorvastatin,  Bactrim [sulfamethoxazole-trimethoprim], Celebrex [celecoxib], Codeine, Dilaudid [hydromorphone hcl], Dilaudid [hydromorphone], Hydrochlorothiazide, Meloxicam, Morphine and related, Tape, Tramadol, Wound dressing adhesive, Amoxicillin-pot clavulanate, Other, and Wellbutrin [bupropion]    ROS:  Please see the history of present illness.   Otherwise, review of systems are positive for none.   All other systems are reviewed and negative.    PHYSICAL EXAM: VS:  BP (!) 151/77   Pulse 94   Ht 5\' 2"  (1.575 m)   Wt 229 lb 12.8 oz (104.2 kg)   SpO2 98%   BMI 42.03 kg/m  , BMI Body mass index is 42.03 kg/m. GENERAL:  Well appearing HEENT:  Pupils equal round and reactive, fundi not visualized, oral mucosa unremarkable NECK:  No jugular venous distention, waveform within normal limits, carotid upstroke brisk and symmetric,  no bruits, no thyromegaly LYMPHATICS:  No cervical, inguinal adenopathy LUNGS:  Clear to auscultation bilaterally BACK:  No CVA tenderness CHEST:  Unremarkable HEART:  PMI not displaced or sustained,S1 and S2 within normal limits, no S3, no S4, no clicks, no rubs, no murmurs ABD:  Flat, positive bowel sounds normal in frequency in pitch, no bruits, no rebound, no guarding, no midline pulsatile mass, no hepatomegaly, no splenomegaly EXT:  2 plus pulses throughout, no edema, no cyanosis no clubbing SKIN:  No rashes no nodules NEURO:  Cranial nerves II through XII grossly intact, motor grossly intact throughout PSYCH:  Cognitively intact, oriented to person place and time    EKG:  EKG is not ordered today.    Recent Labs: 12/04/2021: B Natriuretic Peptide 62.3 12/06/2021: ALT 20; BUN 13; Creatinine, Ser 0.69; Hemoglobin 13.7; Magnesium 1.9; Platelets 233; Potassium 3.9; Sodium 135    Lipid Panel    Component Value Date/Time   CHOL 73 11/30/2021 0323   TRIG 81 11/30/2021 0323   HDL 25 (L) 11/30/2021 0323   CHOLHDL 2.9 11/30/2021 0323   VLDL 16 11/30/2021 0323   LDLCALC 32 11/30/2021 0323      Wt Readings from Last 3 Encounters:  02/07/22 229 lb 12.8 oz (104.2 kg)  12/30/21 226 lb 6.6 oz (102.7 kg)  12/04/21 233 lb 11 oz (106 kg)      Other studies Reviewed: Additional studies/ records that were reviewed today include: Hospital records. Review of the above records demonstrates:  Please see elsewhere in the note.     ASSESSMENT AND PLAN:  Elevated troponin: This was likely demand ischemia.  I might consider ischemia work-up in the future but there was no high pretest probability that she was having obstructive coronary disease and she is not describing new chest discomfort.  Acute diastolic HF: I am going to check a BNP level and a basic metabolic profile level.  I Minna give her Demadex 20 mg for 3 days with 10 mill equivalents of potassium to see if her breathing  improves.  Respiratory failure: She has follow-up with pulmonary.  No change in therapy.  MR: This was mild in the past.  I will follow-up with echocardiography probably 1 year from the time of her hospitalization.  Sleep apnea:   She is not using it.  She will discuss this further at her pulmonary appointment.  HTN: Blood pressure is mildly elevated today but she says this is unusual.  I will have her keep a blood pressure diary  Current medicines are reviewed at length with the patient today.  The patient does not have concerns regarding medicines.  The following changes have been made: As above  Labs/ tests ordered today include:   Orders Placed This Encounter  Procedures   Brain natriuretic peptide   Basic metabolic panel     Disposition:   FU with Angie Duke in 4 months.     Signed, Minus Breeding, MD  02/07/2022 1:32 PM    [place]

## 2022-02-07 ENCOUNTER — Ambulatory Visit: Payer: Commercial Managed Care - HMO | Attending: Cardiology | Admitting: Cardiology

## 2022-02-07 ENCOUNTER — Encounter: Payer: Self-pay | Admitting: Cardiology

## 2022-02-07 VITALS — BP 151/77 | HR 94 | Ht 62.0 in | Wt 229.8 lb

## 2022-02-07 DIAGNOSIS — R7989 Other specified abnormal findings of blood chemistry: Secondary | ICD-10-CM

## 2022-02-07 DIAGNOSIS — I5031 Acute diastolic (congestive) heart failure: Secondary | ICD-10-CM | POA: Diagnosis not present

## 2022-02-07 MED ORDER — POTASSIUM CHLORIDE ER 10 MEQ PO TBCR: 10.0000 meq | EXTENDED_RELEASE_TABLET | Freq: Every day | ORAL | 0 refills | Status: AC

## 2022-02-07 MED ORDER — TORSEMIDE 20 MG PO TABS: 20.0000 mg | ORAL_TABLET | Freq: Every day | ORAL | 0 refills | Status: DC

## 2022-02-07 NOTE — Patient Instructions (Addendum)
Medication Instructions:   -Take toresemide (demadex) 20mg  once daily for 3 days.  -Take potassium chloride 51meq once daily for 3 days.  *If you need a refill on your cardiac medications before your next appointment, please call your pharmacy*   Lab Work: Your physician recommends that you have labs drawn today: BNP and BMET  If you have labs (blood work) drawn today and your tests are completely normal, you will receive your results only by: [place] (if you have MyChart) OR A paper copy in the mail If you have any lab test that is abnormal or we need to change your treatment, we will call you to review the results.   Follow-Up: At [facility], you and your health needs are our priority.  As part of our continuing mission to provide you with exceptional heart care, we have created designated Provider Care Teams.  These Care Teams include your primary Cardiologist (physician) and Advanced Practice Providers (APPs -  Physician Assistants and Nurse Practitioners) who all work together to provide you with the care you need, when you need it.  We recommend signing up for the patient portal called "MyChart".  Sign up information is provided on this After Visit Summary.  MyChart is used to connect with patients for Virtual Visits (Telemedicine).  Patients are able to view lab/test results, encounter notes, upcoming appointments, etc.  Non-urgent messages can be sent to your provider as well.   To learn more about what you can do with MyChart, go to [email or web address].    Your next appointment:   4 month(s)  The format for your next appointment:   In Person  Provider:   Fabian Sharp, PA-C

## 2022-02-08 LAB — BASIC METABOLIC PANEL
BUN/Creatinine Ratio: 21 (ref 12–28)
BUN: 12 mg/dL (ref 8–27)
CO2: 23 mmol/L (ref 20–29)
Calcium: 9.6 mg/dL (ref 8.7–10.3)
Chloride: 97 mmol/L (ref 96–106)
Creatinine, Ser: 0.58 mg/dL (ref 0.57–1.00)
Glucose: 172 mg/dL — ABNORMAL HIGH (ref 70–99)
Potassium: 4 mmol/L (ref 3.5–5.2)
Sodium: 137 mmol/L (ref 134–144)
eGFR: 102 mL/min/{1.73_m2} (ref >59)

## 2022-02-08 LAB — BRAIN NATRIURETIC PEPTIDE: BNP: 26.3 pg/mL (ref 0.0–100.0)

## 2022-02-10 ENCOUNTER — Telehealth: Payer: Self-pay | Admitting: Pulmonary Disease

## 2022-02-10 ENCOUNTER — Ambulatory Visit (INDEPENDENT_AMBULATORY_CARE_PROVIDER_SITE_OTHER): Payer: Commercial Managed Care - HMO | Admitting: Pulmonary Disease

## 2022-02-10 ENCOUNTER — Encounter: Payer: Self-pay | Admitting: Pulmonary Disease

## 2022-02-10 ENCOUNTER — Ambulatory Visit (INDEPENDENT_AMBULATORY_CARE_PROVIDER_SITE_OTHER): Payer: Commercial Managed Care - HMO

## 2022-02-10 VITALS — BP 124/70 | HR 76 | Wt 230.2 lb

## 2022-02-10 DIAGNOSIS — R0902 Hypoxemia: Secondary | ICD-10-CM | POA: Diagnosis not present

## 2022-02-10 DIAGNOSIS — J1282 Pneumonia due to coronavirus disease 2019: Secondary | ICD-10-CM

## 2022-02-10 DIAGNOSIS — U071 COVID-19: Secondary | ICD-10-CM | POA: Diagnosis not present

## 2022-02-10 MED ORDER — MOXIFLOXACIN HCL 400 MG PO TABS: 400.0000 mg | ORAL_TABLET | Freq: Every day | ORAL | 0 refills | Status: AC

## 2022-02-10 MED ORDER — MOXIFLOXACIN HCL 400 MG PO TABS: 400.0000 mg | ORAL_TABLET | Freq: Every day | ORAL | 0 refills | Status: DC

## 2022-02-10 NOTE — Telephone Encounter (Signed)
Called and left voicemail for Lindsay at [facility] that I sent in ABT to a different pharmacy and confirmed it with patient. Nothing further needed

## 2022-02-10 NOTE — Addendum Note (Signed)
Addended by: Monna Fam L on: 02/10/2022 12:06 PM   Modules accepted: Orders

## 2022-02-10 NOTE — Telephone Encounter (Signed)
Called pt to inform her about CT appt at [facility]. She has some questions regarding the CT and requested to speak with a nurse as soon as possible. Thanks

## 2022-02-10 NOTE — Patient Instructions (Addendum)
Nice to meet you  Check your oxygen and the goal is 88%. If you are 88% or higher you can try turning down the oxygen. If you stay 88% or higher without the oxygen then you can stay off of it.  We will repeat a chest xray today  We will schedule a CT scan of the chest for next month   We will check an overnight oximetry to make sure oxygen is ok at night - you can wear your CPAP with this. This will need to be scheduled  Take moxifloxacin for possible sinus infection  We can consider a different inhaler if symptoms continue to not improve  Return to clinic in 2 months or sooner as needed

## 2022-02-10 NOTE — Progress Notes (Signed)
@Patient  ID: Alejandra [name], female    DOB: 1961/03/30, 61 y.o.   MRN: [id number]  Chief Complaint  Patient presents with   Consult    Consult for PNA r/t covid. Pt states that since she got out of the hospital nobody has been controlling her oxygen. Pt states that she got out of hospital in August. Pt was on levaquin for 10 days and she states she feel like the PNA has not gone away.     Referring provider: Ledora Bottcher, PA  HPI:   61 y.o. woman whom we are seeing in consultation for evaluation of hypoxemic respiratory failure.  Note from referring provider reviewed.  Most recent cardiology note x2 reviewed.  Patient was at the [facility] 10/2021.  Developed what sounds like possible sinus infection, sinus congestion, malaise.  Not much issue with this.  She developed cough and dyspnea subsequently after returning home and about 7 to 10 days after initial symptoms.  She was found to be hypoxemic in the ED.  Chest x-ray reveals bilateral multifocal pneumonia with fairly dense infiltrates left lateral lung on my review interpretation.  This on 11/27/2021.  Most recent chest x-ray 12/16/2021 after discharge reveals similar appearing x-ray, largely unchanged on my review and interpretation.  CT scan PE protocol 12/03/2021 on my review and interpretation reveals no PE, diffuse anterior and upper lobe predominant groundglass opacities with more confluent and denser opacities scattered throughout consistent with multifocal pneumonia and consistent with COVID-pneumonia on my review and interpretation.  She was treated with antibiotics and steroids.  Gradually oxygen improved.  Was discharged on 3 to 4 L nasal cannula.  She had worsening symptoms a couple weeks ago.  Was placed on Levaquin.  With improvement.  Gradually it sounds like oxygen saturation has improved.  She is staying in the mid 90s on room air.  She reports recently spending 6 hours of oxygen with exertion, shopping around town, checking her  oxygen saturations staying low to mid 90s.  She has nasal congestion.  Thick mucus.  Worsened starting oxygen.  Suspect drying out mucous membranes.  Other she has a history of seasonal allergies and sinus issues.  She has OSA.  She is not using CPAP currently she is on her oxygen.  PMH: Hypertension, diabetes, hyperlipidemia, seasonal allergies, sleep apnea on CPAP Surgical history: Cholecystectomy, hysterectomy Family history: First relatives with allergies Social history: Former smoker, quit 2005, lives in [place] / Pulmonary Flowsheets:   ACT:      No data to display          MMRC:     No data to display          Epworth:      No data to display          Tests:   FENO:  No results found for: "NITRICOXIDE"  PFT:     No data to display          WALK:      No data to display          Imaging: Personally reviewed and as per EMR discussion this note No results found.  Lab Results: Personally reviewed CBC    Component Value Date/Time   WBC 14.7 (H) 12/06/2021 0853   RBC 4.43 12/06/2021 0853   HGB 13.7 12/06/2021 0853   HCT 41.0 12/06/2021 0853   PLT 233 12/06/2021 0853   MCV 92.6 12/06/2021 0853   MCH 30.9 12/06/2021 0853  MCHC 33.4 12/06/2021 0853   RDW 13.3 12/06/2021 0853   LYMPHSABS 2.6 12/06/2021 0853   MONOABS 0.1 12/06/2021 0853   EOSABS 0.0 12/06/2021 0853   BASOSABS 0.0 12/06/2021 0853    BMET    Component Value Date/Time   NA 137 02/07/2022 1300   K 4.0 02/07/2022 1300   CL 97 02/07/2022 1300   CO2 23 02/07/2022 1300   GLUCOSE 172 (H) 02/07/2022 1300   GLUCOSE 110 (H) 12/06/2021 1120   BUN 12 02/07/2022 1300   CREATININE 0.58 02/07/2022 1300   CALCIUM 9.6 02/07/2022 1300   GFRNONAA >60 12/06/2021 1120   GFRAA >90 01/08/2012 1030    BNP    Component Value Date/Time   BNP 26.3 02/07/2022 1300   BNP 62.3 12/04/2021 0309    ProBNP No results found for: "PROBNP"  Specialty Problems        Pulmonary Problems   Pneumonia due to COVID-19 virus   Acute respiratory failure with hypoxia (HCC)    Allergies  Allergen Reactions   Atorvastatin Other (See Comments)    SOB   Bactrim [Sulfamethoxazole-Trimethoprim] Other (See Comments)    Mouth will be like metal   Celebrex [Celecoxib] Diarrhea   Codeine Nausea And Vomiting   Dilaudid [Hydromorphone Hcl] Nausea And Vomiting   Dilaudid [Hydromorphone]     Other reaction(s): Not available   Hydrochlorothiazide Other (See Comments)    CANNOT VOID   Meloxicam Other (See Comments)    FLU LIKE SYMPTOMS   Morphine And Related Nausea And Vomiting   Tape     Other reaction(s): Not available   Tramadol     unknown   Wound Dressing Adhesive    Amoxicillin-Pot Clavulanate Diarrhea   Other Hives and Rash    emysin   Wellbutrin [Bupropion] Swelling and Rash    Immunization History  Administered Date(s) Administered   Influenza Split 02/14/2018   Pneumococcal Polysaccharide-23 07/09/2019    Past Medical History:  Diagnosis Date   Arthritis    Diabetes (HCC)     Tobacco History: Social History   Tobacco Use  Smoking Status Former  Smokeless Tobacco Never   Counseling given: Not Answered   Continue to not smoke  Outpatient Encounter Medications as of 02/10/2022  Medication Sig   albuterol (VENTOLIN HFA) 108 (90 Base) MCG/ACT inhaler Inhale 2 puffs into the lungs every 6 (six) hours as needed for wheezing or shortness of breath.   ALPRAZolam (XANAX) 0.5 MG tablet Take 0.5 mg by mouth 3 (three) times daily as needed.   aspirin EC 81 MG tablet Take 1 tablet (81 mg total) by mouth daily. Swallow whole.   benzonatate (TESSALON PERLES) 100 MG capsule Take 2 capsules (200 mg total) by mouth 3 (three) times daily as needed for cough.   cetirizine (ZYRTEC) 10 MG chewable tablet Chew 10 mg by mouth daily.   glimepiride (AMARYL) 4 MG tablet Take 4 mg by mouth daily.   losartan (COZAAR) 50 MG tablet    misoprostol  (CYTOTEC) 100 MCG tablet Take 100 mcg by mouth daily as needed (For Inflammation per spouse).   moxifloxacin (AVELOX) 400 MG tablet Take 1 tablet (400 mg total) by mouth daily at 8 pm for 7 days.   nitrofurantoin, macrocrystal-monohydrate, (MACROBID) 100 MG capsule Take 100 mg by mouth daily.   oxymetazoline (AFRIN) 0.05 % nasal spray Place 1 spray into both nostrils 2 (two) times daily.   potassium chloride (KLOR-CON) 10 MEQ tablet Take 1 tablet (10 mEq total)  by mouth daily.   torsemide (DEMADEX) 20 MG tablet Take 1 tablet (20 mg total) by mouth daily.   TRULICITY 1.5 MG/0.5ML SOPN Inject 1.5 mg into the skin daily.   verapamil (CALAN-SR) 120 MG CR tablet    No facility-administered encounter medications on file as of 02/10/2022.     Review of Systems  Review of Systems  No chest pain with exertion.  No orthopnea or PND.  Comprehensive review of systems otherwise negative. Physical Exam  BP 124/70 (BP Location: Left Arm, Patient Position: Sitting, Cuff Size: Normal)   Pulse 76   Wt 230 lb 3.2 oz (104.4 kg)   SpO2 96%   BMI 42.10 kg/m   Wt Readings from Last 5 Encounters:  02/10/22 230 lb 3.2 oz (104.4 kg)  02/07/22 229 lb 12.8 oz (104.2 kg)  12/30/21 226 lb 6.6 oz (102.7 kg)  12/04/21 233 lb 11 oz (106 kg)    BMI Readings from Last 5 Encounters:  02/10/22 42.10 kg/m  02/07/22 42.03 kg/m  12/30/21 41.41 kg/m  12/04/21 42.74 kg/m     Physical Exam General: Sitting in chair, no acute distress Eyes: EOMI, icterus Neck: Supple, no JVP Pulmonary: Clear, distant Cardiovascular: Warm, no edema Abdomen: Nondistended, sounds present MSK: No synovitis, no joint effusion Neuro: Normal gait, no weakness Psych: Normal mood, full affect   Assessment & Plan:   Acute hypoxemic respiratory failure: In the setting of pneumonia and COVID-19.  Pattern looks consistent with COVID-19 pneumonia.  Sounds like gradually improving.  Oxygen saturation at rest today is 94%.  She  reports recent 6 hours off oxygen around town with oxygen as needed to mid 90s.  She was encouraged to continue to take her oxygen at rest and with exertion.  Goal O2 sat 88% or greater.  If staying above 88% she can gradually decrease oxygen or stays above 88% off oxygen she can stay off the oxygen.  She was counseled importance of ongoing oxygen monitoring at home via her pulse oximeter.  COVID-19 pneumonia: Repeat chest x-ray today.  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  Given her slow recovery I do fear there may be some element of fibrosis but fortunately her hypoxemia seems to be greatly improving.  Nasal congestion: Suspect related to oxygen therapy, dehydration/dry mouth mucous membranes.  She is not using her humidified oxygen at home given sensation of not helping.  Hopefully with increased time on oxygen this will improve.  Encouraged sinus rinses.  Moxifloxacin for 7 days.  Although again suspicion is this is not infectious.  OSA: On CPAP.  Intermittently adherent.  We will check overnight symmetry on CPAP, room air given recent hypoxemia.  If low will need an in lab titration study.   Return in about 2 months (around 04/12/2022).   Karren Burly, MD 02/10/2022   This appointment required 85 minutes of patient care (this includes precharting, chart review, review of results, face-to-face care, etc.).

## 2022-02-10 NOTE — Telephone Encounter (Signed)
I spent a lot of time explaining plan. Can you please reach out and see what we can do to help?

## 2022-02-10 NOTE — Telephone Encounter (Signed)
Called and spoke to patient about her questions. She was just wondering if the CT machine was inclosed or open. I advised her that it was open. Nothing further needed

## 2022-02-11 ENCOUNTER — Encounter: Payer: Self-pay | Admitting: *Deleted

## 2022-02-11 NOTE — Progress Notes (Signed)
Chest xray is improving which is good news.

## 2022-03-01 ENCOUNTER — Telehealth: Payer: Self-pay | Admitting: Pulmonary Disease

## 2022-03-01 MED ORDER — FLUTICASONE-SALMETEROL 250-50 MCG/ACT IN AEPB: 1.0000 | INHALATION_SPRAY | Freq: Two times a day (BID) | RESPIRATORY_TRACT | 2 refills | Status: DC

## 2022-03-01 NOTE — Telephone Encounter (Signed)
Called and spoke to patient and went over new inhaler with her. No questions noted from her. Nothing further needed

## 2022-03-01 NOTE — Telephone Encounter (Signed)
Sent new prescription for advair diskus 1 puff twice daily. Rinse mouth after every use. Please schedule appt with me in 4 weeks.

## 2022-03-01 NOTE — Telephone Encounter (Signed)
Reviewed overnight oximetry.  Oxygen saturation 88% and above for the entirety of test with the exception of 7 seconds at 86%.  Majority of time oxygen saturation is in the mid 90s.  Based on this, do not recommend oxygen supplementation.  Recommend continuing CPAP therapy.

## 2022-03-01 NOTE — Telephone Encounter (Signed)
Called patient and informed her of the ONO results. While on the phone patient stated due to the weather changing she is feeling really heavy chested and her albuterol is not helping much. She is wanting a different inhaler called in if possible.   Please advise sir  PS she states her oxygen has been 98-99%.

## 2022-03-07 ENCOUNTER — Telehealth: Payer: Self-pay | Admitting: Pulmonary Disease

## 2022-03-07 MED ORDER — BENZONATATE 200 MG PO CAPS: 200.0000 mg | ORAL_CAPSULE | Freq: Three times a day (TID) | ORAL | 1 refills | Status: AC | PRN

## 2022-03-07 MED ORDER — BUDESONIDE-FORMOTEROL FUMARATE 160-4.5 MCG/ACT IN AERO: 2.0000 | INHALATION_SPRAY | Freq: Two times a day (BID) | RESPIRATORY_TRACT | 6 refills | Status: DC

## 2022-03-07 NOTE — Telephone Encounter (Signed)
Pt said she was prescribed Advair by Dr. Silas Flood and said that after taking the first dose, she started coughing and said that her cough has really been bothering her.  States that she has been using mucinex cough and congestion which is not touching it. Pt said that she has tessalon but said she has not been taking it as she is almost out of that Rx.  Pt has been taking two of the 100mg  tessalon and due to this is why she is running out of the Rx quicker.  Pt said that her tongue also feels raw.  Due to this, pt wants to know what might be recommended as she stated she was told to call the office if she had any problems from the inhaler.  Dr. Silas Flood, please advise.

## 2022-03-07 NOTE — Telephone Encounter (Signed)
Please send prescription for Symbicort 160 dose 2 puff twice daily.  Okay to refill the [employer] prescription.  Please stop Advair discus.  Sometimes the powder makes things worse.  We may need to do a prior authorization for the Symbicort.

## 2022-03-07 NOTE — Telephone Encounter (Signed)
Spoke with pt and reviewed Dr. Silas Flood advise and medication instructions. Pt stated understanding and verified pharmacy as [facility].   Pharmacy can you please assist with Symbicort PA if needed.

## 2022-03-14 ENCOUNTER — Ambulatory Visit
Admission: RE | Admit: 2022-03-14 | Discharge: 2022-03-14 | Disposition: A | Discharge status: Home / Self Care | Payer: Commercial Managed Care - HMO | Source: Ambulatory Visit | Attending: Pulmonary Disease | Admitting: Pulmonary Disease

## 2022-03-14 DIAGNOSIS — U071 COVID-19: Secondary | ICD-10-CM

## 2022-03-15 ENCOUNTER — Telehealth: Payer: Self-pay | Admitting: Pulmonary Disease

## 2022-03-15 MED ORDER — ALBUTEROL SULFATE HFA 108 (90 BASE) MCG/ACT IN AERS: 2.0000 | INHALATION_SPRAY | Freq: Four times a day (QID) | RESPIRATORY_TRACT | 11 refills | Status: AC | PRN

## 2022-03-15 MED ORDER — PREDNISONE 20 MG PO TABS: 20.0000 mg | ORAL_TABLET | Freq: Every day | ORAL | 0 refills | Status: AC

## 2022-03-15 NOTE — Telephone Encounter (Signed)
Contacted the patient via phone to discuss CT high-resolution results.  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  I discussed this with her.  Plan to repeat scan in 6 months to reevaluate.  She reports worsening cough over the last few weeks.  Got azithromycin with mild improvement.  Sent prednisone 20 mg daily for 5 days.  Also refilled albuterol.  Has upcoming follow-up with me 04/04/2022.

## 2022-04-04 ENCOUNTER — Ambulatory Visit (INDEPENDENT_AMBULATORY_CARE_PROVIDER_SITE_OTHER): Payer: Commercial Managed Care - HMO | Admitting: Pulmonary Disease

## 2022-04-04 ENCOUNTER — Encounter: Payer: Self-pay | Admitting: Pulmonary Disease

## 2022-04-04 VITALS — BP 126/72 | HR 77 | Temp 98.9°F | Wt 229.4 lb

## 2022-04-04 DIAGNOSIS — U071 COVID-19: Secondary | ICD-10-CM

## 2022-04-04 DIAGNOSIS — J1282 Pneumonia due to coronavirus disease 2019: Secondary | ICD-10-CM

## 2022-04-04 DIAGNOSIS — R0609 Other forms of dyspnea: Secondary | ICD-10-CM | POA: Diagnosis not present

## 2022-04-04 DIAGNOSIS — G4733 Obstructive sleep apnea (adult) (pediatric): Secondary | ICD-10-CM | POA: Diagnosis not present

## 2022-04-04 NOTE — Progress Notes (Signed)
 @Patient  ID: Alejandra [name], female    DOB: [birth year], 61 y.o.   MRN: [id number]  Chief Complaint  Patient presents with   Follow-up    Pt is here for follow up for PNA due to Covid. Pt states she did have a resp infection and was given ABT and prednisone. Pt states she turned in oxugen at end on October and we need to fill out form. Insurance refused to fill symbicort. Pain is still noted in chest when she breaths deep.     Referring provider: Clovis RileyMitchell, L.August Saucerean, MD  HPI:   61 y.o. woman whom we are seeing in follow-up for evaluation of hypoxemic respiratory failure and DOE following covid infection.  Hypoxemia has resolved.  Multiple telephone encounters in interim since last visit reviewed..  Overall, doing okay.  Symptoms gradually improving with time.  Recent upper respiratory infection, chest congestion, bronchitis.  Lasting 2 and half to 3 weeks.  Slowly getting better but persistent.  Discussed results of CT high-resolution.  On my review and interpretation shows scattered patchy bilateral upper lobe and right lower lobe and small area left lower lobe of developing fibrosis with mild residual groundglass suggestive of resolving and fibrosing postinflammatory fibrosis/scar.  She tried Advair Diskus.  This caused mouth sores and throat irritation.  She stopped.  Prescribed Symbicort.  She did not pick this up it is too expensive.  This the first I am hearing of it.  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  Hopefully with ongoing healing some of the groundglass will return to normal lung as opposed to total scar.  Symptoms gradually improving.  Did show some air trapping/mosaicism.  Role and rationale for inhalers discussed with her today.  She is okay to try as long as side effects are minimal.   HPI at initial visit: [facility]  Developed what sounds like possible sinus infection, sinus congestion, malaise.  Not much  issue with this.  She developed cough and dyspnea subsequently after returning home and about 7 to 10 days after initial symptoms.  She was found to be hypoxemic in the ED.  Chest x-ray reveals bilateral multifocal pneumonia with fairly dense infiltrates left lateral lung on my review interpretation.  This on 11/27/2021.  Most recent chest x-ray 12/16/2021 after discharge reveals similar appearing x-ray, largely unchanged on my review and interpretation.  CT scan PE protocol 12/03/2021 on my review and interpretation reveals no PE, diffuse anterior and upper lobe predominant groundglass opacities with more confluent and denser opacities scattered throughout consistent with multifocal pneumonia and consistent with COVID-pneumonia on my review and interpretation.  She was treated with antibiotics and steroids.  Gradually oxygen improved.  Was discharged on 3 to 4 L nasal cannula.  She had worsening symptoms a couple weeks ago.  Was placed on Levaquin.  With improvement.  Gradually it sounds like oxygen saturation has improved.  She is staying in the mid 90s on room air.  She reports recently spending 6 hours of oxygen with exertion, shopping around town, checking her oxygen saturations staying low to mid 90s.  She has nasal congestion.  Thick mucus.  Worsened starting oxygen.  Suspect drying out mucous membranes.  Other she has a history of seasonal allergies and sinus issues.  She has OSA.  She is not using CPAP currently she is on her oxygen.  PMH: Hypertension, diabetes, hyperlipidemia, seasonal allergies, sleep apnea on CPAP Surgical history: Cholecystectomy,  hysterectomy Family history: First relatives with allergies Social history: Former smoker, quit 2005, lives in [place] / Pulmonary Flowsheets:   ACT:      No data to display           MMRC:     No data to display           Epworth:      No data to display           Tests:   FENO:  No results found  for: "NITRICOXIDE"  PFT:     No data to display           WALK:      No data to display           Imaging: Personally reviewed and as per EMR discussion this note CT Chest High Resolution  Result Date: 03/15/2022 CLINICAL DATA:  Follow-up COVID pneumonia.  Former smoker. EXAM: CT CHEST WITHOUT CONTRAST TECHNIQUE: Multidetector CT imaging of the chest was performed following the standard protocol without intravenous contrast. High resolution imaging of the lungs, as well as inspiratory and expiratory imaging, was performed. RADIATION DOSE REDUCTION: This exam was performed according to the departmental dose-optimization program which includes automated exposure control, adjustment of the mA and/or kV according to patient size and/or use of iterative reconstruction technique. COMPARISON:  12/03/2021 chest CT angiogram. FINDINGS: Cardiovascular: Normal heart size. No significant pericardial effusion/thickening. Three-vessel coronary atherosclerosis. Atherosclerotic nonaneurysmal thoracic aorta. Normal caliber pulmonary arteries. Mediastinum/Nodes: No significant thyroid nodules. Unremarkable esophagus. Top-normal size axillary, mediastinal and hilar nodes without pathologically enlarged nodes. Lungs/Pleura: No pneumothorax. No pleural effusion. Previously visualized extensive patchy regions of consolidation and ground-glass opacity throughout both lungs on 12/03/2021 chest CT study have substantially decreased, with several residual mildly thickened curvilinear parenchymal bands at the previous areas of consolidation, most prominent in the left upper lobe. Solitary residual indistinct 1.5 x 0.9 cm nodular focus of consolidation in the posterior right lower lobe (series 7/image 62), significantly decreased from 3.6 x 2.1 cm. Residual mild patchy ground-glass opacity in both lungs. No acute interval sites of consolidative airspace disease. New scattered mild regions of cylindrical bronchiectasis  in both lungs, most prominent in the left greater than right upper lobes and medial right middle lobe. No significant regions of subpleural reticulation. No frank honeycombing. Mild to moderate patchy air trapping in both lungs on the expiration sequence. Partial tracheal lumen collapse on expiratory imaging without frank tracheobronchomalacia. Upper abdomen: Small hiatal hernia. Diffuse hepatic steatosis. Cholecystectomy. Musculoskeletal: No aggressive appearing focal osseous lesions. Mild thoracic spondylosis. IMPRESSION: 1. Spectrum of findings suggestive of evolving mild postinflammatory fibrosis with mild bronchiectasis. 2. Mild to moderate patchy air trapping in both lungs, indicative of small airways disease. 3. Residual small solitary nodular focus of consolidation in the posterior right lower lobe, significantly decreased, favor resolving infection and evolving nodular scarring. 4. Recommend attention to these findings on follow-up high-resolution chest CT in 3-6 months. 5. Three-vessel coronary atherosclerosis. 6. Small hiatal hernia. 7. Diffuse hepatic steatosis. 8.  Aortic Atherosclerosis (ICD10-I70.0). Electronically Signed   By: Delbert Phenix M.D.   On: 03/15/2022 15:27    Lab Results: Personally reviewed CBC    Component Value Date/Time   WBC 14.7 (H) 12/06/2021 0853   RBC 4.43 12/06/2021 0853   HGB 13.7 12/06/2021 0853   HCT 41.0 12/06/2021 0853   PLT 233 12/06/2021 0853   MCV 92.6 12/06/2021 0853   MCH 30.9 12/06/2021 0853   MCHC  33.4 12/06/2021 0853   RDW 13.3 12/06/2021 0853   LYMPHSABS 2.6 12/06/2021 0853   MONOABS 0.1 12/06/2021 0853   EOSABS 0.0 12/06/2021 0853   BASOSABS 0.0 12/06/2021 0853    BMET    Component Value Date/Time   NA 137 02/07/2022 1300   K 4.0 02/07/2022 1300   CL 97 02/07/2022 1300   CO2 23 02/07/2022 1300   GLUCOSE 172 (H) 02/07/2022 1300   GLUCOSE 110 (H) 12/06/2021 1120   BUN 12 02/07/2022 1300   CREATININE 0.58 02/07/2022 1300   CALCIUM 9.6  02/07/2022 1300   GFRNONAA >60 12/06/2021 1120   GFRAA >90 01/08/2012 1030    BNP    Component Value Date/Time   BNP 26.3 02/07/2022 1300   BNP 62.3 12/04/2021 0309    ProBNP No results found for: "PROBNP"  Specialty Problems       Pulmonary Problems   Pneumonia due to COVID-19 virus   Acute respiratory failure with hypoxia (HCC)    Allergies  Allergen Reactions   Atorvastatin Other (See Comments)    SOB   Bactrim [Sulfamethoxazole-Trimethoprim] Other (See Comments)    Mouth will be like metal   Celebrex [Celecoxib] Diarrhea   Codeine Nausea And Vomiting   Dilaudid [Hydromorphone Hcl] Nausea And Vomiting   Dilaudid [Hydromorphone]     Other reaction(s): Not available   Hydrochlorothiazide Other (See Comments)    CANNOT VOID   Meloxicam Other (See Comments)    FLU LIKE SYMPTOMS   Morphine And Related Nausea And Vomiting   Tape     Other reaction(s): Not available   Tramadol     unknown   Wound Dressing Adhesive    Amoxicillin-Pot Clavulanate Diarrhea   Other Hives and Rash    emysin   Wellbutrin [Bupropion] Swelling and Rash    Immunization History  Administered Date(s) Administered   Influenza Split 02/14/2018   Pneumococcal Polysaccharide-23 07/09/2019    Past Medical History:  Diagnosis Date   Arthritis    Diabetes (HCC)     Tobacco History: Social History   Tobacco Use  Smoking Status Former  Smokeless Tobacco Never   Counseling given: Not Answered   Continue to not smoke  Outpatient Encounter Medications as of 04/04/2022  Medication Sig   albuterol (VENTOLIN HFA) 108 (90 Base) MCG/ACT inhaler Inhale 2 puffs into the lungs every 6 (six) hours as needed for wheezing or shortness of breath.   ALPRAZolam (XANAX) 0.5 MG tablet Take 0.5 mg by mouth 3 (three) times daily as needed.   aspirin EC 81 MG tablet Take 1 tablet (81 mg total) by mouth daily. Swallow whole.   benzonatate (TESSALON) 200 MG capsule Take 1 capsule (200 mg total) by mouth  3 (three) times daily as needed for cough.   cetirizine (ZYRTEC) 10 MG chewable tablet Chew 10 mg by mouth daily.   glimepiride (AMARYL) 4 MG tablet Take 4 mg by mouth daily.   losartan (COZAAR) 50 MG tablet    misoprostol (CYTOTEC) 100 MCG tablet Take 100 mcg by mouth daily as needed (For Inflammation per spouse).   nitrofurantoin, macrocrystal-monohydrate, (MACROBID) 100 MG capsule Take 100 mg by mouth daily.   oxymetazoline (AFRIN) 0.05 % nasal spray Place 1 spray into both nostrils 2 (two) times daily.   potassium chloride (KLOR-CON) 10 MEQ tablet Take 1 tablet (10 mEq total) by mouth daily.   TRULICITY 1.5 MG/0.5ML SOPN Inject 1.5 mg into the skin daily.   verapamil (CALAN-SR) 120 MG CR tablet    [  DISCONTINUED] torsemide (DEMADEX) 20 MG tablet Take 1 tablet (20 mg total) by mouth daily.   budesonide-formoterol (SYMBICORT) 160-4.5 MCG/ACT inhaler Inhale 2 puffs into the lungs 2 (two) times daily. (Patient not taking: Reported on 04/04/2022)   No facility-administered encounter medications on file as of 04/04/2022.     Review of Systems  Review of Systems  N/a Physical Exam  BP 126/72 (BP Location: Left Arm, Patient Position: Sitting, Cuff Size: Normal)   Pulse 77   Temp 98.9 F (37.2 C) (Oral)   Wt 229 lb 6.4 oz (104.1 kg)   SpO2 99%   BMI 41.96 kg/m   Wt Readings from Last 5 Encounters:  04/04/22 229 lb 6.4 oz (104.1 kg)  02/10/22 230 lb 3.2 oz (104.4 kg)  02/07/22 229 lb 12.8 oz (104.2 kg)  12/30/21 226 lb 6.6 oz (102.7 kg)  12/04/21 233 lb 11 oz (106 kg)    BMI Readings from Last 5 Encounters:  04/04/22 41.96 kg/m  02/10/22 42.10 kg/m  02/07/22 42.03 kg/m  12/30/21 41.41 kg/m  12/04/21 42.74 kg/m     Physical Exam General: Sitting in chair, no acute distress Eyes: EOMI, icterus Neck: Supple, no JVP Pulmonary: Clear, distant Cardiovascular: Warm, no edema Abdomen: Nondistended, sounds present MSK: No synovitis, no joint effusion Neuro: Normal gait, no  weakness Psych: Normal mood, full affect   Assessment & Plan:   Acute hypoxemic respiratory failure, resolved: In the setting of pneumonia and COVID-19.  Pattern looks consistent with COVID-19 pneumonia.  Sounds like gradually improving.  Oxygen saturation at rest today is 99%, improved from prior.  She reports serial monitoring of oxygen at home and oxygen remained above 88%, usually mid 90s with exertion and at rest.  Overnight oximetry test performed in interim reviewed, no need for oxygen.  Oxygen can be discontinued.  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   DOE: Likely related to postinfectious scarring after pneumonia.  Possible asthma.  Trial of ICS/LABA therapy.  If not improving, will plan to refer to cardiology given three-vessel coronary atherosclerosis.  OSA: On CPAP.  Advised to find a mask that fits better.  And wear for at least 30 days.  At next visit she should bring her SD card and we can review how well treated.  May need to consider repeat polysomnography in the future   Return in about 3 months (around 07/04/2022).   Karren Burly, MD 04/04/2022   This appointment required 45 minutes of patient care (this includes precharting, chart review, review of results, face-to-face care, etc.).

## 2022-04-04 NOTE — Patient Instructions (Signed)
Nice to see you again  I will send a message to our pharmacy colleagues regarding inhaler options, I am sorry the Symbicort so expensive  I think is worth trying to see if it helps your shortness of breath.  If it does not, we do not need to continue it.  I would use at least for 30 days.  Please let me know if it helps or not.  I will plan to see you back in about 3 months.  Please bring your SD card from your CPAP machine so we can look at the data.  I hope a different mask will help you tolerate it better.  Return to clinic in 3 months or sooner as needed with Dr. Judeth Horn

## 2022-06-09 NOTE — Progress Notes (Unsigned)
Cardiology Office Note:    Date:  06/09/2022   ID:  Alejandra [name], DOB 01-06-1961, MRN [id number]  PCP:  Aurea Graff.Marlou Sa, [place] Providers Cardiologist:  Minus Breeding, MD { Click to update primary MD,subspecialty MD or APP then REFRESH:1}    Referring MD: Alroy Dust, L.Marlou Sa, MD   No chief complaint on file. ***  History of Present Illness:    Alejandra [name] is a 62 y.o. female with a hx of DM, arthritis, obesity, OSA not compliant on CPAP, and HFpEF.  She has no prior cardiac history and was evaluated by her team during hospitalization on 11/29/2021.  She was diagnosed with COVID-19 and was quarantining.  However 4 days later she developed AMS and was brought to the ER.  Due to elevated troponin to 2836, cardiology was consulted.  Echocardiogram showed LVEF 55 to 60% and normal RV size and function with no regional wall motion abnormality.  Cardiology recommended medical treatment for suspected demand ischemia.  VQ scan was indeterminate for PE.  She was treated with IV heparin and discharged on aspirin.  Required BiPAP treatment and was treated for multifocal pneumonia as well as COVID-19 infection, septic shock with an elevated lactic acid and procalcitonin greater than 150, and toxic/metabolic encephalopathy.  She required pressor support on admission with Levophed and vasopressin.  She completed remdesivir treatment, steroids, and antibiotics.  She was diuresed with Lasix for hypervolemia and significant orthopnea.  Troponin did trend down prior to discharge.  Cardiology recommended possible ischemic evaluation once her pulmonary issues were improved.  I saw her 12/30/21 and she was being treated for PNA, still on supplemental O2. She was very upset at the way she was treated during her hospitalization at [facility].   She saw Dr. Percival Spanish in follow-up on 02/07/2022 and was still reporting shortness of breath.  She was self weaning her oxygen.  BNP was WNL.  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  He felt elevated troponin was due to demand ischemia. Also mentioned a short trial of 20 mg demadex with 10 mEq K to see if this improves breathing.   She presents back today for follow up.      Shortness of breath Post infectious fibrosis, following with pulmonology BNP WNL   COVID-19 infection, septic shock, bacterial pneumonia Acute on chronic respiratory failure with hypoxia COVID-19 on 11/26/2021 Discharged on 12/06/2021 She remains on O2 at home1   HFpEF Echocardiogram during her hospitalization showed an LVEF of 55-60% and normal diastolic function.  RV function was normal, mild to moderate MR was noted.    Elevated troponin No reports of chest pain EKG remains nonischemic Troponin did downtrend prior to discharge Placed on aspirin   Hypertension Continued on verapamil, losartan   MR - mild Repeat echo in 1 year    Past Medical History:  Diagnosis Date   Arthritis    Diabetes ([place])     No past surgical history on file.  Current Medications: No outpatient medications have been marked as taking for the 06/14/22 encounter (Appointment) with Ledora Bottcher, [place].     Allergies:   Atorvastatin, Bactrim [sulfamethoxazole-trimethoprim], Celebrex [celecoxib], Codeine, Dilaudid [hydromorphone hcl], Dilaudid [hydromorphone], Hydrochlorothiazide, Meloxicam, Morphine and related, Tape, Tramadol, Wound dressing adhesive, Amoxicillin-pot clavulanate, Other, and Wellbutrin [bupropion]   Social History   Socioeconomic History   Marital status: Married    Spouse name: Not on file   Number  of children: Not on file   Years of education: Not on file   Highest education level: Not on file  Occupational History   Not on file  Tobacco Use   Smoking status: Former   Smokeless tobacco: Never  Substance and Sexual Activity   Alcohol use: No   Drug use: No   Sexual activity: Not on file   Other Topics Concern   Not on file  Social History Narrative   Not on file   Social Determinants of Health   Financial Resource Strain: Not on file  Food Insecurity: Not on file  Transportation Needs: Not on file  Physical Activity: Not on file  Stress: Not on file  Social Connections: Not on file     Family History: The patient's ***family history includes Hypertension in her father and mother.  ROS:   Please see the history of present illness.    *** All other systems reviewed and are negative.  EKGs/Labs/Other Studies Reviewed:    The following studies were reviewed today: ***  EKG:  EKG is *** ordered today.  The ekg ordered today demonstrates ***  Recent Labs: 12/06/2021: ALT 20; Hemoglobin 13.7; Magnesium 1.9; Platelets 233 02/07/2022: BNP 26.3; BUN 12; Creatinine, Ser 0.58; Potassium 4.0; Sodium 137  Recent Lipid Panel    Component Value Date/Time   CHOL 73 11/30/2021 0323   TRIG 81 11/30/2021 0323   HDL 25 (L) 11/30/2021 0323   CHOLHDL 2.9 11/30/2021 0323   VLDL 16 11/30/2021 0323   LDLCALC 32 11/30/2021 0323     Risk Assessment/Calculations:   {Does this patient have ATRIAL FIBRILLATION?:[id number]}  No BP recorded.  {Refresh Note OR Click here to enter BP  :1}***         Physical Exam:    VS:  There were no vitals taken for this visit.    Wt Readings from Last 3 Encounters:  04/04/22 229 lb 6.4 oz (104.1 kg)  02/10/22 230 lb 3.2 oz (104.4 kg)  02/07/22 229 lb 12.8 oz (104.2 kg)     GEN: *** Well nourished, well developed in no acute distress HEENT: Normal NECK: No JVD; No carotid bruits LYMPHATICS: No lymphadenopathy CARDIAC: ***RRR, no murmurs, rubs, gallops RESPIRATORY:  Clear to auscultation without rales, wheezing or rhonchi  ABDOMEN: Soft, non-tender, non-distended MUSCULOSKELETAL:  No edema; No deformity  SKIN: Warm and dry NEUROLOGIC:  Alert and oriented x 3 PSYCHIATRIC:  Normal affect   ASSESSMENT:    No diagnosis  found. PLAN:    In order of problems listed above:  ***      {Are you ordering a CV Procedure (e.g. stress test, cath, DCCV, TEE, etc)?   Press F2        :[id number]    Medication Adjustments/Labs and Tests Ordered: Current medicines are reviewed at length with the patient today.  Concerns regarding medicines are outlined above.  No orders of the defined types were placed in this encounter.  No orders of the defined types were placed in this encounter.   There are no Patient Instructions on file for this visit.   Signed, Ledora Bottcher, [place]  06/09/2022 5:04 PM    [place]

## 2022-06-14 ENCOUNTER — Ambulatory Visit: Payer: Commercial Managed Care - HMO | Attending: Physician Assistant | Admitting: Physician Assistant

## 2022-06-14 ENCOUNTER — Encounter: Payer: Self-pay | Admitting: Physician Assistant

## 2022-06-14 VITALS — BP 132/86 | HR 85 | Ht 62.0 in | Wt 215.0 lb

## 2022-06-14 DIAGNOSIS — I5189 Other ill-defined heart diseases: Secondary | ICD-10-CM | POA: Diagnosis not present

## 2022-06-14 DIAGNOSIS — E785 Hyperlipidemia, unspecified: Secondary | ICD-10-CM

## 2022-06-14 DIAGNOSIS — E119 Type 2 diabetes mellitus without complications: Secondary | ICD-10-CM

## 2022-06-14 DIAGNOSIS — I340 Nonrheumatic mitral (valve) insufficiency: Secondary | ICD-10-CM

## 2022-06-14 DIAGNOSIS — I10 Essential (primary) hypertension: Secondary | ICD-10-CM | POA: Diagnosis not present

## 2022-06-14 DIAGNOSIS — R7989 Other specified abnormal findings of blood chemistry: Secondary | ICD-10-CM

## 2022-06-14 LAB — COMPREHENSIVE METABOLIC PANEL
ALT: 24 IU/L (ref 0–32)
AST: 22 IU/L (ref 0–40)
Albumin/Globulin Ratio: 1.7 (ref 1.2–2.2)
Albumin: 4.6 g/dL (ref 3.9–4.9)
Alkaline Phosphatase: 143 IU/L — ABNORMAL HIGH (ref 44–121)
BUN/Creatinine Ratio: 22 (ref 12–28)
BUN: 13 mg/dL (ref 8–27)
Bilirubin Total: 0.3 mg/dL (ref 0.0–1.2)
CO2: 23 mmol/L (ref 20–29)
Calcium: 9.6 mg/dL (ref 8.7–10.3)
Chloride: 101 mmol/L (ref 96–106)
Creatinine, Ser: 0.6 mg/dL (ref 0.57–1.00)
Globulin, Total: 2.7 g/dL (ref 1.5–4.5)
Glucose: 110 mg/dL — ABNORMAL HIGH (ref 70–99)
Potassium: 4.1 mmol/L (ref 3.5–5.2)
Sodium: 139 mmol/L (ref 134–144)
Total Protein: 7.3 g/dL (ref 6.0–8.5)
eGFR: 101 mL/min/{1.73_m2} (ref >59)

## 2022-06-14 LAB — LIPID PANEL
Chol/HDL Ratio: 4.5 ratio — ABNORMAL HIGH (ref 0.0–4.4)
Cholesterol, Total: 171 mg/dL (ref 100–199)
HDL: 38 mg/dL — ABNORMAL LOW (ref >39)
LDL Chol Calc (NIH): 106 mg/dL — ABNORMAL HIGH (ref 0–99)
Triglycerides: 155 mg/dL — ABNORMAL HIGH (ref 0–149)
VLDL Cholesterol Cal: 27 mg/dL (ref 5–40)

## 2022-06-14 LAB — TSH: TSH: 2.13 u[IU]/mL (ref 0.450–4.500)

## 2022-06-14 LAB — HEMOGLOBIN A1C
Est. average glucose Bld gHb Est-mCnc: 140 mg/dL
Hgb A1c MFr Bld: 6.5 % — ABNORMAL HIGH (ref 4.8–5.6)

## 2022-06-14 NOTE — Patient Instructions (Signed)
Medication Instructions:  No Changes *If you need a refill on your cardiac medications before your next appointment, please call your pharmacy*   Lab Work: CMET, HgbA1C, TSH, Lipid Panel  If you have labs (blood work) drawn today and your tests are completely normal, you will receive your results only by: [place] (if you have MyChart) OR A paper copy in the mail If you have any lab test that is abnormal or we need to change your treatment, we will call you to review the results.   Testing/Procedures: [address]. Your physician has requested that you have an echocardiogram. Echocardiography is a painless test that uses sound waves to create images of your heart. It provides your doctor with information about the size and shape of your heart and how well your heart's chambers and valves are working. This procedure takes approximately one hour. There are no restrictions for this procedure. Please do NOT wear cologne, perfume, aftershave, or lotions (deodorant is allowed). Please arrive 15 minutes prior to your appointment time.    Follow-Up: At [facility], you and your health needs are our priority.  As part of our continuing mission to provide you with exceptional heart care, we have created designated Provider Care Teams.  These Care Teams include your primary Cardiologist (physician) and Advanced Practice Providers (APPs -  Physician Assistants and Nurse Practitioners) who all work together to provide you with the care you need, when you need it.  We recommend signing up for the patient portal called "MyChart".  Sign up information is provided on this After Visit Summary.  MyChart is used to connect with patients for Virtual Visits (Telemedicine).  Patients are able to view lab/test results, encounter notes, upcoming appointments, etc.  Non-urgent messages can be sent to your provider as well.   To learn more about what you can do with MyChart, go to  [email or web address].    Your next appointment:   6 month(s)  Provider:   Minus Breeding, MD     Other Instructions: Call Office if Blood Pressure consistentley above 130/80

## 2022-06-15 ENCOUNTER — Encounter: Payer: Self-pay | Admitting: Cardiology

## 2022-06-15 DIAGNOSIS — R748 Abnormal levels of other serum enzymes: Secondary | ICD-10-CM

## 2022-06-15 DIAGNOSIS — E785 Hyperlipidemia, unspecified: Secondary | ICD-10-CM

## 2022-07-11 ENCOUNTER — Ambulatory Visit (INDEPENDENT_AMBULATORY_CARE_PROVIDER_SITE_OTHER): Payer: Commercial Managed Care - HMO

## 2022-07-11 ENCOUNTER — Encounter: Payer: Self-pay | Admitting: Pulmonary Disease

## 2022-07-11 ENCOUNTER — Telehealth: Payer: Self-pay

## 2022-07-11 ENCOUNTER — Ambulatory Visit: Payer: Commercial Managed Care - HMO | Admitting: Pulmonary Disease

## 2022-07-11 ENCOUNTER — Other Ambulatory Visit (HOSPITAL_COMMUNITY): Payer: Self-pay

## 2022-07-11 VITALS — BP 128/72 | HR 72 | Wt 215.0 lb

## 2022-07-11 DIAGNOSIS — R079 Chest pain, unspecified: Secondary | ICD-10-CM

## 2022-07-11 DIAGNOSIS — R7989 Other specified abnormal findings of blood chemistry: Secondary | ICD-10-CM

## 2022-07-11 LAB — HEPATIC FUNCTION PANEL
ALT: 17 U/L (ref 0–35)
AST: 16 U/L (ref 0–37)
Albumin: 4.3 g/dL (ref 3.5–5.2)
Alkaline Phosphatase: 113 U/L (ref 39–117)
Bilirubin, Direct: 0.1 mg/dL (ref 0.0–0.3)
Total Bilirubin: 0.4 mg/dL (ref 0.2–1.2)
Total Protein: 7.5 g/dL (ref 6.0–8.3)

## 2022-07-11 NOTE — Telephone Encounter (Signed)
Insurance does cover Symbicort at this time, patient does have a deductible that needs to be met for the prices to go down. Alternative of generic Advair Diskus is also covered. This medication is typically cheaper so the co-pay may be overall cheaper.

## 2022-07-11 NOTE — Telephone Encounter (Signed)
Dr Silas Flood please advise on pharmacy message  Thank you sir

## 2022-07-11 NOTE — Telephone Encounter (Signed)
While in office today for follow up visit she states insurance is not covering Symbicort inhaler.   Can we run a ticket to see what is covered for her insurance currently  Thank you

## 2022-07-11 NOTE — Progress Notes (Signed)
$'@Patient'k$  ID: Alejandra [name], female    DOB: Sep 28, 1960, 62 y.o.   MRN: [id number]  Chief Complaint  Patient presents with   Follow-up    Pt is here for follow up for pna. Pt states she hurt her left side and her ribs/lungs hurt. No pressure can be applied due to pain. Hurts when she breaths deep. Pt states her insurance will not fill Symbicort. Pt is needing new LFTas drawn today.     Referring provider: Alroy Dust, L.Marlou Sa, MD  HPI:   62 y.o. woman whom we are seeing in follow-up for evaluation of hypoxemic respiratory failure and DOE following covid infection.  Hypoxemia has resolved.  Recent cardiology note reviewed.  Reports mechanical injury to left-sided chest.  2 days ago.  Reaching into the freezer leaning on that side.  Immediately sharp pain afterwards.  Chest x-ray performed today.  Hard to see ribs.  Do not see any obvious fracture.  Lungs overall appear improved from most recent chest x-ray in the fall 2023 but overall still persistent interstitial prominence, consistent with post-COVID scarring as demonstrated on CT high-res 03/2022.  Overall shortness of breath seems okay.  She is losing weight.  Off some diabetes medicines.  With extreme exertion she has chest discomfort tightness.  We discussed trying inhalers again.  We have not had success with this, Advair caused mouth sores, Symbicort not cost effective, too expensive.  Offered her trial of samples but she declines today wanting to avoid as many medicines as possible.   HPI at initial visit: Patient was at the [facility] 10/2021.  Developed what sounds like possible sinus infection, sinus congestion, malaise.  Not much issue with this.  She developed cough and dyspnea subsequently after returning home and about 7 to 10 days after initial symptoms.  She was found to be hypoxemic in the ED.  Chest x-ray reveals bilateral multifocal pneumonia with fairly dense infiltrates left lateral lung on my review interpretation.  This on  11/27/2021.  Most recent chest x-ray 12/16/2021 after discharge reveals similar appearing x-ray, largely unchanged on my review and interpretation.  CT scan PE protocol 12/03/2021 on my review and interpretation reveals no PE, diffuse anterior and upper lobe predominant groundglass opacities with more confluent and denser opacities scattered throughout consistent with multifocal pneumonia and consistent with COVID-pneumonia on my review and interpretation.  She was treated with antibiotics and steroids.  Gradually oxygen improved.  Was discharged on 3 to 4 L nasal cannula.  She had worsening symptoms a couple weeks ago.  Was placed on Levaquin.  With improvement.  Gradually it sounds like oxygen saturation has improved.  She is staying in the mid 90s on room air.  She reports recently spending 6 hours of oxygen with exertion, shopping around town, checking her oxygen saturations staying low to mid 90s.  She has nasal congestion.  Thick mucus.  Worsened starting oxygen.  Suspect drying out mucous membranes.  Other she has a history of seasonal allergies and sinus issues.  She has OSA.  She is not using CPAP currently she is on her oxygen.  PMH: Hypertension, diabetes, hyperlipidemia, seasonal allergies, sleep apnea on CPAP Surgical history: Cholecystectomy, hysterectomy Family history: First relatives with allergies Social history: Former smoker, quit 2005, lives in [place] / Pulmonary Flowsheets:   ACT:      No data to display           MMRC:     No data to display  Epworth:      No data to display           Tests:   FENO:  No results found for: "NITRICOXIDE"  PFT:     No data to display           WALK:      No data to display           Imaging: Personally reviewed and as per EMR discussion this note No results found.  Lab Results: Personally reviewed CBC    Component Value Date/Time   WBC 14.7 (H) 12/06/2021 0853   RBC  4.43 12/06/2021 0853   HGB 13.7 12/06/2021 0853   HCT 41.0 12/06/2021 0853   PLT 233 12/06/2021 0853   MCV 92.6 12/06/2021 0853   MCH 30.9 12/06/2021 0853   MCHC 33.4 12/06/2021 0853   RDW 13.3 12/06/2021 0853   LYMPHSABS 2.6 12/06/2021 0853   MONOABS 0.1 12/06/2021 0853   EOSABS 0.0 12/06/2021 0853   BASOSABS 0.0 12/06/2021 0853    BMET    Component Value Date/Time   NA 139 06/14/2022 1019   K 4.1 06/14/2022 1019   CL 101 06/14/2022 1019   CO2 23 06/14/2022 1019   GLUCOSE 110 (H) 06/14/2022 1019   GLUCOSE 110 (H) 12/06/2021 1120   BUN 13 06/14/2022 1019   CREATININE 0.60 06/14/2022 1019   CALCIUM 9.6 06/14/2022 1019   GFRNONAA >60 12/06/2021 1120   GFRAA >90 01/08/2012 1030    BNP    Component Value Date/Time   BNP 26.3 02/07/2022 1300   BNP 62.3 12/04/2021 0309    ProBNP No results found for: "PROBNP"  Specialty Problems       Pulmonary Problems   Pneumonia due to COVID-19 virus   Acute respiratory failure with hypoxia (HCC)    Allergies  Allergen Reactions   Atorvastatin Other (See Comments)    SOB   Bactrim [Sulfamethoxazole-Trimethoprim] Other (See Comments)    Mouth will be like metal   Celebrex [Celecoxib] Diarrhea   Codeine Nausea And Vomiting   Dilaudid [Hydromorphone Hcl] Nausea And Vomiting   Dilaudid [Hydromorphone]     Other reaction(s): Not available   Hydrochlorothiazide Other (See Comments)    CANNOT VOID   Meloxicam Other (See Comments)    FLU LIKE SYMPTOMS   Morphine And Related Nausea And Vomiting   Tape     Other reaction(s): Not available   Tramadol     unknown   Wound Dressing Adhesive    Amoxicillin-Pot Clavulanate Diarrhea   Other Hives and Rash    emysin   Wellbutrin [Bupropion] Swelling and Rash    Immunization History  Administered Date(s) Administered   Influenza Split 02/14/2018   Pneumococcal Polysaccharide-23 07/09/2019    Past Medical History:  Diagnosis Date   Arthritis    Diabetes ([place])      Tobacco History: Social History   Tobacco Use  Smoking Status Former  Smokeless Tobacco Never   Counseling given: Not Answered   Continue to not smoke  Outpatient Encounter Medications as of 07/11/2022  Medication Sig   albuterol (VENTOLIN HFA) 108 (90 Base) MCG/ACT inhaler Inhale 2 puffs into the lungs every 6 (six) hours as needed for wheezing or shortness of breath.   ALPRAZolam (XANAX) 0.5 MG tablet Take 0.5 mg by mouth 3 (three) times daily as needed.   aspirin EC 81 MG tablet Take 1 tablet (81 mg total) by mouth daily. Swallow whole.   benzonatate (TESSALON) 200 MG  capsule Take 1 capsule (200 mg total) by mouth 3 (three) times daily as needed for cough.   cetirizine (ZYRTEC) 10 MG chewable tablet Chew 10 mg by mouth daily.   glimepiride (AMARYL) 4 MG tablet Take 4 mg by mouth daily.   losartan (COZAAR) 50 MG tablet    metFORMIN (GLUCOPHAGE) 500 MG tablet Take 500 mg by mouth daily.   misoprostol (CYTOTEC) 100 MCG tablet Take 100 mcg by mouth daily as needed (For Inflammation per spouse).   nitrofurantoin, macrocrystal-monohydrate, (MACROBID) 100 MG capsule Take 100 mg by mouth daily.   OVER THE COUNTER MEDICATION Slenderice   oxymetazoline (AFRIN) 0.05 % nasal spray Place 1 spray into both nostrils 2 (two) times daily.   pantoprazole (PROTONIX) 40 MG tablet Take 40 mg by mouth daily.   potassium chloride (KLOR-CON) 10 MEQ tablet Take 1 tablet (10 mEq total) by mouth daily.   verapamil (CALAN-SR) 120 MG CR tablet    [DISCONTINUED] TRULICITY 1.5 0000000 SOPN Inject 1.5 mg into the skin daily. (Patient not taking: Reported on 06/14/2022)   No facility-administered encounter medications on file as of 07/11/2022.     Review of Systems  Review of Systems  N/a Physical Exam  BP 128/72 (BP Location: Left Arm, Patient Position: Sitting, Cuff Size: Normal)   Pulse 72   Wt 215 lb (97.5 kg)   SpO2 97%   BMI 39.32 kg/m   Wt Readings from Last 5 Encounters:  07/11/22 215  lb (97.5 kg)  06/14/22 215 lb (97.5 kg)  04/04/22 229 lb 6.4 oz (104.1 kg)  02/10/22 230 lb 3.2 oz (104.4 kg)  02/07/22 229 lb 12.8 oz (104.2 kg)    BMI Readings from Last 5 Encounters:  07/11/22 39.32 kg/m  06/14/22 39.32 kg/m  04/04/22 41.96 kg/m  02/10/22 42.10 kg/m  02/07/22 42.03 kg/m     Physical Exam General: Sitting in chair, no acute distress Eyes: EOMI, icterus Neck: Supple, no JVP Pulmonary: Clear, distant Cardiovascular: Warm, no edema Abdomen: Nondistended, sounds present MSK: No synovitis, no joint effusion Neuro: Normal gait, no weakness Psych: Normal mood, full affect   Assessment & Plan:    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  Plan for repeat CT scan later in the year to assess for stability.  She is to contact us when ready for this.  DOE: Likely related to postinfectious scarring after pneumonia.  Possible asthma.  Not successful in a trial of ICS/LABA due to cost, other side effects.  Offered again today.  She declines at this time.  OSA: On CPAP.  She reports she is using this.  Nothing to review today in terms of data, she brings no STD card or otherwise.   Return if symptoms worsen or fail to improve.   Lanier Clam, MD 07/11/2022

## 2022-07-11 NOTE — Patient Instructions (Addendum)
No changes to medications  Try using albuterol about 15-20 minutes before exercise or going outside  Can try salonpas lidocaine patches over area affected, place in morning, take off before bed  Send me a message when ready for repeat CT scan of the chest

## 2022-07-13 NOTE — Progress Notes (Signed)
CXR stable to slightly better than prior which is good news

## 2022-07-29 ENCOUNTER — Telehealth: Payer: Self-pay | Admitting: Pulmonary Disease

## 2022-07-29 NOTE — Telephone Encounter (Signed)
Received a faxed request for [employer] for medical records - returned form with a note that medical record requests need to go to [facility] - fax# [phone].

## 2022-08-04 IMAGING — XA DG FLUORO GUIDE NDL PLC/BX
2 series · 2 of 2 positions shown · non-contrast
Comparison: none

CLINICAL DATA: Chronic right shoulder pain.

[Series 1: ortho adipose · 1 of 1 slices shown (1 of 2)]
[im 1/1]
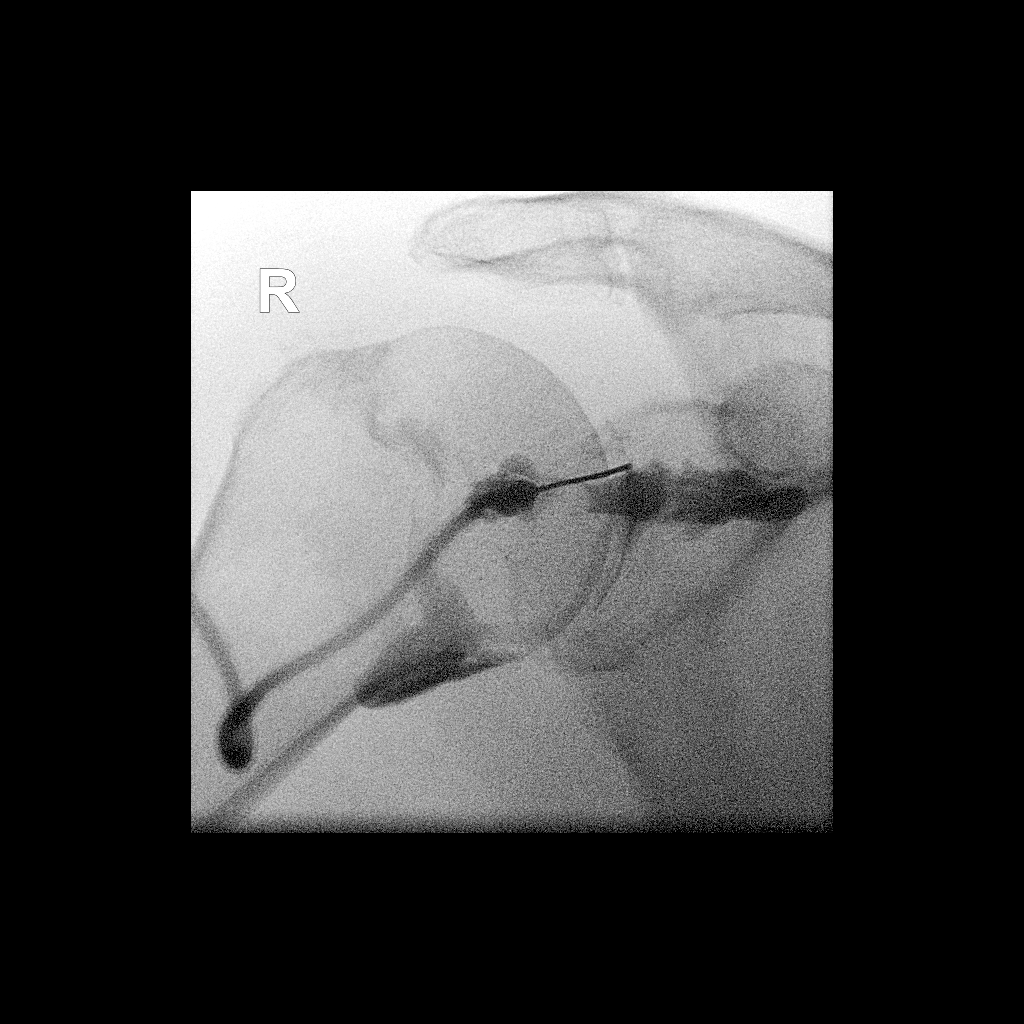

[Series 2: ortho adipose · 1 of 1 slices shown (2 of 2)]
[im 1/1]
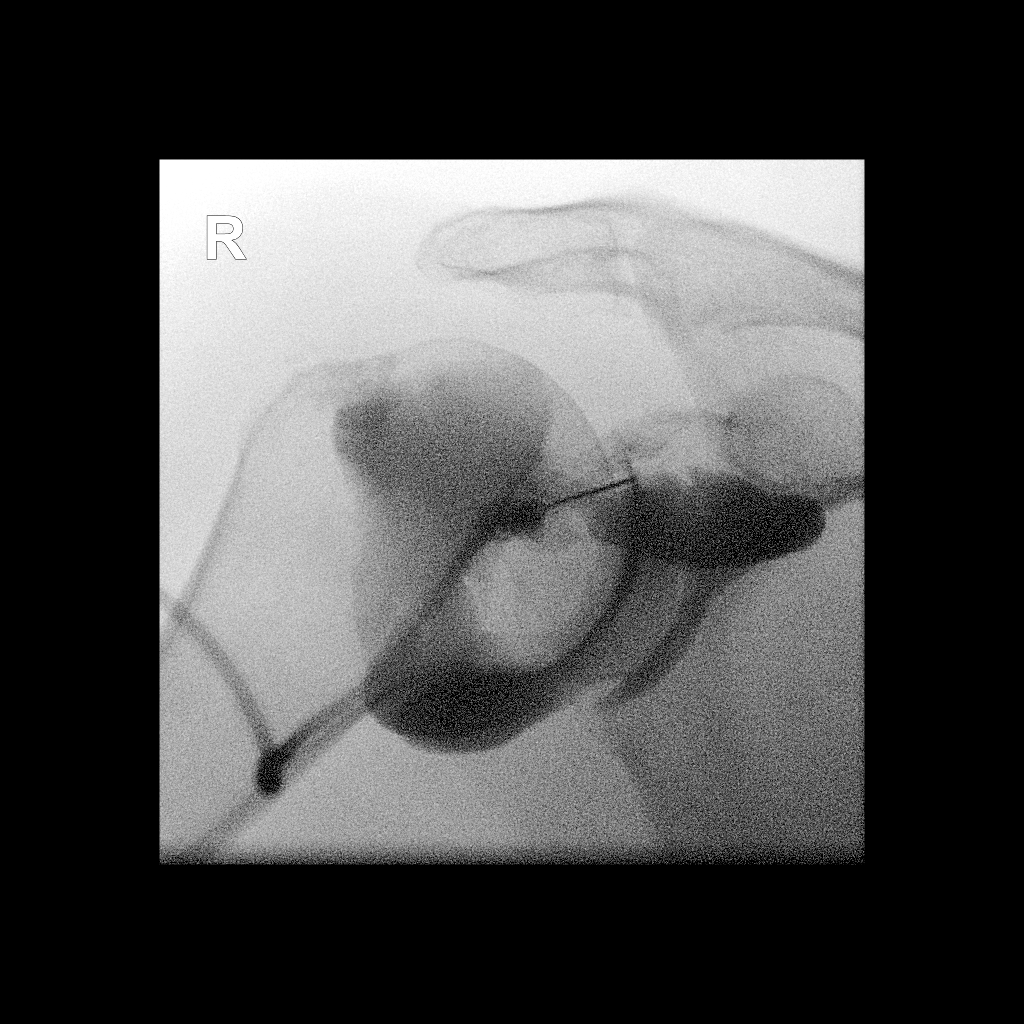

[2 of 2 positions shown; findings below may reference images not displayed]

FLUOROSCOPY TIME:  Radiation Exposure Index (as provided by the
fluoroscopic device): 1.2 mGy

Fluoroscopy Time:  15 seconds

Number of Acquired Images:  0

PROCEDURE:
The risks and benefits of the procedure were discussed with the
patient, and written informed consent was obtained. The patient
stated no history of allergy to contrast media. A formal timeout
procedure was performed with the patient according to departmental
protocol.

The patient was placed supine on the fluoroscopy table and the right
glenohumeral joint was identified under fluoroscopy. The skin
overlying the right glenohumeral joint was subsequently cleaned with
Betadine and a sterile drape was placed over the area of interest. 2
ml 1% Lidocaine was used to anesthetize the skin around the needle
insertion site.

A 22 gauge spinal needle was inserted into the right glenohumeral
joint under fluoroscopy.

12 ml of gadolinium mixture (0.1 ml of Multihance mixed with 15 ml
of Isovue-M 200 contrast and 5 ml of sterile saline) were injected
into the right glenohumeral joint.

The needle was removed and hemostasis was achieved. The patient was
subsequently transferred to MRI for imaging.
IMPRESSION: Technically successful right shoulder injection for MRI.

## 2022-09-21 NOTE — Telephone Encounter (Signed)
Pt. Asking if we have samples of symbicort

## 2022-09-23 NOTE — Telephone Encounter (Signed)
We no longer get samples of Symbicort. Have not had any in about 2 years.  Called and spoke with pt letting her know this and she verbalized understanding. Nothing further needed.

## 2022-12-05 ENCOUNTER — Other Ambulatory Visit (HOSPITAL_COMMUNITY): Payer: Commercial Managed Care - HMO

## 2022-12-12 ENCOUNTER — Ambulatory Visit: Payer: Commercial Managed Care - HMO | Admitting: Cardiology

## 2022-12-26 ENCOUNTER — Ambulatory Visit: Payer: Commercial Managed Care - HMO | Admitting: Cardiology

## 2023-02-02 ENCOUNTER — Ambulatory Visit (HOSPITAL_COMMUNITY): Payer: Managed Care, Other (non HMO) | Attending: Internal Medicine

## 2023-02-02 DIAGNOSIS — I340 Nonrheumatic mitral (valve) insufficiency: Secondary | ICD-10-CM

## 2023-02-02 DIAGNOSIS — E785 Hyperlipidemia, unspecified: Secondary | ICD-10-CM | POA: Insufficient documentation

## 2023-02-02 DIAGNOSIS — I10 Essential (primary) hypertension: Secondary | ICD-10-CM | POA: Insufficient documentation

## 2023-02-02 LAB — ECHOCARDIOGRAM COMPLETE
AR max vel: 1.15 cm2
AV Area VTI: 1.44 cm2
AV Area mean vel: 1.24 cm2
AV Mean grad: 7.3 mm[Hg]
AV Peak grad: 14.8 mm[Hg]
Ao pk vel: 1.92 m/s
Area-P 1/2: 2.66 cm2
S' Lateral: 2.8 cm

## 2023-02-06 ENCOUNTER — Telehealth: Payer: Self-pay

## 2023-02-06 NOTE — Telephone Encounter (Signed)
Spoke with pt. Discussed echo results with pt. Pt's next f/u is in 6 months.

## 2023-02-09 ENCOUNTER — Ambulatory Visit: Payer: Commercial Managed Care - HMO | Admitting: Cardiology

## 2023-07-28 ENCOUNTER — Ambulatory Visit: Payer: Managed Care, Other (non HMO) | Admitting: Cardiology

## 2023-08-29 NOTE — Progress Notes (Signed)
 Cardiology Office Note:    Date:  09/04/2023   ID:  MARVALEE [name], DOB Jul 01, 1960, MRN [id number]  PCP:  Napolean Backbone.Rozelle Corning, MD (Inactive)   [facility] Providers Cardiologist:  Eilleen Grates, MD Cardiology APP:  Lamond Pilot, [place]     Referring MD: Brenita Callow, Rolena Click.Rozelle Corning, MD   Chief Complaint  Patient presents with   Follow-up    Palpitations, chest pressure    History of Present Illness:    Alejandra [name] is a 63 y.o. female with a hx of DM, arthritis, obesity, OSA not compliant on CPAP, and HFpEF.  She has no prior cardiac history and was evaluated by her team during hospitalization on 11/29/2021.  She was diagnosed with COVID-19 and was quarantining.  However 4 days later she developed AMS and was brought to the ER.  Due to elevated troponin to 2836, cardiology was consulted.  Echocardiogram showed LVEF 55 to 60% and normal RV size and function with no regional wall motion abnormality.  Cardiology recommended medical treatment for suspected demand ischemia.  VQ scan was indeterminate for PE.  She was treated with IV heparin  and discharged on aspirin .  Required BiPAP treatment and was treated for multifocal pneumonia as well as COVID-19 infection, septic shock with an elevated lactic acid and procalcitonin greater than 150, and toxic/metabolic encephalopathy.  She required pressor support on admission with Levophed  and vasopressin .  She completed remdesivir  treatment, steroids, and antibiotics.  She was diuresed with Lasix  for hypervolemia and significant orthopnea.  Troponin did trend down prior to discharge.  Cardiology recommended possible ischemic evaluation once her pulmonary issues were improved.  I saw her 12/30/21 and she was being treated for PNA, still on supplemental O2. She was very upset at the way she was treated during her hospitalization at [facility].   She saw Dr. Lavonne Prairie in follow-up on 02/07/2022 and was still reporting shortness of breath.  She was self weaning her  oxygen.  BNP was WNL.  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  He felt elevated troponin was due to demand ischemia. Also mentioned a short trial of 20 mg demadex  with 10 mEq K to see if this improves breathing.   Recommend regular follow-up 01/2023 she was off of oxygen and weight was down 18 pounds.  She was taking over 80, weight reduction medications and was working hard to "get all of medications."  In the office I noted that her home blood pressure cuff was 20 points higher than our blood pressure cuff.  She has had a very stressful year - mother passed away, mother's husband has terminal cancer. Her DM has been out of control, she is not taking glimerperide occasionally. She reports lower energy levels. She was spreading mulch and felt heart pounding in her back. Has had a couple of episodes of "slight Afib" - no history of Afib. Palpitations lasted 4-5 min, this has occurred three times since Oct. She only has 1 cup of coffee daily. She notes one reading on her pulse ox that showed HR 29 - but also in the setting of palpitations.   With further discussion, she is having some chest pressure with exertion and with rest (when waking in the morning). She has also had bilateral bicep pain that she thought was her prior frozen shoulder.    Past Medical History:  Diagnosis Date   Arthritis    Diabetes (HCC)     History reviewed. No  pertinent surgical history.  Current Medications: Current Meds  Medication Sig   glimepiride (AMARYL) 4 MG tablet Take 4 mg by mouth daily.   losartan (COZAAR) 50 MG tablet    metFORMIN (GLUCOPHAGE) 500 MG tablet Take 500 mg by mouth daily.   metoprolol tartrate (LOPRESSOR) 25 MG tablet Take 1 tablet (25 mg total) by mouth once for 1 dose. Take 90-120 minutes prior to scan. Hold for SBP less than 110. Please take the additional dose with you to your scan to take as needed.   misoprostol  (CYTOTEC ) 100 MCG  tablet Take 100 mcg by mouth daily as needed (For Inflammation per spouse).   nitrofurantoin, macrocrystal-monohydrate, (MACROBID) 100 MG capsule Take 100 mg by mouth daily.   pantoprazole  (PROTONIX ) 40 MG tablet Take 40 mg by mouth daily.   verapamil (CALAN-SR) 120 MG CR tablet      Allergies:   Atorvastatin, Bactrim [sulfamethoxazole-trimethoprim], Celebrex [celecoxib], Codeine, Dilaudid [hydromorphone hcl], Dilaudid [hydromorphone], Hydrochlorothiazide, Meloxicam, Morphine and codeine, Tape, Tramadol, Wound dressing adhesive, Amoxicillin-pot clavulanate, Other, and Wellbutrin [bupropion]   Social History   Socioeconomic History   Marital status: Married    Spouse name: Not on file   Number of children: Not on file   Years of education: Not on file   Highest education level: Not on file  Occupational History   Not on file  Tobacco Use   Smoking status: Former   Smokeless tobacco: Never  Substance and Sexual Activity   Alcohol use: No   Drug use: No   Sexual activity: Not on file  Other Topics Concern   Not on file  Social History Narrative   Not on file   Social Drivers of Health   Financial Resource Strain: Not on file  Food Insecurity: Not on file  Transportation Needs: Not on file  Physical Activity: Not on file  Stress: Not on file  Social Connections: Not on file     Family History: The patient's family history includes Hypertension in her father and mother.  ROS:   Please see the history of present illness.     All other systems reviewed and are negative.  EKGs/Labs/Other Studies Reviewed:    The following studies were reviewed today:  EKG Interpretation Date/Time:  Monday Sep 04 2023 09:20:23 EDT Ventricular Rate:  73 PR Interval:  194 QRS Duration:  74 QT Interval:  380 QTC Calculation: 418 R Axis:   15  Text Interpretation: Normal sinus rhythm Low voltage QRS Cannot rule out Anterior infarct , age undetermined When compared with ECG of  02-Dec-2021 11:43, No significant change was found Confirmed by Marcie Sever ([id number]) on 09/04/2023 9:32:33 AM    Recent Labs: No results found for requested labs within last 365 days.  Recent Lipid Panel    Component Value Date/Time   CHOL 171 06/14/2022 1019   TRIG 155 (H) 06/14/2022 1019   HDL 38 (L) 06/14/2022 1019   CHOLHDL 4.5 (H) 06/14/2022 1019   CHOLHDL 2.9 11/30/2021 0323   VLDL 16 11/30/2021 0323   LDLCALC 106 (H) 06/14/2022 1019     Risk Assessment/Calculations:                Physical Exam:    VS:  BP 122/72 (BP Location: Right Arm)   Pulse 73   Ht 5\' 2"  (1.575 m)   Wt 210 lb (95.3 kg)   BMI 38.41 kg/m     Wt Readings from Last 3 Encounters:  09/04/23 210 lb (95.3 kg)  07/11/22 215 lb (97.5 kg)  06/14/22 215 lb (97.5 kg)     GEN:  Well nourished, well developed in no acute distress HEENT: Normal NECK: No JVD; No carotid bruits LYMPHATICS: No lymphadenopathy CARDIAC: RRR, no murmurs, rubs, gallops RESPIRATORY:  Clear to auscultation without rales, wheezing or rhonchi  ABDOMEN: Soft, non-tender, non-distended MUSCULOSKELETAL:  No edema; No deformity  SKIN: Warm and dry NEUROLOGIC:  Alert and oriented x 3 PSYCHIATRIC:  Normal affect   ASSESSMENT:    1. Chronic diastolic heart failure (HCC)   2. Coronary artery calcification   3. Chest pain of uncertain etiology   4. Palpitations   5. Primary hypertension   6. Hyperlipidemia with target LDL less than 70   7. Mitral valve insufficiency, unspecified etiology   8. Morbid obesity (HCC)   9. OSA (obstructive sleep apnea)   10. Precordial pain   11. Statin intolerance    PLAN:    In order of problems listed above:  Chest pressure - given coronary calcifications, will proceed with CT coronary - will need 25 mg lopressor x 2 tablets   Palpitations - very infrequent - she will get a kardia device   HFpEF Echocardiogram during her hospitalization showed an LVEF of 55-60% and normal diastolic  function.  RV function was normal, mild to moderate MR was noted.    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      Medication Adjustments/Labs and Tests Ordered: Current medicines are reviewed at length with the patient today.  Concerns regarding medicines are outlined above.  Orders Placed This Encounter  Procedures   CT CORONARY MORPH W/CTA COR W/SCORE W/CA W/CM &/OR WO/CM   Basic metabolic panel with GFR   AMB Referral to [facility] Pharm-D   EKG 12-Lead   Meds ordered this encounter  Medications   metoprolol tartrate (LOPRESSOR) 25 MG tablet    Sig: Take 1 tablet (25 mg total) by mouth once for 1 dose. Take 90-120 minutes prior to scan. Hold for SBP less than 110. Please take the additional dose with you to your scan to take as needed.    Dispense:  2 tablet    Refill:  0    Patient Instructions   Medication Instructions:  Your physician recommends that you continue on your current medications as directed. Please refer to the Current Medication list given to you today.  Referral to Pharm D. They will reach out to schedule.  *If you need a refill on your cardiac medications before your next appointment, please call your pharmacy*  Lab Work: None ordered.  You may go to any [facility] Location for your lab work:  [facility] - [id number] ([facility]) - [address] [phone] [address]  [place] [phone]  [place] [address]  - [address]   [place] - [address] - [address]  [place]  - [address] [place] - [address] ([facility]   If you have labs (blood work) drawn today and your tests are completely normal, you will receive your results only by: [employer] (if you have MyChart)  If you have any lab test that is abnormal or we need to change your treatment, we will call you or send a MyChart message to review the results.  Testing/Procedures: None ordered.  Follow-Up: At [facility], you and your health needs are our priority.  As part of our continuing mission to provide you with exceptional heart care, we have created designated Provider Care Teams.  These Care Teams include your primary Cardiologist (physician) and Advanced Practice Providers (APPs -  Physician Assistants and Nurse Practitioners) who all work together to provide you with the care you need, when you need it.   Your next appointment:   June 16th at 9:15am  Look into a Kardia device.   The format for your next appointment:   In Person  Provider:   Marcie Sever, PA         Signed, Warren Haber Shaquina Gillham, [place]  09/04/2023 10:14 AM    [facility]

## 2023-09-04 ENCOUNTER — Encounter: Payer: Self-pay | Admitting: Physician Assistant

## 2023-09-04 ENCOUNTER — Ambulatory Visit: Payer: Self-pay | Attending: Physician Assistant | Admitting: Physician Assistant

## 2023-09-04 VITALS — BP 122/72 | HR 73 | Ht 62.0 in | Wt 210.0 lb

## 2023-09-04 DIAGNOSIS — R079 Chest pain, unspecified: Secondary | ICD-10-CM | POA: Diagnosis not present

## 2023-09-04 DIAGNOSIS — I340 Nonrheumatic mitral (valve) insufficiency: Secondary | ICD-10-CM

## 2023-09-04 DIAGNOSIS — R002 Palpitations: Secondary | ICD-10-CM | POA: Diagnosis not present

## 2023-09-04 DIAGNOSIS — I2510 Atherosclerotic heart disease of native coronary artery without angina pectoris: Secondary | ICD-10-CM | POA: Diagnosis not present

## 2023-09-04 DIAGNOSIS — I5032 Chronic diastolic (congestive) heart failure: Secondary | ICD-10-CM | POA: Diagnosis not present

## 2023-09-04 DIAGNOSIS — I10 Essential (primary) hypertension: Secondary | ICD-10-CM

## 2023-09-04 DIAGNOSIS — G4733 Obstructive sleep apnea (adult) (pediatric): Secondary | ICD-10-CM

## 2023-09-04 DIAGNOSIS — R072 Precordial pain: Secondary | ICD-10-CM

## 2023-09-04 DIAGNOSIS — E785 Hyperlipidemia, unspecified: Secondary | ICD-10-CM

## 2023-09-04 DIAGNOSIS — Z789 Other specified health status: Secondary | ICD-10-CM

## 2023-09-04 MED ORDER — METOPROLOL TARTRATE 25 MG PO TABS
25.0000 mg | ORAL_TABLET | Freq: Once | ORAL | 0 refills | Status: DC
Start: 2023-09-04 — End: 2023-09-28

## 2023-09-04 NOTE — Patient Instructions (Addendum)
 Medication Instructions:  Your physician recommends that you continue on your current medications as directed. Please refer to the Current Medication list given to you today.  Referral to Pharm D. They will reach out to schedule.  *If you need a refill on your cardiac medications before your next appointment, please call your pharmacy*  Lab Work:  [facility] FIRST FLOOR AT [facility] IN THIS BUILDING--BMET   Testing/Procedures:    Your cardiac CT will be scheduled at one of the below locations:    [facility] [address]  [place], [place] [address] Opening August 28, 2023      If scheduled at the Heart and Vascular Tower at [facility], please enter the parking lot using the [address] street entrance and use the FREE valet service at the patient drop-off area. Enter the buidling and check-in with registration on the main floor.   Please follow these instructions carefully (unless otherwise directed):  An IV will be required for this test and Nitroglycerin will be given.  Hold all erectile dysfunction medications at least 3 days (72 hrs) prior to test. (Ie viagra, cialis, sildenafil, tadalafil, etc)   On the Night Before the Test: Be sure to Drink plenty of water. Do not consume any caffeinated/decaffeinated beverages or chocolate 12 hours prior to your test. Do not take any antihistamines 12 hours prior to your test.  DO NOT TAKE ZYRTEC 12 HOURS PRIOR TO THIS SCAN    On the Day of the Test: Drink plenty of water until 1 hour prior to the test. Do not eat any food 1 hour prior to test. You may take your regular medications prior to the test.  Take metoprolol 25 MG BY MOUTH (Lopressor) two hours prior to test.  PLEASE TAKE AN EXTRA METOPROLOL TABLET (25 MG TOTAL) WITH YOU TO YOUR SCAN TO TAKE AS NEEDED AND DIRECTED PER ANGIE DUKE PA-C continuous glucose monitor MUST remove the device prior to scanning. FEMALES- please wear  underwire-free bra if available, avoid dresses & tight clothing       After the Test: Drink plenty of water. After receiving IV contrast, you may experience a mild flushed feeling. This is normal. On occasion, you may experience a mild rash up to 24 hours after the test. This is not dangerous. If this occurs, you can take Benadryl 25 mg, Zyrtec, Claritin, or Allegra and increase your fluid intake. (Patients taking Tikosyn should avoid Benadryl, and may take Zyrtec, Claritin, or Allegra) If you experience trouble breathing, this can be serious. If it is severe call 911 IMMEDIATELY. If it is mild, please call our office.  We will call to schedule your test 2-4 weeks out understanding that some insurance companies will need an authorization prior to the service being performed.   For more information and frequently asked questions, please visit our website : [email or web address]  For non-scheduling related questions, please contact the cardiac imaging nurse navigator should you have any questions/concerns: Cardiac Imaging Nurse Navigators Direct Office Dial: [phone]   For scheduling needs, including cancellations and rescheduling, please call [place], [phone].     Follow-Up: At [facility], you and your health needs are our priority.  As part of our continuing mission to provide you with exceptional heart care, we have created designated Provider Care Teams.  These Care Teams include your primary Cardiologist (physician) and Advanced Practice Providers (APPs -  Physician Assistants and Nurse Practitioners) who all work together to provide you with the care you  need, when you need it.   Your next appointment:   June 16th at 9:15am  Look into a Kardia device.   The format for your next appointment:   In Person  Provider:   Marcie Sever, [place]

## 2023-09-05 LAB — BASIC METABOLIC PANEL WITH GFR
BUN/Creatinine Ratio: 24 (ref 12–28)
BUN: 14 mg/dL (ref 8–27)
CO2: 20 mmol/L (ref 20–29)
Calcium: 9 mg/dL (ref 8.7–10.3)
Chloride: 100 mmol/L (ref 96–106)
Creatinine, Ser: 0.58 mg/dL (ref 0.57–1.00)
Glucose: 100 mg/dL — ABNORMAL HIGH (ref 70–99)
Potassium: 4.4 mmol/L (ref 3.5–5.2)
Sodium: 140 mmol/L (ref 134–144)
eGFR: 102 mL/min/1.73

## 2023-09-18 ENCOUNTER — Encounter (HOSPITAL_COMMUNITY): Payer: Self-pay

## 2023-09-18 LAB — LAB REPORT - SCANNED
A1c: 7.2
Creatinine, POC: 74 mg/dL
EGFR (Non-African Amer.): 101
Microalb Creat Ratio: <9.5
Microalbumin, Urine: <0.7

## 2023-09-19 ENCOUNTER — Telehealth (HOSPITAL_COMMUNITY): Payer: Self-pay | Admitting: *Deleted

## 2023-09-19 NOTE — Telephone Encounter (Signed)
 Reaching out to patient to offer assistance regarding upcoming cardiac imaging study; pt verbalizes understanding of appt date/time, parking situation and where to check in, pre-test NPO status and medications ordered, and verified current allergies; name and call back number provided for further questions should they arise  Larey Brick RN Navigator Cardiac Imaging [facility] [phone] office [phone] cell  Patient to take 25mg  metoprolol tartrate two hours prior to her cardiac CT scan.

## 2023-09-20 ENCOUNTER — Ambulatory Visit (HOSPITAL_COMMUNITY)
Admission: RE | Admit: 2023-09-20 | Discharge: 2023-09-20 | Disposition: A | Discharge status: Home / Self Care | Source: Ambulatory Visit | Attending: Physician Assistant | Admitting: Physician Assistant

## 2023-09-20 ENCOUNTER — Other Ambulatory Visit: Payer: Self-pay | Admitting: Cardiology

## 2023-09-20 ENCOUNTER — Ambulatory Visit (HOSPITAL_BASED_OUTPATIENT_CLINIC_OR_DEPARTMENT_OTHER)
Admission: RE | Admit: 2023-09-20 | Discharge: 2023-09-20 | Disposition: A | Discharge status: Home / Self Care | Source: Ambulatory Visit | Attending: Cardiology | Admitting: Cardiology

## 2023-09-20 DIAGNOSIS — R072 Precordial pain: Secondary | ICD-10-CM | POA: Insufficient documentation

## 2023-09-20 DIAGNOSIS — R931 Abnormal findings on diagnostic imaging of heart and coronary circulation: Secondary | ICD-10-CM | POA: Insufficient documentation

## 2023-09-20 DIAGNOSIS — R079 Chest pain, unspecified: Secondary | ICD-10-CM | POA: Insufficient documentation

## 2023-09-20 DIAGNOSIS — I2510 Atherosclerotic heart disease of native coronary artery without angina pectoris: Secondary | ICD-10-CM | POA: Insufficient documentation

## 2023-09-20 MED ORDER — IOHEXOL 350 MG/ML SOLN
100.0000 mL | Freq: Once | INTRAVENOUS | Status: AC | PRN
  Administered 2023-09-20: 100 mL via INTRAVENOUS

## 2023-09-20 MED ORDER — NITROGLYCERIN 0.4 MG SL SUBL
0.8000 mg | SUBLINGUAL_TABLET | Freq: Once | SUBLINGUAL | Status: AC
  Administered 2023-09-20: 0.8 mg via SUBLINGUAL

## 2023-09-21 ENCOUNTER — Ambulatory Visit: Payer: Self-pay | Admitting: Student

## 2023-09-26 NOTE — H&P (View-Only) (Signed)
 Cardiology Office Note:   Date:  09/28/2023  ID:  Alejandra [name], DOB 1960/12/31, MRN [id number] PCP: Benedetto Brady, MD  [facility] Providers Cardiologist:  Eilleen Grates, MD Cardiology APP:  Lamond Pilot, PA {  History of Present Illness:   Alejandra [name] is a 63 y.o. female who presents for follow-up shortness of breath  She was in the hospital in 2023 with acute hypoxic respiratory failure secondary to COVID and pneumonia.  She had acute heart failure with an EF of 60%.  She also had AKI.  She had an elevated troponin.   She has followed with Angie Duke, PAc.  She was sent for a CT and had high coronary plaque burden and moderate disease including 50 - 69% prox/mid LAD with borderline FFR.       I brought her back to discuss these results.  She reports that she has had chest discomfort.  She does describe a "elephant sitting on her chest."  This happens sporadically but can happen when she tries to exert herself such as pulling 100 foot hose around her yard or doing her flowers.  She says she has always had some lung issues since she had pneumonia and she has a little trouble sorting out some of her chest discomfort and shortness of breath from that pulmonary event but she thinks her symptoms are worse and can be described as unstable in the situation.  She is not having any PND or orthopnea.  She is not having any presyncope or syncope but she does have occasional palpitations.  These are somewhat short-lived.  Of note on her CT she had nonobstructive plaque in the circumflex and RCA with a high plaque burden and elevated calcium.   ROS: Positive for emotional stress.  Otherwise as stated in the HPI and negative for all other systems.  Studies Reviewed:    EKG:   Sinus rhythm, rate 73, axis within normal limits, intervals within normal limits, poor anterior R wave progression, no acute ST-T wave changes, 09/04/2023  Risk Assessment/Calculations:         Physical Exam:    VS:  BP 124/60   Pulse 70   Ht 5\' 2"  (1.575 m)   Wt 213 lb (96.6 kg)   SpO2 95%   BMI 38.96 kg/m    Wt Readings from Last 3 Encounters:  09/28/23 213 lb (96.6 kg)  09/04/23 210 lb (95.3 kg)  07/11/22 215 lb (97.5 kg)     GEN: Well nourished, well developed in no acute distress NECK: No JVD; No carotid bruits CARDIAC: RRR, no murmurs, rubs, gallops RESPIRATORY:  Clear to auscultation without rales, wheezing or rhonchi  ABDOMEN: Soft, non-tender, non-distended EXTREMITIES:  No edema; No deformity   ASSESSMENT AND PLAN:   Chest pressure: The patient has discomfort consistent with unstable angina with what appears to be obstructive LAD disease and nonobstructive disease elsewhere.  Cardiac catheterization is indicated. The patient understands that risks included but are not limited to stroke (1 in 1000), death (1 in 1000), kidney failure [usually temporary] (1 in 500), bleeding (1 in 200), allergic reaction [possibly serious] (1 in 200).  The patient understands and agrees to proceed.  She needs aggressive risk reduction.=  Palpitations: These are infrequent.  No change in therapy.  She has been to get some kind home monitoring.  HFpEF: She has had a well-preserved ejection fraction.  There is some mild to moderate mitral digitation that we can follow clinically.  Hypertension:  Her blood pressure is at target.  No change in therapy.   MR: As above. Hyperlipidemia: She is intolerant of statins and she is being sent to our [facility] for PCSK9.  Goal should be an LDL in the 50s.    Hx of COVID-19 infection:   She is followed by pulmonary and now off home O2.   OSA : She is not able to wear CPAP.     Follow up with Angie Duke PAc after the cath.   Signed, Eilleen Grates, MD

## 2023-09-26 NOTE — Progress Notes (Unsigned)
 Cardiology Office Note:   Date:  09/28/2023  ID:  Alejandra [name], DOB June 20, 1960, MRN [id number] PCP: Benedetto Brady, MD  [facility] Providers Cardiologist:  Eilleen Grates, MD Cardiology APP:  Lamond Pilot, PA {  History of Present Illness:   Alejandra [name] is a 63 y.o. female who presents for follow-up shortness of breath  She was in the hospital in 2023 with acute hypoxic respiratory failure secondary to COVID and pneumonia.  She had acute heart failure with an EF of 60%.  She also had AKI.  She had an elevated troponin.   She has followed with Angie Duke, PAc.  She was sent for a CT and had high coronary plaque burden and moderate disease including 50 - 69% prox/mid LAD with borderline FFR.       I brought her back to discuss these results.  She reports that she has had chest discomfort.  She does describe a "elephant sitting on her chest."  This happens sporadically but can happen when she tries to exert herself such as pulling 100 foot hose around her yard or doing her flowers.  She says she has always had some lung issues since she had pneumonia and she has a little trouble sorting out some of her chest discomfort and shortness of breath from that pulmonary event but she thinks her symptoms are worse and can be described as unstable in the situation.  She is not having any PND or orthopnea.  She is not having any presyncope or syncope but she does have occasional palpitations.  These are somewhat short-lived.  Of note on her CT she had nonobstructive plaque in the circumflex and RCA with a high plaque burden and elevated calcium.   ROS: Positive for emotional stress.  Otherwise as stated in the HPI and negative for all other systems.  Studies Reviewed:    EKG:   Sinus rhythm, rate 73, axis within normal limits, intervals within normal limits, poor anterior R wave progression, no acute ST-T wave changes, 09/04/2023  Risk Assessment/Calculations:         Physical Exam:    VS:  BP 124/60   Pulse 70   Ht 5\' 2"  (1.575 m)   Wt 213 lb (96.6 kg)   SpO2 95%   BMI 38.96 kg/m    Wt Readings from Last 3 Encounters:  09/28/23 213 lb (96.6 kg)  09/04/23 210 lb (95.3 kg)  07/11/22 215 lb (97.5 kg)     GEN: Well nourished, well developed in no acute distress NECK: No JVD; No carotid bruits CARDIAC: RRR, no murmurs, rubs, gallops RESPIRATORY:  Clear to auscultation without rales, wheezing or rhonchi  ABDOMEN: Soft, non-tender, non-distended EXTREMITIES:  No edema; No deformity   ASSESSMENT AND PLAN:   Chest pressure: The patient has discomfort consistent with unstable angina with what appears to be obstructive LAD disease and nonobstructive disease elsewhere.  Cardiac catheterization is indicated. The patient understands that risks included but are not limited to stroke (1 in 1000), death (1 in 1000), kidney failure [usually temporary] (1 in 500), bleeding (1 in 200), allergic reaction [possibly serious] (1 in 200).  The patient understands and agrees to proceed.  She needs aggressive risk reduction.=  Palpitations: These are infrequent.  No change in therapy.  She has been to get some kind home monitoring.  HFpEF: She has had a well-preserved ejection fraction.  There is some mild to moderate mitral digitation that we can follow clinically.  Hypertension:  Her blood pressure is at target.  No change in therapy.   MR: As above. Hyperlipidemia: She is intolerant of statins and she is being sent to our [facility] for PCSK9.  Goal should be an LDL in the 50s.    Hx of COVID-19 infection:   She is followed by pulmonary and now off home O2.   OSA : She is not able to wear CPAP.     Follow up with Angie Duke PAc after the cath.   Signed, Eilleen Grates, MD

## 2023-09-28 ENCOUNTER — Telehealth: Payer: Self-pay | Admitting: Nurse Practitioner

## 2023-09-28 ENCOUNTER — Ambulatory Visit: Attending: Cardiology | Admitting: Cardiology

## 2023-09-28 ENCOUNTER — Encounter: Payer: Self-pay | Admitting: Cardiology

## 2023-09-28 VITALS — BP 124/60 | HR 70 | Ht 62.0 in | Wt 213.0 lb

## 2023-09-28 DIAGNOSIS — I2510 Atherosclerotic heart disease of native coronary artery without angina pectoris: Secondary | ICD-10-CM | POA: Diagnosis not present

## 2023-09-28 DIAGNOSIS — R931 Abnormal findings on diagnostic imaging of heart and coronary circulation: Secondary | ICD-10-CM

## 2023-09-28 DIAGNOSIS — R079 Chest pain, unspecified: Secondary | ICD-10-CM | POA: Diagnosis not present

## 2023-09-28 NOTE — Telephone Encounter (Signed)
   Pt called asking if she can take prn Xanax  this evening, prior to cath tomorrow.  She has taken Xanax  prn before and tolerates well.  I advised that she may take xanax  tonight.  Caller verbalized understanding and was grateful for the call back.  Laneta Pintos, NP 09/28/2023, 5:48 PM

## 2023-09-28 NOTE — Patient Instructions (Signed)
 Medication Instructions:   No changes  *If you need a refill on your cardiac medications before your next appointment, please call your pharmacy*   Lab Work: CBC If you have labs (blood work) drawn today and your tests are completely normal, you will receive your results only by: MyChart Message (if you have MyChart) OR A paper copy in the mail If you have any lab test that is abnormal or we need to change your treatment, we will call you to review the results.   Testing/Procedures: Sep 29, 2023 Your physician has requested that you have a cardiac catheterization. Cardiac catheterization is used to diagnose and/or treat various heart conditions. Doctors may recommend this procedure for a number of different reasons. The most common reason is to evaluate chest pain. Chest pain can be a symptom of coronary artery disease (CAD), and cardiac catheterization can show whether plaque is narrowing or blocking your heart's arteries. This procedure is also used to evaluate the valves, as well as measure the blood flow and oxygen levels in different parts of your heart. Please follow instruction sheet, as given.   Follow-Up: At [facility], you and your health needs are our priority.  As part of our continuing mission to provide you with exceptional heart care, we have created designated Provider Care Teams.  These Care Teams include your primary Cardiologist (physician) and Advanced Practice Providers (APPs -  Physician Assistants and Nurse Practitioners) who all work together to provide you with the care you need, when you need it.     Your next appointment:   2 month(s)  The format for your next appointment:   In Person  Provider:   Marcie Sever. PA   Other Instructions    [facility] A DEPT OF [place]. [facility] AT [address] A DEPT OF THE [place]. [facility] [address] [place] [address] Dept: [phone] Loc: [phone]  Alejandra [name]  09/28/2023  You are scheduled for a Cardiac Catheterization on Friday, May 30 with Dr. Arnoldo Lapping.  1. Please arrive at the [facility] (Main Entrance A) at [facility]: [address] [place], [place] [address] at 7:00 AM (This time is 2 hour(s) before your procedure to ensure your preparation).   Free valet parking service is available. You will check in at ADMITTING. The support person will be asked to wait in the waiting room.  It is OK to have someone drop you off and come back when you are ready to be discharged.    Special note: Every effort is made to have your procedure done on time. Please understand that emergencies sometimes delay scheduled procedures.  2. Diet: Do not eat solid foods after midnight.  The patient may have clear liquids until 5am upon the day of the procedure.  3. Labs: You will need to have blood drawn CBC. You do not need to be fasting.  4. Medication instructions in preparation for your procedure:   Contrast Allergy: No       Stop taking, Cozaar (Losartan) Friday, May 30,    Metformin , Glimepiride  Friday, May 30,  On the morning of your procedure, take your Aspirin  81 mg and any morning medicines NOT listed above.  You may use sips of water.  5. Plan to go home the same day, you will only stay overnight if medically necessary. 6. Bring a current list of your medications and current insurance cards. 7. You  MUST have a responsible person to drive you home. 8. Someone MUST be with you the first 24 hours after you arrive home or your discharge will be delayed. 9. Please wear clothes that are easy to get on and off and wear slip-on shoes.  Thank you for allowing us  to care for you!   -- [name]

## 2023-09-28 NOTE — Addendum Note (Signed)
 Addended by: Bebe Bourdon on: 09/28/2023 01:59 PM   Modules accepted: Orders

## 2023-09-29 ENCOUNTER — Other Ambulatory Visit: Payer: Self-pay

## 2023-09-29 ENCOUNTER — Encounter (HOSPITAL_COMMUNITY)
Admission: RE | Disposition: A | Discharge status: Home / Self Care | Payer: Self-pay | Source: Ambulatory Visit | Attending: Cardiovascular Disease

## 2023-09-29 ENCOUNTER — Ambulatory Visit (HOSPITAL_COMMUNITY)
Admission: RE | Admit: 2023-09-29 | Discharge: 2023-09-29 | Disposition: A | Discharge status: Home / Self Care | Source: Ambulatory Visit | Attending: Cardiovascular Disease | Admitting: Cardiovascular Disease

## 2023-09-29 DIAGNOSIS — I5030 Unspecified diastolic (congestive) heart failure: Secondary | ICD-10-CM | POA: Diagnosis not present

## 2023-09-29 DIAGNOSIS — Z8616 Personal history of COVID-19: Secondary | ICD-10-CM | POA: Insufficient documentation

## 2023-09-29 DIAGNOSIS — G4733 Obstructive sleep apnea (adult) (pediatric): Secondary | ICD-10-CM | POA: Diagnosis not present

## 2023-09-29 DIAGNOSIS — I110 Hypertensive heart disease with heart failure: Secondary | ICD-10-CM | POA: Insufficient documentation

## 2023-09-29 DIAGNOSIS — E785 Hyperlipidemia, unspecified: Secondary | ICD-10-CM | POA: Diagnosis not present

## 2023-09-29 DIAGNOSIS — R002 Palpitations: Secondary | ICD-10-CM | POA: Diagnosis not present

## 2023-09-29 DIAGNOSIS — R931 Abnormal findings on diagnostic imaging of heart and coronary circulation: Secondary | ICD-10-CM | POA: Diagnosis present

## 2023-09-29 DIAGNOSIS — I2510 Atherosclerotic heart disease of native coronary artery without angina pectoris: Secondary | ICD-10-CM | POA: Insufficient documentation

## 2023-09-29 DIAGNOSIS — R079 Chest pain, unspecified: Secondary | ICD-10-CM

## 2023-09-29 HISTORY — PX: LEFT HEART CATH AND CORONARY ANGIOGRAPHY: CATH118249

## 2023-09-29 LAB — CBC
HCT: 43.2 % (ref 36.0–46.0)
Hemoglobin: 14.2 g/dL (ref 12.0–15.0)
MCH: 30.2 pg (ref 26.0–34.0)
MCHC: 32.9 g/dL (ref 30.0–36.0)
MCV: 91.9 fL (ref 80.0–100.0)
Platelets: 226 10*3/uL (ref 150–400)
RBC: 4.7 MIL/uL (ref 3.87–5.11)
RDW: 12.8 % (ref 11.5–15.5)
WBC: 6.4 10*3/uL (ref 4.0–10.5)
nRBC: 0 % (ref 0.0–0.2)

## 2023-09-29 LAB — GLUCOSE, CAPILLARY
Glucose-Capillary: 166 mg/dL — ABNORMAL HIGH (ref 70–99)
Glucose-Capillary: 137 mg/dL — ABNORMAL HIGH (ref 70–99)

## 2023-09-29 SURGERY — LEFT HEART CATH AND CORONARY ANGIOGRAPHY
Anesthesia: LOCAL

## 2023-09-29 MED ORDER — FENTANYL CITRATE (PF) 100 MCG/2ML IJ SOLN
INTRAMUSCULAR | Status: DC | PRN
  Administered 2023-09-29: 25 ug via INTRAVENOUS

## 2023-09-29 MED ORDER — SODIUM CHLORIDE 0.9 % WEIGHT BASED INFUSION: 3.0000 mL/kg/h | INTRAVENOUS | Status: AC

## 2023-09-29 MED ORDER — LABETALOL HCL 5 MG/ML IV SOLN: 10.0000 mg | INTRAVENOUS | Status: DC | PRN

## 2023-09-29 MED ORDER — HEPARIN SODIUM (PORCINE) 1000 UNIT/ML IJ SOLN
INTRAMUSCULAR | Status: AC
  Filled 2023-09-29: qty 10

## 2023-09-29 MED ORDER — ACETAMINOPHEN 325 MG PO TABS: 650.0000 mg | ORAL_TABLET | ORAL | Status: DC | PRN

## 2023-09-29 MED ORDER — HYDRALAZINE HCL 20 MG/ML IJ SOLN: 10.0000 mg | INTRAMUSCULAR | Status: DC | PRN

## 2023-09-29 MED ORDER — HEPARIN (PORCINE) IN NACL 1000-0.9 UT/500ML-% IV SOLN
INTRAVENOUS | Status: DC | PRN
Start: 2023-09-29 — End: 2023-09-29
  Administered 2023-09-29 (×2): 500 mL

## 2023-09-29 MED ORDER — SODIUM CHLORIDE 0.9 % IV SOLN: 250.0000 mL | INTRAVENOUS | Status: DC | PRN

## 2023-09-29 MED ORDER — LIDOCAINE HCL (PF) 1 % IJ SOLN
INTRAMUSCULAR | Status: DC | PRN
  Administered 2023-09-29: 2 mL

## 2023-09-29 MED ORDER — IOHEXOL 350 MG/ML SOLN
INTRAVENOUS | Status: DC | PRN
  Administered 2023-09-29: 40 mL

## 2023-09-29 MED ORDER — SODIUM CHLORIDE 0.9 % WEIGHT BASED INFUSION: 1.0000 mL/kg/h | INTRAVENOUS | Status: DC

## 2023-09-29 MED ORDER — VERAPAMIL HCL 2.5 MG/ML IV SOLN
INTRAVENOUS | Status: DC | PRN
  Administered 2023-09-29: 10 mL via INTRA_ARTERIAL

## 2023-09-29 MED ORDER — FENTANYL CITRATE (PF) 100 MCG/2ML IJ SOLN
INTRAMUSCULAR | Status: AC
  Filled 2023-09-29: qty 2

## 2023-09-29 MED ORDER — MIDAZOLAM HCL 2 MG/2ML IJ SOLN
INTRAMUSCULAR | Status: AC
Start: 2023-09-29 — End: ?
  Filled 2023-09-29: qty 2

## 2023-09-29 MED ORDER — LIDOCAINE HCL (PF) 1 % IJ SOLN
INTRAMUSCULAR | Status: AC
Start: 2023-09-29 — End: ?
  Filled 2023-09-29: qty 30

## 2023-09-29 MED ORDER — SODIUM CHLORIDE 0.9% FLUSH
3.0000 mL | Freq: Two times a day (BID) | INTRAVENOUS | Status: DC
Start: 2023-09-29 — End: 2023-09-29

## 2023-09-29 MED ORDER — ONDANSETRON HCL 4 MG/2ML IJ SOLN: 4.0000 mg | Freq: Four times a day (QID) | INTRAMUSCULAR | Status: DC | PRN

## 2023-09-29 MED ORDER — VERAPAMIL HCL 2.5 MG/ML IV SOLN
INTRAVENOUS | Status: AC
  Filled 2023-09-29: qty 2

## 2023-09-29 MED ORDER — MIDAZOLAM HCL 2 MG/2ML IJ SOLN
INTRAMUSCULAR | Status: DC | PRN
  Administered 2023-09-29: 2 mg via INTRAVENOUS

## 2023-09-29 MED ORDER — HEPARIN SODIUM (PORCINE) 1000 UNIT/ML IJ SOLN
INTRAMUSCULAR | Status: DC | PRN
  Administered 2023-09-29: 5000 [IU] via INTRAVENOUS

## 2023-09-29 MED ORDER — SODIUM CHLORIDE 0.9% FLUSH: 3.0000 mL | INTRAVENOUS | Status: DC | PRN

## 2023-09-29 MED ORDER — SODIUM CHLORIDE 0.9 % IV SOLN: INTRAVENOUS | Status: DC

## 2023-09-29 SURGICAL SUPPLY — 8 items
CATH 5FR JL3.5 JR4 ANG PIG MP (CATHETERS) IMPLANT
COVER SWIFTLINK CONNECTOR (BAG) IMPLANT
DEVICE RAD COMP TR BAND LRG (VASCULAR PRODUCTS) IMPLANT
GLIDESHEATH SLEND SS 6F .021 (SHEATH) IMPLANT
GUIDEWIRE INQWIRE 1.5J.035X260 (WIRE) IMPLANT
PACK CARDIAC CATHETERIZATION (CUSTOM PROCEDURE TRAY) ×2 IMPLANT
SET ATX-X65L (MISCELLANEOUS) IMPLANT
SHEATH PROBE COVER 6X72 (BAG) IMPLANT

## 2023-09-29 NOTE — Interval H&P Note (Signed)
 History and Physical Interval Note:  09/29/2023 7:32 AM  Alejandra [name]  has presented today for surgery, with the diagnosis of unstable angina - cad.  The various methods of treatment have been discussed with the patient and family. After consideration of risks, benefits and other options for treatment, the patient has consented to  Procedure(s): LEFT HEART CATH AND CORONARY ANGIOGRAPHY (N/A) as a surgical intervention.  The patient's history has been reviewed, patient examined, no change in status, stable for surgery.  I have reviewed the patient's chart and labs.  Questions were answered to the patient's satisfaction.     Arnoldo Lapping

## 2023-09-30 ENCOUNTER — Encounter (HOSPITAL_COMMUNITY): Payer: Self-pay | Admitting: Cardiovascular Disease

## 2023-10-03 ENCOUNTER — Telehealth (HOSPITAL_BASED_OUTPATIENT_CLINIC_OR_DEPARTMENT_OTHER): Payer: Self-pay

## 2023-10-03 NOTE — Telephone Encounter (Signed)
-----   Message from Eilleen Grates sent at 10/03/2023  9:01 AM EDT -----   ----- Message ----- From: SYSTEM Sent: 10/03/2023  12:15 AM EDT To: Eilleen Grates, MD

## 2023-10-04 ENCOUNTER — Encounter: Payer: Self-pay | Admitting: Family Medicine

## 2023-10-12 NOTE — Progress Notes (Signed)
 Cardiology Office Note:    Date:  10/16/2023   ID:  [name], DOB April 19, 1961, MRN [id number]  PCP:  Benedetto Brady, MD    [facility] Providers Cardiologist:  Eilleen Grates, MD Cardiology APP:  Lamond Pilot, PA {  Referring MD: No ref. provider found   Chief Complaint  Patient presents with   Follow-up    CAD    History of Present Illness:    Alejandra [name] is a 63 y.o. female with a hx of DM, arthritis, obesity, OSA not compliant on CPAP, and HFpEF.  She has no prior cardiac history and was evaluated by her team during hospitalization on 11/29/2021.  She was diagnosed with COVID-19 and was quarantining.  However 4 days later she developed AMS and was brought to the ER.  Due to elevated troponin to 2836, cardiology was consulted.  Echocardiogram showed LVEF 55 to 60% and normal RV size and function with no regional wall motion abnormality.  Cardiology recommended medical treatment for suspected demand ischemia.  VQ scan was indeterminate for PE.  She was treated with IV heparin  and discharged on aspirin .  Required BiPAP treatment and was treated for multifocal pneumonia as well as COVID-19 infection, septic shock with an elevated lactic acid and procalcitonin greater than 150, and toxic/metabolic encephalopathy.  She required pressor support on admission with Levophed  and vasopressin .  She completed remdesivir  treatment, steroids, and antibiotics.  She was diuresed with Lasix  for hypervolemia and significant orthopnea.  Troponin did trend down prior to discharge.  Cardiology recommended possible ischemic evaluation once her pulmonary issues were improved.  I saw her 12/30/21 and she was being treated for PNA, still on supplemental O2. She was very upset at the way she was treated during her hospitalization at [place] [name].   On 01/2023 she was off of oxygen and weight was down 18 pounds.  She was taking OTC weight reduction medications and was working hard to get all of  medications.  In the office I noted that her home blood pressure cuff was 20 points higher than our blood pressure cuff.  I saw her in the office 09/04/2023 and she reported chest pressure and bilateral bicep pain.  I sent her for CT coronary which demonstrated obstructive disease in the LAD and nonobstructive disease in the LCx and RCA.  She proceeded to [name] on 09/29/2023 which showed only mild nonobstructive coronary artery disease with 40% mid LAD stenosis and minimal nonobstructive plaque in the RCA and LCx, no intervention.  Normal LVEDP at the time of cath.  She returns today for post-cath follow-up.  Ongoing chest pain felt likely pulmonary in nature. Will refer back to pulmonology. She no longer take potassium supplement (prescribed 2 years ago for leg cramping). Will focus on risk factor management. Recommend taking 81 mg ASA.    Past Medical History:  Diagnosis Date   Arthritis    Diabetes Alejandra [name], Ale[facility]andra [name])     Past Surgical History:  Procedure Laterality Date   LEFT HEART CATH AND CORONARY ANGIOGRAPHY N/A 09/29/2023   Procedure: LEFT HEART CATH AND CORONARY ANGIOGRAPHY;  Surgeon: Arnoldo Lapping, MD;  Location: [name] INV[facility] CV [facility];  Service: Cardiovascular;  Laterality: N/A;    Current Medications: Current Meds  Medication Sig   albuterol  (VENTOLIN  HFA) 108 (90 Base) MCG/ACT inhaler Inhale 2 puffs into the lungs every 6 (six) hours as needed for wheezing or shortness of breath.   ALPRAZolam  (XANAX ) 0.5 MG tablet Take 0.5 mg by mouth 3 (three) times  daily as needed.   aspirin  EC 81 MG tablet Take 1 tablet (81 mg total) by mouth daily. Swallow whole. (Patient taking differently: Take 81 mg by mouth daily. Swallow whole. As needed)   benzonatate  (TESSALON ) 200 MG capsule Take 1 capsule (200 mg total) by mouth 3 (three) times daily as needed for cough.   glimepiride (AMARYL) 4 MG tablet Take 4 mg by mouth daily. (Patient taking differently: Take 2 mg by mouth daily. Per patient taking 2 mg daily)    losartan (COZAAR) 50 MG tablet  (Patient taking differently: daily.)   nitrofurantoin, macrocrystal-monohydrate, (MACROBID) 100 MG capsule Take 100 mg by mouth daily. (Patient taking differently: Take 100 mg by mouth daily. As needed)   oxymetazoline  (AFRIN) 0.05 % nasal spray Place 1 spray into both nostrils 2 (two) times daily. (Patient taking differently: Place 1 spray into both nostrils as needed.)   verapamil  (CALAN -SR) 120 MG CR tablet      Allergies:   Atorvastatin, Bactrim [sulfamethoxazole-trimethoprim], Celebrex [celecoxib], Codeine, Dilaudid [hydromorphone hcl], Dilaudid [hydromorphone], Hydrochlorothiazide, Meloxicam, Morphine and codeine, Tape, Tramadol, Wound dressing adhesive, Amoxicillin-pot clavulanate, Other, and Wellbutrin [bupropion]   Social History   Socioeconomic History   Marital status: Married    Spouse name: Not on file   Number of children: Not on file   Years of education: Not on file   Highest education level: Not on file  Occupational History   Not on file  Tobacco Use   Smoking status: Former   Smokeless tobacco: Never  Substance and Sexual Activity   Alcohol use: No   Drug use: No   Sexual activity: Not on file  Other Topics Concern   Not on file  Social History Narrative   Not on file   Social Drivers of Health   Financial Resource Strain: Not on file  Food Insecurity: Not on file  Transportation Needs: Not on file  Physical Activity: Not on file  Stress: Not on file  Social Connections: Not on file     Family History: The patient's family history includes Hypertension in her father and mother.  ROS:   Please see the history of present illness.     All other systems reviewed and are negative.  EKGs/Labs/Other Studies Reviewed:    The following studies were reviewed today:  EKG Interpretation Date/Time:  Monday October 16 2023 09:12:36 EDT Ventricular Rate:  64 PR Interval:  200 QRS Duration:  78 QT Interval:  402 QTC  Calculation: 414 R Axis:   48  Text Interpretation: Normal sinus rhythm Normal ECG When compared with ECG of 04-Sep-2023 09:20, Minimal criteria for Anterior infarct are no longer Present Confirmed by Marcie Sever ([id number]) on 10/16/2023 9:16:20 AM    Recent Labs: 09/04/2023: BUN 14; Creatinine, Ser 0.58; Potassium 4.4; Sodium 140 09/29/2023: Hemoglobin 14.2; Platelets 226  Recent Lipid Panel    Component Value Date/Time   CHOL 171 06/14/2022 1019   TRIG 155 (H) 06/14/2022 1019   HDL 38 (L) 06/14/2022 1019   CHOLHDL 4.5 (H) 06/14/2022 1019   CHOLHDL 2.9 11/30/2021 0323   VLDL 16 11/30/2021 0323   LDLCALC 106 (H) 06/14/2022 1019     Risk Assessment/Calculations:                Physical Exam:    VS:  BP 124/70   Pulse 64   Ht 5' 2 (1.575 m)   Wt 206 lb (93.4 kg)   SpO2 95%   BMI 37.68 kg/m  Wt Readings from Last 3 Encounters:  10/16/23 206 lb (93.4 kg)  09/29/23 (P) 213 lb (96.6 kg)  09/28/23 213 lb (96.6 kg)     GEN:  Well nourished, well developed in no acute distress HEENT: Normal NECK: No JVD; No carotid bruits LYMPHATICS: No lymphadenopathy CARDIAC: RRR, no murmurs, rubs, gallops RESPIRATORY:  Clear to auscultation without rales, wheezing or rhonchi  ABDOMEN: Soft, non-tender, non-distended MUSCULOSKELETAL:  No edema; No deformity  SKIN: Warm and dry NEUROLOGIC:  Alert and oriented x 3 PSYCHIATRIC:  Normal affect   ASSESSMENT:    1. Chronic diastolic heart failure (HCC)   2. Coronary artery calcification   3. Primary hypertension   4. Mitral valve insufficiency, unspecified etiology   5. Hyperlipidemia with target LDL less than 70    PLAN:    In order of problems listed above:  CAD - CT coronary concerning for obstructive disease in the LAD - Heart catheterization did not demonstrate obstructive disease, no PCI performed - Will continue risk factor management - Would keep on aspirin , losartan - will focus on pulmonary etiology for chest  discomfort   HFpEF Echocardiogram during her hospitalization showed an LVEF of 55-60% and normal diastolic function.  RV function was normal, mild to moderate MR was noted.    Hypertension Continued on verapamil , losartan   MR - mild Repeat echo with stable MR in 2024 - will plan to repeat 01/2024   Hyperlipidemia with LDL goal < 70 Lipid panel 10/9627: LDL 127 HDL 51 Trig 202 - statin intolerant - has an appt with pharmD for PCSK9i   DM Obesity  A1c 7.2% - she has made dietary changes - she is now taking less DM medications - she has lost 7-8 lbs with dietary changes   Follow up echo 01/2024, then office visit 04/2024    Medication Adjustments/Labs and Tests Ordered: Current medicines are reviewed at length with the patient today.  Concerns regarding medicines are outlined above.  Orders Placed This Encounter  Procedures   EKG 12-Lead   ECHOCARDIOGRAM COMPLETE   No orders of the defined types were placed in this encounter.   Patient Instructions  Medication Instructions:  Remember to take your Aspirin  81mg  daily. *If you need a refill on your cardiac medications before your next appointment, please call your pharmacy*   Lab Work: No labs were ordered during today's visit.  If you have labs (blood work) drawn today and your tests are completely normal, you will receive your results only by: MyChart Message (if you have MyChart) OR A paper copy in the mail If you have any lab test that is abnormal or we need to change your treatment, we will call you to review the results.   Testing/Procedures: Your physician has requested that you have an echocardiogram in October. Echocardiography is a painless test that uses sound waves to create images of your heart. It provides your doctor with information about the size and shape of your heart and how well your heart's chambers and valves are working. This procedure takes approximately one hour. There are no  restrictions for this procedure. Please do NOT wear cologne, perfume, aftershave, or lotions (deodorant is allowed). Please arrive 15 minutes prior to your appointment time.  Please note: We ask at that you not bring children with you during ultrasound (echo/ vascular) testing. Due to room size and safety concerns, children are not allowed in the ultrasound rooms during exams. Our front office staff cannot provide observation of children  in our lobby area while testing is being conducted. An adult accompanying a patient to their appointment will only be allowed in the ultrasound room at the discretion of the ultrasound technician under special circumstances. We apologize for any inconvenience.    Follow-Up: At [name], you and your health needs are our priority.  As part of our continuing mission to provide you with exceptional heart care, we have created designated Provider Care Teams.  These Care Teams include your primary Cardiologist (physician) and Advanced Practice Providers (APPs -  Physician Assistants and Nurse Practitioners) who all work together to provide you with the care you need, when you need it.  We recommend signing up for the patient portal called MyChart.  Sign up information is provided on this After Visit Summary.  MyChart is used to connect with patients for Virtual Visits (Telemedicine).  Patients are able to view lab/test results, encounter notes, upcoming appointments, etc.  Non-urgent messages can be sent to your provider as well.   To learn more about what you can do with MyChart, go to [email or web address].    Your next appointment:   6 month(s)  Provider:   Eilleen Grates, MD or Marcie Sever    Other Instructions A letter will be mailed to you as a reminder to call the office for your next follow up appointment.     Signed, Lamond Pilot, [place]  10/16/2023 9:47 AM    [facility]

## 2023-10-16 ENCOUNTER — Ambulatory Visit: Attending: Physician Assistant | Admitting: Physician Assistant

## 2023-10-16 ENCOUNTER — Encounter: Payer: Self-pay | Admitting: Physician Assistant

## 2023-10-16 VITALS — BP 124/70 | HR 64 | Ht 62.0 in | Wt 206.0 lb

## 2023-10-16 DIAGNOSIS — I2510 Atherosclerotic heart disease of native coronary artery without angina pectoris: Secondary | ICD-10-CM | POA: Diagnosis not present

## 2023-10-16 DIAGNOSIS — I340 Nonrheumatic mitral (valve) insufficiency: Secondary | ICD-10-CM

## 2023-10-16 DIAGNOSIS — I10 Essential (primary) hypertension: Secondary | ICD-10-CM

## 2023-10-16 DIAGNOSIS — I5032 Chronic diastolic (congestive) heart failure: Secondary | ICD-10-CM | POA: Diagnosis not present

## 2023-10-16 DIAGNOSIS — E785 Hyperlipidemia, unspecified: Secondary | ICD-10-CM

## 2023-10-16 NOTE — Patient Instructions (Addendum)
 Medication Instructions:  Remember to take your Aspirin  81mg  daily. *If you need a refill on your cardiac medications before your next appointment, please call your pharmacy*   Lab Work: No labs were ordered during today's visit.  If you have labs (blood work) drawn today and your tests are completely normal, you will receive your results only by: MyChart Message (if you have MyChart) OR A paper copy in the mail If you have any lab test that is abnormal or we need to change your treatment, we will call you to review the results.   Testing/Procedures: Your physician has requested that you have an echocardiogram in October. Echocardiography is a painless test that uses sound waves to create images of your heart. It provides your doctor with information about the size and shape of your heart and how well your heart's chambers and valves are working. This procedure takes approximately one hour. There are no restrictions for this procedure. Please do NOT wear cologne, perfume, aftershave, or lotions (deodorant is allowed). Please arrive 15 minutes prior to your appointment time.  Please note: We ask at that you not bring children with you during ultrasound (echo/ vascular) testing. Due to room size and safety concerns, children are not allowed in the ultrasound rooms during exams. Our front office staff cannot provide observation of children in our lobby area while testing is being conducted. An adult accompanying a patient to their appointment will only be allowed in the ultrasound room at the discretion of the ultrasound technician under special circumstances. We apologize for any inconvenience.    Follow-Up: At [facility], you and your health needs are our priority.  As part of our continuing mission to provide you with exceptional heart care, we have created designated Provider Care Teams.  These Care Teams include your primary Cardiologist (physician) and Advanced Practice Providers  (APPs -  Physician Assistants and Nurse Practitioners) who all work together to provide you with the care you need, when you need it.  We recommend signing up for the patient portal called MyChart.  Sign up information is provided on this After Visit Summary.  MyChart is used to connect with patients for Virtual Visits (Telemedicine).  Patients are able to view lab/test results, encounter notes, upcoming appointments, etc.  Non-urgent messages can be sent to your provider as well.   To learn more about what you can do with MyChart, go to [email or web address].    Your next appointment:   6 month(s)  Provider:   Eilleen Grates, MD or Marcie Sever    Other Instructions A letter will be mailed to you as a reminder to call the office for your next follow up appointment.

## 2023-10-24 ENCOUNTER — Telehealth: Payer: Self-pay

## 2023-10-24 ENCOUNTER — Ambulatory Visit: Attending: Cardiovascular Disease | Admitting: Pharmacist

## 2023-10-24 ENCOUNTER — Other Ambulatory Visit (HOSPITAL_COMMUNITY): Payer: Self-pay

## 2023-10-24 DIAGNOSIS — E785 Hyperlipidemia, unspecified: Secondary | ICD-10-CM | POA: Diagnosis not present

## 2023-10-24 NOTE — Addendum Note (Signed)
 Addended by: Thy Gullikson D on: 10/24/2023 01:28 PM   Modules accepted: Orders

## 2023-10-24 NOTE — Telephone Encounter (Signed)
 Pharmacy Patient Advocate Encounter   Received notification from Physician's Office that prior authorization for REPATHA is required/requested.   Insurance verification completed.   The patient is insured through [employer] .   Per test claim: PA required; PA submitted to above mentioned insurance via CoverMyMeds Key/confirmation #/EOC [facility] Status is pending

## 2023-10-24 NOTE — Progress Notes (Signed)
 Patient ID: Alejandra [name]                 DOB: 1960-09-29                    MRN: [id number]      HPI: Alejandra [name] is a 63 y.o. female patient referred to lipid clinic by Dr. Lavona. PMH is significant for HTN, HFpEF, OSA, HLD, mild non-obstructive CAD per cath (2025). Hx of statin intolerance. Was referred to lipid clinic.   Patient presents today to lipid clinic. She reports previous intolerance to atorvastatin and rosuvastatin. Possible another but not sure of name. Reports flu like symptoms, terrible muscle aches.  She states she has muscle pain at baseline. Lying down makes it worse. She was walking 1 hr a day but her lungs cannot handle the heat. Knows she should find an indoor place to walk but has been busy. Describes her diet as clean.  Reviewed options for lowering LDL cholesterol, including ezetimibe, PCSK-9 inhibitors, bempedoic acid.  Discussed mechanisms of action, dosing, side effects and potential decreases in LDL cholesterol.  Also reviewed cost information and potential options for copay cards. Injection technique for Repatha reviewed.  Current Medications: none Intolerances: atorvastatin, rosuvastatin (flu like symptoms, muscle aches) Risk Factors: DM, HTN, premature CAD LDL-C goal: <55 per Dr. Lavona ApoB goal:   Diet: vegetables, lean protein Water, no sugar Fruit Salads, chicken, fish, almonds  Exercise: yard work, walks 1 hr daily, but not in the extreme heat  Family History:  Family History  Problem Relation Age of Onset   Hypertension Mother    Hypertension Father     Social History: no tobacco, no ETOH  Labs: Lipid Panel  09/18/23 TC 214, TG 202, HDL 51, LDL-C 127    Component Value Date/Time   CHOL 171 06/14/2022 1019   TRIG 155 (H) 06/14/2022 1019   HDL 38 (L) 06/14/2022 1019   CHOLHDL 4.5 (H) 06/14/2022 1019   CHOLHDL 2.9 11/30/2021 0323   VLDL 16 11/30/2021 0323   LDLCALC 106 (H) 06/14/2022 1019   LABVLDL 27 06/14/2022 1019     Past Medical History:  Diagnosis Date   Arthritis    Diabetes (HCC)     Current Outpatient Medications on File Prior to Visit  Medication Sig Dispense Refill   albuterol  (VENTOLIN  HFA) 108 (90 Base) MCG/ACT inhaler Inhale 2 puffs into the lungs every 6 (six) hours as needed for wheezing or shortness of breath. 2 each 11   ALPRAZolam  (XANAX ) 0.5 MG tablet Take 0.5 mg by mouth 3 (three) times daily as needed.     aspirin  EC 81 MG tablet Take 1 tablet (81 mg total) by mouth daily. Swallow whole. (Patient taking differently: Take 81 mg by mouth daily. Swallow whole. As needed) 30 tablet 0   benzonatate  (TESSALON ) 200 MG capsule Take 1 capsule (200 mg total) by mouth 3 (three) times daily as needed for cough. 30 capsule 1   glimepiride (AMARYL) 4 MG tablet Take 4 mg by mouth daily. (Patient taking differently: Take 2 mg by mouth daily. Per patient taking 2 mg daily)     losartan (COZAAR) 50 MG tablet  (Patient taking differently: daily.)  11   metFORMIN (GLUCOPHAGE) 500 MG tablet Take 500 mg by mouth at bedtime.     misoprostol  (CYTOTEC ) 100 MCG tablet Take 100 mcg by mouth daily as needed (For Inflammation per spouse).     nitrofurantoin, macrocrystal-monohydrate, (MACROBID) 100 MG capsule  Take 100 mg by mouth daily. (Patient taking differently: Take 100 mg by mouth daily. As needed)     OVER THE COUNTER MEDICATION Slenderice     oxymetazoline  (AFRIN) 0.05 % nasal spray Place 1 spray into both nostrils 2 (two) times daily. (Patient taking differently: Place 1 spray into both nostrils as needed.) 30 mL 0   pantoprazole  (PROTONIX ) 40 MG tablet Take 40 mg by mouth daily.     potassium chloride  (KLOR-CON ) 10 MEQ tablet Take 1 tablet (10 mEq total) by mouth daily. (Patient not taking: Reported on 09/28/2023) 3 tablet 0   verapamil  (CALAN -SR) 120 MG CR tablet   11   No current facility-administered medications on file prior to visit.    Allergies  Allergen Reactions   Atorvastatin Other (See  Comments)    SOB   Bactrim [Sulfamethoxazole-Trimethoprim] Other (See Comments)    Mouth will be like metal   Celebrex [Celecoxib] Diarrhea   Codeine Nausea And Vomiting   Dilaudid [Hydromorphone Hcl] Nausea And Vomiting   Dilaudid [Hydromorphone]     Other reaction(s): Not available   Hydrochlorothiazide Other (See Comments)    CANNOT VOID   Meloxicam Other (See Comments)    FLU LIKE SYMPTOMS   Morphine And Codeine Nausea And Vomiting   Tape     Other reaction(s): Not available   Tramadol     unknown   Wound Dressing Adhesive    Amoxicillin-Pot Clavulanate Diarrhea   Other Hives and Rash    emysin   Wellbutrin [Bupropion] Swelling and Rash    Assessment/Plan:  1. Hyperlipidemia -  Hyperlipidemia Assessment: LDL-C above goal  Currently on no therapy due to intolerance to atorvastatin and rosuvastatin Recent cath with  mild non-obstructive CAD Reviewed options for lowering LDL cholesterol, including ezetimibe, PCSK-9 inhibitors, bempedoic acid.  Discussed mechanisms of action, dosing, side effects and potential decreases in LDL cholesterol.  Also reviewed cost information and potential options for copay cards. Injection technique for Repatha reviewed. Patient willing to try Repatha  Plan: Submit PA for Repatha Labs in 3 month    Thank you,  Onika Gudiel D Yacoub Diltz, Pharm.JONETTA SARAN, CPP [facility] [address], [place], [place] [address]  Phone: [phone]; Fax: [phone]

## 2023-10-24 NOTE — Patient Instructions (Signed)

## 2023-10-24 NOTE — Assessment & Plan Note (Signed)
 Assessment: LDL-C above goal  Currently on no therapy due to intolerance to atorvastatin and rosuvastatin Recent cath with  mild non-obstructive CAD Reviewed options for lowering LDL cholesterol, including ezetimibe, PCSK-9 inhibitors, bempedoic acid.  Discussed mechanisms of action, dosing, side effects and potential decreases in LDL cholesterol.  Also reviewed cost information and potential options for copay cards. Injection technique for Repatha reviewed. Patient willing to try Repatha  Plan: Submit PA for Repatha Labs in 3 month

## 2023-10-24 NOTE — Telephone Encounter (Signed)
-----   Message from Eleanor JONETTA Crews sent at 10/24/2023  1:26 PM EDT ----- Please do PA for Repatha

## 2023-10-26 ENCOUNTER — Other Ambulatory Visit (HOSPITAL_COMMUNITY): Payer: Self-pay

## 2023-10-26 NOTE — Telephone Encounter (Signed)
 Pharmacy Patient Advocate Encounter  Received notification from [employer] that Prior Authorization for REPATHA has been APPROVED from 10/24/23 to 10/24/24. Ran test claim, Copay is $24.99 (EVOUCHER APPLIED AT [facility]). This test claim was processed through [facility]- copay amounts may vary at other pharmacies due to pharmacy/plan contracts, or as the patient moves through the different stages of their insurance plan.    PLEASE NOTE COPAY WILL BE $243.89 MORE AT AN OUTSIDE PHARMACY. EVOUCHER IN [facility] PAID $243.89 TOWARDS COPAY

## 2023-10-27 MED ORDER — REPATHA SURECLICK 140 MG/ML ~~LOC~~ SOAJ: 1.0000 mL | SUBCUTANEOUS | 11 refills | Status: DC

## 2023-10-27 NOTE — Addendum Note (Signed)
 Addended by: Keane Martelli D on: 10/27/2023 11:56 AM   Modules accepted: Orders

## 2023-10-27 NOTE — Telephone Encounter (Signed)
 Patient is calling stating she saw this medication can raise blood sugar. She is requesting a cb to discuss with Melissa due to stating she has worked really hard to get off diabetes medication and does not want to start this and have to go back on it.   Please advise.

## 2023-10-27 NOTE — Telephone Encounter (Signed)
 Pt made aware of approval. Request rx be sent to [place]. Advised on copay card Labs in 3 months- orders are already in

## 2023-10-30 NOTE — Telephone Encounter (Signed)
 Called pt and discussed. She is willing to try medication and will stop if she thinks its affecting her blood sugar.

## 2024-01-08 ENCOUNTER — Other Ambulatory Visit: Payer: Self-pay

## 2024-01-08 ENCOUNTER — Ambulatory Visit: Admitting: Orthopedic Surgery

## 2024-01-08 DIAGNOSIS — M7551 Bursitis of right shoulder: Secondary | ICD-10-CM | POA: Diagnosis not present

## 2024-01-08 DIAGNOSIS — M79642 Pain in left hand: Secondary | ICD-10-CM

## 2024-01-08 DIAGNOSIS — M65331 Trigger finger, right middle finger: Secondary | ICD-10-CM | POA: Diagnosis not present

## 2024-01-08 DIAGNOSIS — R200 Anesthesia of skin: Secondary | ICD-10-CM | POA: Diagnosis not present

## 2024-01-08 DIAGNOSIS — M25511 Pain in right shoulder: Secondary | ICD-10-CM | POA: Diagnosis not present

## 2024-01-08 DIAGNOSIS — M79641 Pain in right hand: Secondary | ICD-10-CM | POA: Diagnosis not present

## 2024-01-10 ENCOUNTER — Other Ambulatory Visit: Payer: Self-pay

## 2024-01-10 ENCOUNTER — Encounter: Payer: Self-pay | Admitting: Orthopedic Surgery

## 2024-01-10 ENCOUNTER — Ambulatory Visit: Admitting: Orthopedic Surgery

## 2024-01-10 DIAGNOSIS — M25562 Pain in left knee: Secondary | ICD-10-CM | POA: Diagnosis not present

## 2024-01-10 DIAGNOSIS — M25552 Pain in left hip: Secondary | ICD-10-CM

## 2024-01-10 NOTE — Progress Notes (Unsigned)
 Office Visit Note   Patient: Alejandra [name]           Date of Birth: 1960-07-28           MRN: [id number] Visit Date: 01/08/2024 Requested by: Leonel Cole, MD [address] [place],  [place] [address] PCP: Leonel Cole, MD  Subjective: Chief Complaint  Patient presents with   Other    Bilateral hand pain, weakness, swelling, numbness, dropping things Right shoulder pain with limited ROM    HPI: Alejandra [name] is a 63 y.o. female who presents to the office reporting right greater than left hand pain and trigger finger on the middle finger.  Reports bilateral hand stiffness with decreased strength and locking.  Patient is dropping things.  Describes pain mostly on the volar aspect of both hands.  Has some paresthesias in that median nerve distribution as well.  Denies much in the way of radicular pain.  Patient also reports right shoulder pain of 1 month history.  No known injury.  She feels like there is a knot in her shoulder.  She is right-hand dominant.  Pain wakes her from sleep at night.  Denies any neck pain.  She was seen about 2 years ago for frozen shoulder on the right.  That improved.  Patient also describes having septic pneumonia 2 years ago after COVID and she developed a frozen shoulder while recovering.  Uses Voltaren and Cytotec .  She works as a Interior and spatial designer.  Localizes most of her shoulder pain to the deltoid region..                ROS: All systems reviewed are negative as they relate to the chief complaint within the history of present illness.  Patient denies fevers or chills.  Assessment & Plan: Visit Diagnoses:  1. Right shoulder pain, unspecified chronicity   2. Bilateral hand pain   3. Bilateral hand numbness   4. Trigger finger, right middle finger     Plan: Impression is possible subacromial bursitis in the shoulder.  Range of motion reasonably well-maintained at this time.  She also has a trigger finger on the right middle finger which locks  up about 8-9 times a day for the past several months.  Plan at this time is subacromial space injection as well as trigger finger injection on the right-hand side.  EMG nerve study indicated to evaluate for bilateral carpal tunnel syndrome.  Follow-up after those studies.  Follow-Up Instructions: No follow-ups on file.   Orders:  Orders Placed This Encounter  Procedures   XR Shoulder Right   XR Hand Complete Right   XR Hand Complete Left   US  Guided Needle Placement - No Linked Charges   Ambulatory referral to Physical Medicine Rehab   No orders of the defined types were placed in this encounter.     Procedures: Large Joint Inj: R subacromial bursa on 01/08/2024 9:58 PM Indications: diagnostic evaluation and pain Details: 18 G 1.5 in needle, posterior approach  Arthrogram: No  Medications: 9 mL bupivacaine  0.5 %; 5 mL lidocaine  1 %; 40 mg triamcinolone  acetonide 40 MG/ML Outcome: tolerated well, no immediate complications Procedure, treatment alternatives, risks and benefits explained, specific risks discussed. Consent was given by the patient. Immediately prior to procedure a time out was called to verify the correct patient, procedure, equipment, support staff and site/side marked as required. Patient was prepped and draped in the usual sterile fashion.    Hand/UE Inj: R long A1  for trigger finger on 01/08/2024 10:01 PM Indications: therapeutic and pain Details: 25 G needle, ultrasound-guided volar approach Medications: 0.33 mL bupivacaine  0.25 %; 3 mL lidocaine  1 %; 13.33 mg triamcinolone  acetonide 40 MG/ML Outcome: tolerated well, no immediate complications Procedure, treatment alternatives, risks and benefits explained, specific risks discussed. Consent was given by the patient. Immediately prior to procedure a time out was called to verify the correct patient, procedure, equipment, support staff and site/side marked as required. Patient was prepped and draped in the usual sterile  fashion.       Clinical Data: No additional findings.  Objective: Vital Signs: There were no vitals taken for this visit.  Physical Exam:  Constitutional: Patient appears well-developed HEENT:  Head: Normocephalic Eyes:EOM are normal Neck: Normal range of motion Cardiovascular: Normal rate Pulmonary/chest: Effort normal Neurologic: Patient is alert Skin: Skin is warm Psychiatric: Patient has normal mood and affect  Ortho Exam: Ortho exam demonstrates no abductor pollicis brevis wasting bilaterally.  Negative Tinel's cubital tunnel bilaterally.  Range of motion of the elbow is full.  EPL FPL interosseous strength is intact.  Radial pulses intact.  Does have tenderness over the A1 pulley at the base of the long finger.  Triggering is present.  Positive carpal tunnel compression testing bilaterally.  Right shoulder demonstrates good rotator cuff strength infraspinatus supraspinatus and subscap muscle testing.  I do feel slight asymmetry around the deltoid contour but no discrete mass right shoulder versus left shoulder.  AC joint nontender right versus left.  Passive range of motion maintained bilaterally at 50/95/155.  No coarse grinding or crepitus with internal/external rotation of that right shoulder.  Specialty Comments:  No specialty comments available.  Imaging: No results found.   PMFS History: Patient Active Problem List   Diagnosis Date Noted   Hyperlipidemia 10/24/2023   Elevated troponin 02/06/2022   Morbid obesity (HCC) 02/06/2022   Acute diastolic HF (heart failure) (HCC) 02/06/2022   Acute kidney injury (HCC)    Acute respiratory failure with hypoxia (HCC)    Pneumonia due to COVID-19 virus 11/27/2021   Arthritis of left acromioclavicular joint 04/13/2016   Past Medical History:  Diagnosis Date   Arthritis    Diabetes (HCC)     Family History  Problem Relation Age of Onset   Hypertension Mother    Hypertension Father     Past Surgical History:   Procedure Laterality Date   LEFT HEART CATH AND CORONARY ANGIOGRAPHY N/A 09/29/2023   Procedure: LEFT HEART CATH AND CORONARY ANGIOGRAPHY;  Surgeon: Wonda Sharper, MD;  Location: [facility] INVASIVE CV LAB;  Service: Cardiovascular;  Laterality: N/A;   Social History   Occupational History   Not on file  Tobacco Use   Smoking status: Former   Smokeless tobacco: Never  Substance and Sexual Activity   Alcohol use: No   Drug use: No   Sexual activity: Not on file

## 2024-01-10 NOTE — Progress Notes (Signed)
 Office Visit Note   Patient: Alejandra [name]           Date of Birth: 09/23/1960           MRN: [id number] Visit Date: 01/10/2024 Requested by: Leonel Cole, MD [address] [place],  [place] [address] PCP: Leonel Cole, MD  Subjective: Chief Complaint  Patient presents with   Left Hip - Pain   Left Knee - Pain    HPI: Alejandra [name] is a 63 y.o. female who presents to the office reporting left hip and left knee pain.  Denies any history of injury.  Patient reports a little catching in the lateral aspect of the left trochanteric region.  Denies much in the way of groin pain.  Having some mild left knee pain as well.  Is able to sleep on both sides.  The pain in the left knee is primarily anterior.  She states that her right finger has locked only 1 since her injection 2 days ago..                ROS: All systems reviewed are negative as they relate to the chief complaint within the history of present illness.  Patient denies fevers or chills.  Assessment & Plan: Visit Diagnoses:  1. Pain in left hip   2. Left knee pain, unspecified chronicity     Plan: Impression is structurally intact knee and hip without arthritis.  Some enthesopathic changes noted on the trochanteric tip.  Plan at this time is activity as tolerated.  Hold off on injections for now.  Getting carpal tunnel nerve conduction studies in the near future.  Will see her back after that and decide for or against any type of further intervention  Follow-Up Instructions: No follow-ups on file.   Orders:  Orders Placed This Encounter  Procedures   XR HIP UNILAT W OR W/O PELVIS 2-3 VIEWS LEFT   XR Knee 1-2 Views Left   No orders of the defined types were placed in this encounter.     Procedures: No procedures performed   Clinical Data: No additional findings.  Objective: Vital Signs: There were no vitals taken for this visit.  Physical Exam:  Constitutional: Patient appears  well-developed HEENT:  Head: Normocephalic Eyes:EOM are normal Neck: Normal range of motion Cardiovascular: Normal rate Pulmonary/chest: Effort normal Neurologic: Patient is alert Skin: Skin is warm Psychiatric: Patient has normal mood and affect  Ortho Exam: Left knee exam demonstrates no effusion full range of motion intact extensor mechanism no groin pain with internal/external rotation of the leg and no nerve root tension signs.  Pedal pulses palpable.  Ankle dorsiflexion intact.  No other masses lymphadenopathy or skin changes noted in that left knee region mild tenderness bilaterally over the trochanters.  Minimal pain with forward and lateral bending.  Specialty Comments:  No specialty comments available.  Imaging: No results found.   PMFS History: Patient Active Problem List   Diagnosis Date Noted   Hyperlipidemia 10/24/2023   Elevated troponin 02/06/2022   Morbid obesity (HCC) 02/06/2022   Acute diastolic HF (heart failure) (HCC) 02/06/2022   Acute kidney injury (HCC)    Acute respiratory failure with hypoxia (HCC)    Pneumonia due to COVID-19 virus 11/27/2021   Arthritis of left acromioclavicular joint 04/13/2016   Past Medical History:  Diagnosis Date   Arthritis    Diabetes (HCC)     Family History  Problem Relation Age of Onset  Hypertension Mother    Hypertension Father     Past Surgical History:  Procedure Laterality Date   LEFT HEART CATH AND CORONARY ANGIOGRAPHY N/A 09/29/2023   Procedure: LEFT HEART CATH AND CORONARY ANGIOGRAPHY;  Surgeon: Wonda Sharper, MD;  Location: [facility] INVASIVE CV LAB;  Service: Cardiovascular;  Laterality: N/A;   Social History   Occupational History   Not on file  Tobacco Use   Smoking status: Former   Smokeless tobacco: Never  Substance and Sexual Activity   Alcohol use: No   Drug use: No   Sexual activity: Not on file

## 2024-01-11 MED ORDER — BUPIVACAINE HCL 0.5 % IJ SOLN
9.0000 mL | INTRAMUSCULAR | Status: AC | PRN
  Administered 2024-01-08: 9 mL via INTRA_ARTICULAR

## 2024-01-11 MED ORDER — LIDOCAINE HCL 1 % IJ SOLN
5.0000 mL | INTRAMUSCULAR | Status: AC | PRN
  Administered 2024-01-08: 5 mL

## 2024-01-11 MED ORDER — BUPIVACAINE HCL 0.25 % IJ SOLN
0.3300 mL | INTRAMUSCULAR | Status: AC | PRN
  Administered 2024-01-08: .33 mL

## 2024-01-11 MED ORDER — TRIAMCINOLONE ACETONIDE 40 MG/ML IJ SUSP
40.0000 mg | INTRAMUSCULAR | Status: AC | PRN
  Administered 2024-01-08: 40 mg via INTRA_ARTICULAR

## 2024-01-11 MED ORDER — TRIAMCINOLONE ACETONIDE 40 MG/ML IJ SUSP
13.3300 mg | INTRAMUSCULAR | Status: AC | PRN
  Administered 2024-01-08: 13.33 mg

## 2024-01-11 MED ORDER — LIDOCAINE HCL 1 % IJ SOLN
3.0000 mL | INTRAMUSCULAR | Status: AC | PRN
  Administered 2024-01-08: 3 mL

## 2024-01-31 ENCOUNTER — Encounter: Payer: Self-pay | Admitting: Physical Medicine and Rehabilitation

## 2024-01-31 ENCOUNTER — Ambulatory Visit: Admitting: Physical Medicine and Rehabilitation

## 2024-01-31 ENCOUNTER — Telehealth: Payer: Self-pay | Admitting: Orthopedic Surgery

## 2024-01-31 DIAGNOSIS — Z9981 Dependence on supplemental oxygen: Secondary | ICD-10-CM | POA: Insufficient documentation

## 2024-01-31 DIAGNOSIS — M79641 Pain in right hand: Secondary | ICD-10-CM | POA: Diagnosis not present

## 2024-01-31 DIAGNOSIS — R202 Paresthesia of skin: Secondary | ICD-10-CM

## 2024-01-31 DIAGNOSIS — M545 Low back pain, unspecified: Secondary | ICD-10-CM | POA: Insufficient documentation

## 2024-01-31 DIAGNOSIS — M25511 Pain in right shoulder: Secondary | ICD-10-CM

## 2024-01-31 DIAGNOSIS — E1169 Type 2 diabetes mellitus with other specified complication: Secondary | ICD-10-CM | POA: Insufficient documentation

## 2024-01-31 DIAGNOSIS — R29898 Other symptoms and signs involving the musculoskeletal system: Secondary | ICD-10-CM

## 2024-01-31 DIAGNOSIS — R0600 Dyspnea, unspecified: Secondary | ICD-10-CM | POA: Insufficient documentation

## 2024-01-31 DIAGNOSIS — R5383 Other fatigue: Secondary | ICD-10-CM | POA: Insufficient documentation

## 2024-01-31 DIAGNOSIS — E7800 Pure hypercholesterolemia, unspecified: Secondary | ICD-10-CM | POA: Insufficient documentation

## 2024-01-31 DIAGNOSIS — I5030 Unspecified diastolic (congestive) heart failure: Secondary | ICD-10-CM | POA: Insufficient documentation

## 2024-01-31 DIAGNOSIS — K219 Gastro-esophageal reflux disease without esophagitis: Secondary | ICD-10-CM | POA: Insufficient documentation

## 2024-01-31 DIAGNOSIS — M79642 Pain in left hand: Secondary | ICD-10-CM

## 2024-01-31 DIAGNOSIS — F419 Anxiety disorder, unspecified: Secondary | ICD-10-CM | POA: Insufficient documentation

## 2024-01-31 NOTE — Progress Notes (Signed)
 Pain Scale   Average Pain 2 Patient advising she has chronic bilateral hand pain. Patient advising she is Right hand dominate.        +Driver, -BT, -Dye Allergies.

## 2024-01-31 NOTE — Telephone Encounter (Signed)
 Pt states she has been hurting worse since the injection and wants to know if she should go ahead with getting an ct scan or what's her next option. Please call ASAP

## 2024-01-31 NOTE — Telephone Encounter (Signed)
 I called and talked to the pt. She is talking about her right shoulder. Feels like frozen shoulder per pt.

## 2024-01-31 NOTE — Telephone Encounter (Signed)
 Can you figure out which joint is hurting.  She was hurting in her knee and hip last office visit.  Thanks

## 2024-01-31 NOTE — Progress Notes (Signed)
 Alejandra [name] - 63 y.o. female MRN [id number]  Date of birth: 10-Feb-1961  Office Visit Note: Visit Date: 01/31/2024 PCP: Leonel Cole, MD Referred by: Alejandra Cordella Hamilton, MD  Subjective: Chief Complaint  Patient presents with   Right Hand - Pain   Left Hand - Pain   HPI: Alejandra [name] is a 63 y.o. female who comes in today at the request of Dr. JUDITHANN [name] Alejandra for evaluation and management of chronic, worsening and severe pain, numbness and tingling in the Bilateral upper extremities.  Patient is Right hand dominant.  She essentially reports a history of worsening right shoulder pain that was initially a frozen shoulder.  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  This is right greater than left hand pain.  She gets a trigger finger in the middle finger on the left.  She reports a lot of stiffness in both hands with weakness of grip strength.  She gets a lot of locking up of the hands.  She is dropping things now which is difficult working as a Interior and spatial designer.  She denies any symptoms down the arms or frank neck pain.  Her case is complicated by diagnosis of diabetes and anxiety.  She has tried medications but is intolerant to multiple pain medications and anti-inflammatories.  She has tried splints without relief.  No prior electrodiagnostic study.   I spent more than 30 minutes speaking face-to-face with the patient with 50% of the time in counseling and discussing coordination of care.       Review of Systems  Musculoskeletal:  Positive for joint pain.  Neurological:  Positive for tingling and focal weakness.  All other systems reviewed and are negative.  Otherwise per HPI.  Assessment & Plan: Visit Diagnoses:    ICD-10-CM   1. Paresthesia of skin  R20.2 NCV with EMG (electromyography)    2. Bilateral hand pain  M79.641    M79.642     3. Weakness of both hands  R29.898     4. Right shoulder pain, unspecified chronicity  M25.511         Plan: Impression: Essentially NORMAL electrodiagnostic study of both upper limbs.  There is no significant electrodiagnostic evidence of nerve entrapment, brachial plexopathy or cervical radiculopathy.    As you know, purely sensory or demyelinating radiculopathies and chemical radiculitis may not be detected with this particular electrodiagnostic study. **This electrodiagnostic study cannot rule out small fiber polyneuropathy and dysesthesias from central pain syndromes such as stroke or central pain sensitization syndromes such as fibromyalgia.  Myotomal referral pain from trigger points is also not excluded.  Recommendations: 1.  Follow-up with referring physician. 2.  Continue current management of symptoms.  Meds & Orders: No orders of the defined types were placed in this encounter.   Orders Placed This Encounter  Procedures   NCV with EMG (electromyography)    Follow-up: Return for G. [name] Addie, MD.   Procedures: No procedures performed  EMG & NCV Findings: Evaluation of the right median motor nerve showed reduced amplitude (4.9 mV).  All remaining nerves (as indicated in the following tables) were within normal limits.  Left vs. Right side comparison data for the ulnar motor nerve indicates abnormal L-R amplitude difference (33.0 %).  All remaining left vs. right side differences were within normal limits.    All examined muscles (as indicated in the following table) showed no evidence of electrical instability.    Impression: Essentially NORMAL electrodiagnostic  study of both upper limbs.  There is no significant electrodiagnostic evidence of nerve entrapment, brachial plexopathy or cervical radiculopathy.    As you know, purely sensory or demyelinating radiculopathies and chemical radiculitis may not be detected with this particular electrodiagnostic study. **This electrodiagnostic study cannot rule out small fiber polyneuropathy and dysesthesias from central pain syndromes  such as stroke or central pain sensitization syndromes such as fibromyalgia.  Myotomal referral pain from trigger points is also not excluded.  Recommendations: 1.  Follow-up with referring physician. 2.  Continue current management of symptoms.  ___________________________ Prentice Masters FAAPMR Board Certified, American Board of Physical Medicine and Rehabilitation    Nerve Conduction Studies Anti Sensory Summary Table   Stim Site NR Peak (ms) Norm Peak (ms) P-T Amp (V) Norm P-T Amp Site1 Site2 Delta-P (ms) Dist (cm) Vel (m/s) Norm Vel (m/s)  Left Median Acr Palm Anti Sensory (2nd Digit)  31.6C  Wrist    3.3 <3.6 22.4 >10 Wrist Palm 1.7 0.0    Palm    1.6 <2.0 4.7         Right Median Acr Palm Anti Sensory (2nd Digit)  31.2C  Wrist    3.2 <3.6 31.4 >10 Wrist Palm 1.6 0.0    Palm    1.6 <2.0 25.9         Left Radial Anti Sensory (Base 1st Digit)  31.3C  Wrist    2.0 <3.1 42.3  Wrist Base 1st Digit 2.0 0.0    Right Radial Anti Sensory (Base 1st Digit)  30.7C  Wrist    2.1 <3.1 25.4  Wrist Base 1st Digit 2.1 0.0    Left Ulnar Anti Sensory (5th Digit)  31.8C  Wrist    3.1 <3.7 15.1 >15.0 Wrist 5th Digit 3.1 14.0 45 >38  Right Ulnar Anti Sensory (5th Digit)  31.5C  Wrist    3.0 <3.7 37.8 >15.0 Wrist 5th Digit 3.0 14.0 47 >38   Motor Summary Table   Stim Site NR Onset (ms) Norm Onset (ms) O-P Amp (mV) Norm O-P Amp Site1 Site2 Delta-0 (ms) Dist (cm) Vel (m/s) Norm Vel (m/s)  Left Median Motor (Abd Poll Brev)  31.6C  Wrist    3.2 <4.2 8.7 >5 Elbow Wrist 3.5 18.0 51 >50  Elbow    6.7  6.0         Right Median Motor (Abd Poll Brev)  30.9C  Wrist    3.3 <4.2 *4.9 >5 Elbow Wrist 3.4 18.0 53 >50  Elbow    6.7  3.4         Left Ulnar Motor (Abd Dig Min)  31.7C  Wrist    2.9 <4.2 6.3 >3 B Elbow Wrist 3.4 18.0 53 >53  B Elbow    6.3  7.5  A Elbow B Elbow 1.9 10.0 53 >53  A Elbow    8.2  7.8         Right Ulnar Motor (Abd Dig Min)  31.2C  Wrist    2.9 <4.2 9.4 >3 B Elbow Wrist  3.4 18.0 53 >53  B Elbow    6.3  8.9  A Elbow B Elbow 1.8 10.0 56 >53  A Elbow    8.1  8.5          EMG   Side Muscle Nerve Root Ins Act Fibs Psw Amp Dur Poly Recrt Int Bruna Comment  Right Abd Poll Brev Median C8-T1 Nml Nml Nml Nml Nml 0 Nml Nml   Right  1stDorInt Ulnar C8-T1 Nml Nml Nml Nml Nml 0 Nml Nml   Right PronatorTeres Median C6-7 Nml Nml Nml Nml Nml 0 Nml Nml   Right Biceps Musculocut C5-6 Nml Nml Nml Nml Nml 0 Nml Nml   Right Deltoid Axillary C5-6 Nml Nml Nml Nml Nml 0 Nml Nml     Nerve Conduction Studies Anti Sensory Left/Right Comparison   Stim Site L Lat (ms) R Lat (ms) L-R Lat (ms) L Amp (V) R Amp (V) L-R Amp (%) Site1 Site2 L Vel (m/s) R Vel (m/s) L-R Vel (m/s)  Median Acr Palm Anti Sensory (2nd Digit)  31.6C  Wrist 3.3 3.2 0.1 22.4 31.4 28.7 Wrist Palm     Palm 1.6 1.6 0.0 4.7 25.9 81.9       Radial Anti Sensory (Base 1st Digit)  31.3C  Wrist 2.0 2.1 0.1 42.3 25.4 40.0 Wrist Base 1st Digit     Ulnar Anti Sensory (5th Digit)  31.8C  Wrist 3.1 3.0 0.1 15.1 37.8 60.1 Wrist 5th Digit 45 47 2   Motor Left/Right Comparison   Stim Site L Lat (ms) R Lat (ms) L-R Lat (ms) L Amp (mV) R Amp (mV) L-R Amp (%) Site1 Site2 L Vel (m/s) R Vel (m/s) L-R Vel (m/s)  Median Motor (Abd Poll Brev)  31.6C  Wrist 3.2 3.3 0.1 8.7 *4.9 43.7 Elbow Wrist 51 53 2  Elbow 6.7 6.7 0.0 6.0 3.4 43.3       Ulnar Motor (Abd Dig Min)  31.7C  Wrist 2.9 2.9 0.0 6.3 9.4 *33.0 B Elbow Wrist 53 53 0  B Elbow 6.3 6.3 0.0 7.5 8.9 15.7 A Elbow B Elbow 53 56 3  A Elbow 8.2 8.1 0.1 7.8 8.5 8.2          Waveforms:                     Clinical History: No specialty comments available.   She reports that she has quit smoking. She has never used smokeless tobacco. No results for input(s): HGBA1C, LABURIC in the last 8760 hours.  Objective:  VS:  HT:    WT:   BMI:     BP:   HR: bpm  TEMP: ( )  RESP:  Physical Exam Vitals and nursing note reviewed.  Constitutional:      General:  She is not in acute distress.    Appearance: Normal appearance. She is well-developed. She is obese. She is not ill-appearing.  HENT:     Head: Normocephalic and atraumatic.  Eyes:     Conjunctiva/sclera: Conjunctivae normal.     Pupils: Pupils are equal, round, and reactive to light.  Cardiovascular:     Rate and Rhythm: Normal rate.     Pulses: Normal pulses.  Pulmonary:     Effort: Pulmonary effort is normal.  Musculoskeletal:        General: Tenderness present. No swelling or deformity.     Right lower leg: No edema.     Left lower leg: No edema.     Comments: Inspection reveals no atrophy of the bilateral APB or FDI or hand intrinsics. There is no swelling, color changes, allodynia or dystrophic changes. There is 5 out of 5 strength in the bilateral wrist extension, finger abduction and long finger flexion. There is intact sensation to light touch in all dermatomal and peripheral nerve distributions. There is a negative Froment's test bilaterally. There is a negative Tinel's test at the bilateral wrist and  elbow. There is a negative Phalen's test bilaterally. There is a negative Hoffmann's test bilaterally.  Skin:    General: Skin is warm and dry.     Findings: No erythema or rash.  Neurological:     General: No focal deficit present.     Mental Status: She is alert and oriented to person, place, and time.     Cranial Nerves: No cranial nerve deficit.     Sensory: No sensory deficit.     Motor: No weakness or abnormal muscle tone.     Coordination: Coordination normal.     Gait: Gait normal.  Psychiatric:        Mood and Affect: Mood normal.        Behavior: Behavior normal.     Ortho Exam  Imaging: No results found.  Past Medical/Family/Surgical/Social History: Medications & Allergies reviewed per EMR, new medications updated. Patient Active Problem List   Diagnosis Date Noted   Anxiety 01/31/2024   Dyspnea 01/31/2024   Fatigue 01/31/2024   Gastro-esophageal reflux  disease without esophagitis 01/31/2024   Heart failure with preserved ejection fraction (HCC) 01/31/2024   Low back pain 01/31/2024   Oxygen dependent 01/31/2024   Pure hypercholesterolemia 01/31/2024   Type 2 diabetes mellitus with other specified complication (HCC) 01/31/2024   Hyperlipidemia 10/24/2023   Elevated troponin 02/06/2022   Morbid obesity (HCC) 02/06/2022   Acute diastolic HF (heart failure) (HCC) 02/06/2022   Acute kidney injury    Acute respiratory failure with hypoxia (HCC)    Pneumonia due to COVID-19 virus 11/27/2021   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 INVASIVE CV LAB;  Service: Cardiovascular;  Laterality: N/A;   Social History   Occupational History   Not on file  Tobacco Use   Smoking status: Former   Smokeless tobacco: Never  Substance and Sexual Activity   Alcohol use: No   Drug use: No   Sexual activity: Not on file

## 2024-02-01 NOTE — Telephone Encounter (Signed)
Called and advised pt. She stated understanding  

## 2024-02-01 NOTE — Telephone Encounter (Signed)
 Ok for mri arthrogram r shoulder to eval progression of ac oa and calcific tendonitis thx

## 2024-02-01 NOTE — Telephone Encounter (Signed)
 Mri ordered

## 2024-02-05 ENCOUNTER — Other Ambulatory Visit: Payer: Self-pay | Admitting: Family Medicine

## 2024-02-05 DIAGNOSIS — Z1231 Encounter for screening mammogram for malignant neoplasm of breast: Secondary | ICD-10-CM

## 2024-02-06 ENCOUNTER — Encounter: Payer: Self-pay | Admitting: Pharmacist

## 2024-02-06 NOTE — Procedures (Unsigned)
 EMG & NCV Findings: Evaluation of the right median motor nerve showed reduced amplitude (4.9 mV).  All remaining nerves (as indicated in the following tables) were within normal limits.  Left vs. Right side comparison data for the ulnar motor nerve indicates abnormal L-R amplitude difference (33.0 %).  All remaining left vs. right side differences were within normal limits.    All examined muscles (as indicated in the following table) showed no evidence of electrical instability.    Impression: Essentially NORMAL electrodiagnostic study of both upper limbs.  There is no significant electrodiagnostic evidence of nerve entrapment, brachial plexopathy or cervical radiculopathy.    As you know, purely sensory or demyelinating radiculopathies and chemical radiculitis may not be detected with this particular electrodiagnostic study.  Recommendations: 1.  Follow-up with referring physician. 2.  Continue current management of symptoms.  ___________________________ Prentice Masters FAAPMR Board Certified, American Board of Physical Medicine and Rehabilitation    Nerve Conduction Studies Anti Sensory Summary Table   Stim Site NR Peak (ms) Norm Peak (ms) P-T Amp (V) Norm P-T Amp Site1 Site2 Delta-P (ms) Dist (cm) Vel (m/s) Norm Vel (m/s)  Left Median Acr Palm Anti Sensory (2nd Digit)  31.6C  Wrist    3.3 <3.6 22.4 >10 Wrist Palm 1.7 0.0    Palm    1.6 <2.0 4.7         Right Median Acr Palm Anti Sensory (2nd Digit)  31.2C  Wrist    3.2 <3.6 31.4 >10 Wrist Palm 1.6 0.0    Palm    1.6 <2.0 25.9         Left Radial Anti Sensory (Base 1st Digit)  31.3C  Wrist    2.0 <3.1 42.3  Wrist Base 1st Digit 2.0 0.0    Right Radial Anti Sensory (Base 1st Digit)  30.7C  Wrist    2.1 <3.1 25.4  Wrist Base 1st Digit 2.1 0.0    Left Ulnar Anti Sensory (5th Digit)  31.8C  Wrist    3.1 <3.7 15.1 >15.0 Wrist 5th Digit 3.1 14.0 45 >38  Right Ulnar Anti Sensory (5th Digit)  31.5C  Wrist    3.0 <3.7 37.8 >15.0  Wrist 5th Digit 3.0 14.0 47 >38   Motor Summary Table   Stim Site NR Onset (ms) Norm Onset (ms) O-P Amp (mV) Norm O-P Amp Site1 Site2 Delta-0 (ms) Dist (cm) Vel (m/s) Norm Vel (m/s)  Left Median Motor (Abd Poll Brev)  31.6C  Wrist    3.2 <4.2 8.7 >5 Elbow Wrist 3.5 18.0 51 >50  Elbow    6.7  6.0         Right Median Motor (Abd Poll Brev)  30.9C  Wrist    3.3 <4.2 *4.9 >5 Elbow Wrist 3.4 18.0 53 >50  Elbow    6.7  3.4         Left Ulnar Motor (Abd Dig Min)  31.7C  Wrist    2.9 <4.2 6.3 >3 B Elbow Wrist 3.4 18.0 53 >53  B Elbow    6.3  7.5  A Elbow B Elbow 1.9 10.0 53 >53  A Elbow    8.2  7.8         Right Ulnar Motor (Abd Dig Min)  31.2C  Wrist    2.9 <4.2 9.4 >3 B Elbow Wrist 3.4 18.0 53 >53  B Elbow    6.3  8.9  A Elbow B Elbow 1.8 10.0 56 >53  A Elbow  8.1  8.5          EMG   Side Muscle Nerve Root Ins Act Fibs Psw Amp Dur Poly Recrt Int Bruna Comment  Right Abd Poll Brev Median C8-T1 Nml Nml Nml Nml Nml 0 Nml Nml   Right 1stDorInt Ulnar C8-T1 Nml Nml Nml Nml Nml 0 Nml Nml   Right PronatorTeres Median C6-7 Nml Nml Nml Nml Nml 0 Nml Nml   Right Biceps Musculocut C5-6 Nml Nml Nml Nml Nml 0 Nml Nml   Right Deltoid Axillary C5-6 Nml Nml Nml Nml Nml 0 Nml Nml     Nerve Conduction Studies Anti Sensory Left/Right Comparison   Stim Site L Lat (ms) R Lat (ms) L-R Lat (ms) L Amp (V) R Amp (V) L-R Amp (%) Site1 Site2 L Vel (m/s) R Vel (m/s) L-R Vel (m/s)  Median Acr Palm Anti Sensory (2nd Digit)  31.6C  Wrist 3.3 3.2 0.1 22.4 31.4 28.7 Wrist Palm     Palm 1.6 1.6 0.0 4.7 25.9 81.9       Radial Anti Sensory (Base 1st Digit)  31.3C  Wrist 2.0 2.1 0.1 42.3 25.4 40.0 Wrist Base 1st Digit     Ulnar Anti Sensory (5th Digit)  31.8C  Wrist 3.1 3.0 0.1 15.1 37.8 60.1 Wrist 5th Digit 45 47 2   Motor Left/Right Comparison   Stim Site L Lat (ms) R Lat (ms) L-R Lat (ms) L Amp (mV) R Amp (mV) L-R Amp (%) Site1 Site2 L Vel (m/s) R Vel (m/s) L-R Vel (m/s)  Median Motor (Abd Poll Brev)   31.6C  Wrist 3.2 3.3 0.1 8.7 *4.9 43.7 Elbow Wrist 51 53 2  Elbow 6.7 6.7 0.0 6.0 3.4 43.3       Ulnar Motor (Abd Dig Min)  31.7C  Wrist 2.9 2.9 0.0 6.3 9.4 *33.0 B Elbow Wrist 53 53 0  B Elbow 6.3 6.3 0.0 7.5 8.9 15.7 A Elbow B Elbow 53 56 3  A Elbow 8.2 8.1 0.1 7.8 8.5 8.2          Waveforms:

## 2024-02-13 ENCOUNTER — Ambulatory Visit
Admission: RE | Admit: 2024-02-13 | Discharge: 2024-02-13 | Disposition: A | Discharge status: Home / Self Care | Source: Ambulatory Visit | Attending: Family Medicine | Admitting: Family Medicine

## 2024-02-13 DIAGNOSIS — Z1231 Encounter for screening mammogram for malignant neoplasm of breast: Secondary | ICD-10-CM

## 2024-02-14 ENCOUNTER — Ambulatory Visit
Admission: RE | Admit: 2024-02-14 | Discharge: 2024-02-14 | Disposition: A | Discharge status: Home / Self Care | Source: Ambulatory Visit | Attending: Orthopedic Surgery | Admitting: Orthopedic Surgery

## 2024-02-14 DIAGNOSIS — M25511 Pain in right shoulder: Secondary | ICD-10-CM

## 2024-02-14 MED ORDER — IOPAMIDOL (ISOVUE-M 200) INJECTION 41%
10.0000 mL | Freq: Once | INTRAMUSCULAR | Status: AC
  Administered 2024-02-14: 10 mL via INTRA_ARTICULAR

## 2024-02-26 ENCOUNTER — Ambulatory Visit (HOSPITAL_COMMUNITY)
Admission: RE | Admit: 2024-02-26 | Discharge: 2024-02-26 | Disposition: A | Discharge status: Home / Self Care | Source: Ambulatory Visit | Attending: Cardiology | Admitting: Cardiology

## 2024-02-26 DIAGNOSIS — I340 Nonrheumatic mitral (valve) insufficiency: Secondary | ICD-10-CM | POA: Diagnosis present

## 2024-02-26 LAB — ECHOCARDIOGRAM COMPLETE
AR max vel: 1.37 cm2
AV Area VTI: 1.44 cm2
AV Area mean vel: 1.37 cm2
AV Mean grad: 8 mmHg
AV Peak grad: 16.9 mmHg
Ao pk vel: 2.05 m/s
Area-P 1/2: 3.32 cm2
S' Lateral: 2.7 cm

## 2024-02-28 ENCOUNTER — Ambulatory Visit: Payer: Self-pay | Admitting: Physician Assistant

## 2024-02-29 ENCOUNTER — Telehealth: Payer: Self-pay

## 2024-02-29 ENCOUNTER — Ambulatory Visit: Admitting: Orthopedic Surgery

## 2024-02-29 ENCOUNTER — Encounter: Payer: Self-pay | Admitting: Orthopedic Surgery

## 2024-02-29 DIAGNOSIS — M79642 Pain in left hand: Secondary | ICD-10-CM

## 2024-02-29 DIAGNOSIS — M25511 Pain in right shoulder: Secondary | ICD-10-CM | POA: Diagnosis not present

## 2024-02-29 DIAGNOSIS — M79641 Pain in right hand: Secondary | ICD-10-CM | POA: Diagnosis not present

## 2024-02-29 DIAGNOSIS — R202 Paresthesia of skin: Secondary | ICD-10-CM

## 2024-02-29 DIAGNOSIS — M65331 Trigger finger, right middle finger: Secondary | ICD-10-CM

## 2024-02-29 NOTE — Telephone Encounter (Signed)
 Dr Addie would like for Dr Burnetta to see patient for shockwave with ultrasound guided decompression of calc tend.

## 2024-02-29 NOTE — Progress Notes (Signed)
 Office Visit Note   Patient: Alejandra [name]           Date of Birth: 14-Nov-1960           MRN: [id number] Visit Date: 02/29/2024 Requested by: Leonel Cole, MD [address] [place],  [place] [address] PCP: Leonel Cole, MD  Subjective: Chief Complaint  Patient presents with   Right Shoulder - Follow-up    MRI review    HPI: Alejandra [name] is a 63 y.o. female who presents to the office reporting Continued right shoulder pain.  Since she was last seen she has had an MRI scan which shows mild to moderate AC degenerative changes along with chronic hydroxyapatite deposition distally in the supraspinatus tendon consistent with calcific tendinitis.  No rotator cuff tear or atrophy.  Biceps tendon appears intact and there is no other issues in the shoulder.  Patient states she is ready to fix this.  She has difficulty with ADLs.  She is struggling to work.  Has tried over-the-counter medications without relief.  Denies much in the way of radicular symptoms..                ROS: All systems reviewed are negative as they relate to the chief complaint within the history of present illness.  Patient denies fevers or chills.  Assessment & Plan: Visit Diagnoses:  1. Right shoulder pain, unspecified chronicity   2. Paresthesia of skin   3. Bilateral hand pain   4. Trigger finger, right middle finger     Plan: Impression is right shoulder pain with still a small possibility this could be radicular in origin.  Patient is very keen to get this fixed by the end of the year.  Her previous radiograph from several years ago did show calcific tendinitis at that time but the lesion is now a little bit bigger and more matured.  I think it is possible that most of her symptoms could be coming from this area of calcific tendinitis in the tendon.  She is also having right fourth trigger finger issues.  If it comes to surgery I think we can address both the trigger finger and the calcific  tendinitis in 1 sitting.  Nonetheless I think I would like for Dr. Burnetta to try either shockwave therapy or ultrasound-guided decompression of that calcium deposit.  We will give that about 3 weeks and plan on surgery in December if it is not successful.  Follow-Up Instructions: No follow-ups on file.   Orders:  No orders of the defined types were placed in this encounter.  No orders of the defined types were placed in this encounter.     Procedures: No procedures performed   Clinical Data: No additional findings.  Objective: Vital Signs: There were no vitals taken for this visit.  Physical Exam:  Constitutional: Patient appears well-developed HEENT:  Head: Normocephalic Eyes:EOM are normal Neck: Normal range of motion Cardiovascular: Normal rate Pulmonary/chest: Effort normal Neurologic: Patient is alert Skin: Skin is warm Psychiatric: Patient has normal mood and affect  Ortho Exam: Examination of the right shoulder demonstrates symmetric external rotation of 50 degrees abduction of 60 degrees.  Patient has passive abduction in both shoulders above 90 but when she actively does it it is more painful.  Forward flexion also limited on the right actively to about 100 degrees.  150 on the left.  Rotator cuff strength 5+ out of 5 to internal/external rotation on both sides.  No discrete  AC joint tenderness.  No definite paresthesias C5-T1.  Cervical spine range of motion intact.  Right ring finger does have tenderness at the A1 pulley with some triggering but not quite as bad as it was prior to her injection.  Specialty Comments:  No specialty comments available.  Imaging: No results found.   PMFS History: Patient Active Problem List   Diagnosis Date Noted   Anxiety 01/31/2024   Dyspnea 01/31/2024   Fatigue 01/31/2024   Gastro-esophageal reflux disease without esophagitis 01/31/2024   Heart failure with preserved ejection fraction (HCC) 01/31/2024   Low back pain  01/31/2024   Oxygen dependent 01/31/2024   Pure hypercholesterolemia 01/31/2024   Type 2 diabetes mellitus with other specified complication (HCC) 01/31/2024   Hyperlipidemia 10/24/2023   Elevated troponin 02/06/2022   Morbid obesity (HCC) 02/06/2022   Acute diastolic HF (heart failure) (HCC) 02/06/2022   Acute kidney injury    Acute respiratory failure with hypoxia (HCC)    Pneumonia due to COVID-19 virus 11/27/2021   Nuclear sclerosis of both eyes 03/16/2020   Essential hypertension 09/07/2018   Obstructive sleep apnea syndrome 09/07/2018   Vitamin D deficiency 09/07/2018   Arthritis of left acromioclavicular joint 04/13/2016   Past Medical History:  Diagnosis Date   Arthritis    Diabetes (HCC)     Family History  Problem Relation Age of Onset   Hypertension Mother    Hypertension Father    Breast cancer Neg Hx     Past Surgical History:  Procedure Laterality Date   LEFT HEART CATH AND CORONARY ANGIOGRAPHY N/A 09/29/2023   Procedure: LEFT HEART CATH AND CORONARY ANGIOGRAPHY;  Surgeon: Wonda Sharper, MD;  Location: [facility] INVASIVE CV LAB;  Service: Cardiovascular;  Laterality: N/A;   Social History   Occupational History   Not on file  Tobacco Use   Smoking status: Former   Smokeless tobacco: Never  Substance and Sexual Activity   Alcohol use: No   Drug use: No   Sexual activity: Not on file

## 2024-03-01 NOTE — Telephone Encounter (Signed)
 Called patient and scheduled for next available 30-minute appointment. She is on the wait list if any cancellations before that date.

## 2024-03-04 ENCOUNTER — Telehealth: Payer: Self-pay

## 2024-03-04 ENCOUNTER — Encounter: Payer: Self-pay | Admitting: Radiology

## 2024-03-04 NOTE — Telephone Encounter (Signed)
-----   Message from KANDICE Glendia Hutchinson sent at 03/03/2024 11:15 AM EST ----- Hi Sandeep Radell.  Can you call reset in about 3 weeks to see if her shoulder is any better.  If not we will keep her surgery date in December if it is better I want to get her off the schedule so we can put somebody else in there.  Thanks for

## 2024-03-04 NOTE — Telephone Encounter (Signed)
 Holding as reminder to follow up with patient per Dr Addie.

## 2024-03-04 NOTE — Telephone Encounter (Signed)
 duplicate

## 2024-03-14 ENCOUNTER — Encounter: Payer: Self-pay | Admitting: Sports Medicine

## 2024-03-14 ENCOUNTER — Ambulatory Visit: Admitting: Sports Medicine

## 2024-03-14 DIAGNOSIS — G8929 Other chronic pain: Secondary | ICD-10-CM | POA: Diagnosis not present

## 2024-03-14 DIAGNOSIS — M25511 Pain in right shoulder: Secondary | ICD-10-CM

## 2024-03-14 DIAGNOSIS — M7531 Calcific tendinitis of right shoulder: Secondary | ICD-10-CM

## 2024-03-14 NOTE — Progress Notes (Signed)
 Alejandra [name] - 63 y.o. female MRN [id number]  Date of birth: 1961-01-24  Office Visit Note: Visit Date: 03/14/2024 PCP: Leonel Cole, MD Referred by: Leonel Cole, MD  Subjective: Chief Complaint  Patient presents with   Right Shoulder - Pain   HPI: Alejandra [name] is a pleasant 63 y.o. female who presents today for acute on chronic right shoulder pain with known calcific tendinitis.  She has been having right shoulder pain for quite some time now, although it has been worse recently.  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  She has pain with raising the arm above her head and has significant pain as it lowers back down.  She has been seeing Dr. Addie and has had previous x-rays and multiple MRIs which have showed calcific deposition in the supraspinatus tendon, most recent did show that it has grown slightly from years past.  This is interfering with her ADLs and work.  She has tried over-the-counter medications without much relief.  She currently is taking combination diclofenac  50 mg-misoprostol  0.2 mg twice daily with mild relief.  She would like to discuss conservative treatment options, but is open to having a surgical procedure at the end of the year if this is not significantly improved.  Dr. Addie sent her over for consideration of shockwave therapy or consideration of ultrasound-guided calcific needle tenotomy/decompression.  Pertinent ROS were reviewed with the patient and found to be negative unless otherwise specified above in HPI.   Assessment & Plan: Visit Diagnoses:  1. Calcific tendinitis of right shoulder   2. Chronic right shoulder pain    Plan: Impression is acute on chronic right shoulder pain with radiographic evidence of calcific tendinitis within the supraspinatus tendon.  Her pain has significantly limited her ADLs/work and her range of motion.  At this juncture, I would like to trial 2 treatments of extracorporeal  shockwave therapy to see if we can begin breaking down the calcification and improving her pain and function.  We did perform this today, she will follow-up next week for a second trial and we will see at that point then what sort of cumulative benefit she has going forward.  If this is beneficial, she will then present for shockwave only visits.  If after 2 treatments she does not notice any difference, we will plan for ultrasound-guided needle tenotomy and calcific decompression/barbotage as indicated.  In the interim, she may continue her diclofenac  50 mg-misoprostol  0.2 mg twice daily.  May use ice/heat for any postprocedural pain.  Follow-up next week.  Follow-up: Return for next week for 2nd ECWT trial and shoulder f/u.   Meds & Orders: No orders of the defined types were placed in this encounter.  No orders of the defined types were placed in this encounter.    Procedures: Procedure: ECSWT Indications:  Calcific tendinitis   Procedure Details Consent: Risks of procedure as well as the alternatives and risks of each were explained to the patient.  Verbal consent for procedure obtained. Time Out: Verified patient identification, verified procedure, site was marked, verified correct patient position. The area was cleaned with alcohol swab.     The Right shoulder was targeted for Extracorporeal shockwave therapy.    Preset: Calcific Tendinitis Power Level: 110 mJ Frequency: 11-12 Hz Impulse/cycles: 3000 Head size: Regular   Patient tolerated procedure well without immediate complications.         Clinical History: No specialty comments available.  She  reports that she has quit smoking. She has never used smokeless tobacco. No results for input(s): HGBA1C, LABURIC in the last 8760 hours.  Objective:    Physical Exam  Gen: Well-appearing, in no acute distress; non-toxic CV: Well-perfused. Warm.  Resp: Breathing unlabored on room air; no wheezing. Psych: Fluid speech in  conversation; appropriate affect; normal thought process  Ortho Exam - Right shoulder: + TTP near Codman's point as well as the bicipital groove.  There is possibly a trace subacromial bursitis.  There is pain with active forward flexion from 95 degrees and above as well as 90 degrees of abduction.  There is notable drop arm test and empty can testing.  Mild discomfort with Hawkins impingement testing as well.  There is essentially full range of motion passively albeit with pain.  Imaging:  *I did independent review and interpret three-view right shoulder x-rays from 01/08/2024 in the visit today.  X-rays demonstrate a calcific lesion just superior to the greater tuberosity, likely reflective of the supraspinatus calcific tendinitis.  There is moderate AC joint arthritic change, no significant glenohumeral joint arthritic change.  No acute fracture noted.  Downsloping acromion noted.  *I did interpret and review the shoulder MRI during the visit today.  There is evidence of calcific tendinitis within the distal supraspinatus tendon just proximal to the footprint.  There is moderate AC joint arthropathy.  There is a downsloping acromion.  Narrative & Impression  CLINICAL DATA:  Severe shoulder pain with limited range of motion intermittently for 2 years.   EXAM: MR ARTHROGRAM OF THE RIGHT SHOULDER   TECHNIQUE: Multiplanar, multisequence MR imaging of the right shoulder was performed following the administration of intra-articular contrast.   CONTRAST:  See Injection Documentation.   COMPARISON:  Same day injection images. Radiographs 01/08/2024. Previous MR arthrogram 02/24/2021.   FINDINGS: Technical note: Despite efforts by the technologist and patient, mild motion artifact is present on today's exam and could not be eliminated. This reduces exam sensitivity and specificity.   Rotator cuff: In correlation with recent radiographs, there is chronic hydroxyapatite deposition distally in  the supraspinatus tendon consistent with calcific tendinitis. No evidence of rotator cuff tear.   Muscles:  No focal rotator cuff muscular atrophy or edema.   Biceps long head:  Intact and normally positioned.   Acromioclavicular Joint: The acromion is type 2. Stable mild-to-moderate acromioclavicular degenerative changes. Trace fluid but no contrast in the subacromial-subdeltoid bursa.   Glenohumeral Joint: The shoulder is well distended with contrast. Possible mild synovial thickening. No loose body or significant chondral defect identified.   Labrum: No evidence of labral tear or paralabral cyst.   Bones: No evidence of acute fracture or dislocation. Similar prominent subcortical cyst formation posteriorly in the humeral head near the infraspinatus insertion.   Other: No significant soft tissue findings.   IMPRESSION: 1. No acute findings or significant change from previous MR arthrogram. 2. Chronic supraspinatus calcific tendinitis. No evidence of rotator cuff tear. 3. The labrum and biceps tendon appear intact. 4. Stable mild-to-moderate acromioclavicular degenerative changes.     Electronically Signed   By: Elsie Perone M.D.   On: 02/17/2024 11:20    Past Medical/Family/Surgical/Social History: Medications & Allergies reviewed per EMR, new medications updated. Patient Active Problem List   Diagnosis Date Noted   Anxiety 01/31/2024   Dyspnea 01/31/2024   Fatigue 01/31/2024   Gastro-esophageal reflux disease without esophagitis 01/31/2024   Heart failure with preserved ejection fraction (HCC) 01/31/2024   Low back pain 01/31/2024  Oxygen dependent 01/31/2024   Pure hypercholesterolemia 01/31/2024   Type 2 diabetes mellitus with other specified complication (HCC) 01/31/2024   Hyperlipidemia 10/24/2023   Elevated troponin 02/06/2022   Morbid obesity (HCC) 02/06/2022   Acute diastolic HF (heart failure) (HCC) 02/06/2022   Acute kidney injury    Acute  respiratory failure with hypoxia (HCC)    Pneumonia due to COVID-19 virus 11/27/2021   Nuclear sclerosis of both eyes 03/16/2020   Essential hypertension 09/07/2018   Obstructive sleep apnea syndrome 09/07/2018   Vitamin D deficiency 09/07/2018   Arthritis of left acromioclavicular joint 04/13/2016   Past Medical History:  Diagnosis Date   Arthritis    Diabetes (HCC)    Family History  Problem Relation Age of Onset   Hypertension Mother    Hypertension Father    Breast cancer Neg Hx    Past Surgical History:  Procedure Laterality Date   LEFT HEART CATH AND CORONARY ANGIOGRAPHY N/A 09/29/2023   Procedure: LEFT HEART CATH AND CORONARY ANGIOGRAPHY;  Surgeon: Wonda Sharper, MD;  Location: [facility] INVASIVE CV LAB;  Service: Cardiovascular;  Laterality: N/A;   Social History   Occupational History   Not on file  Tobacco Use   Smoking status: Former   Smokeless tobacco: Never  Substance and Sexual Activity   Alcohol use: No   Drug use: No   Sexual activity: Not on file

## 2024-03-14 NOTE — Progress Notes (Signed)
 Patient says that she has had right shoulder pain for awhile that is worse after a day of work. She is unable to actively raise the arm without pain, and can do so with assistance, although lowering it back down is much more painful. She does not get much relief with anti-inflammatories. She does have surgery scheduled for 04/30/2024, although will cancel that surgery if conservative treatment is helpful.

## 2024-03-14 NOTE — Telephone Encounter (Signed)
 Hi Alejandra [name].  Can you get her set up to see Lonell so he can try needle aspiration under ultrasound and decompression of that calcium deposit sometime within the next 4 to 5 days.  I was hoping that she would begin before now.  Thanks

## 2024-03-21 ENCOUNTER — Ambulatory Visit: Admitting: Sports Medicine

## 2024-03-21 ENCOUNTER — Encounter: Payer: Self-pay | Admitting: Sports Medicine

## 2024-03-21 DIAGNOSIS — M25511 Pain in right shoulder: Secondary | ICD-10-CM | POA: Diagnosis not present

## 2024-03-21 DIAGNOSIS — G8929 Other chronic pain: Secondary | ICD-10-CM

## 2024-03-21 DIAGNOSIS — M7531 Calcific tendinitis of right shoulder: Secondary | ICD-10-CM | POA: Diagnosis not present

## 2024-03-21 NOTE — Progress Notes (Signed)
 Patient says that she feels about the same today as she did at her last visit. She was sore for 4-5 days after the first shockwave treatment, although she did not feel so sore that she needed ice or medicine to treat it. She denies any new pain or symptoms today. She is here for repeat shockwave therapy.

## 2024-03-21 NOTE — Progress Notes (Signed)
 Alejandra [name] - 63 y.o. female MRN [id number]  Date of birth: [birth year]  Office Visit Note: Visit Date: 03/21/2024 PCP: Leonel Cole, MD Referred by: Leonel Cole, MD  Subjective: Chief Complaint  Patient presents with   Right Shoulder - Follow-up   HPI: Alejandra [name] is a pleasant 63 y.o. female who presents today for f/u of chronic right shoulder calcific tendinitis.  She is feeling about the same in terms of right shoulder pain and function.  She did have a few days of soreness from the treatment but did not feel she needed ice or heat.  She continues on her combination diclofenac  50 mg-misoprostol  0.2 mg twice daily for pain control.  She did notice that the shockwave treatment today was not as painful.  Pertinent ROS were reviewed with the patient and found to be negative unless otherwise specified above in HPI.   Assessment & Plan: Visit Diagnoses:  1. Calcific tendinitis of right shoulder   2. Chronic right shoulder pain    Plan: Impression is chronic right shoulder pain with radiographic evidence of calcific tendinitis within the distal supraspinatus tendon.  We did perform our first trial of extracorporeal shockwave therapy last week and did move forward with her second treatment today.  She has not yet noticed a big difference in terms of pain control, but she was certainly less tender with current shockwave treatment today.  I would like to see over the next 1 week her degree of pain relief and function improvement from shockwave therapy.  If she is finding this beneficial, we will plan to set her up for additional shockwave visits.  If she does not notice much benefit after the best treatment, we could consider ultrasound-guided needle tenotomy/calcific decompression at that time.  We will reevaluate her at next week and make a decision on treatment.  In the interim, she may continue her diclofenac  50 mg-misoprostol  0.2 mg twice daily.  May use ice/heat for any postprocedural  pain.  Follow-up next week.   Meds & Orders: No orders of the defined types were placed in this encounter.  No orders of the defined types were placed in this encounter.    Procedures: Procedure: ECSWT Indications:  Calcific tendinitis   Procedure Details Consent: Risks of procedure as well as the alternatives and risks of each were explained to the patient.  Verbal consent for procedure obtained. Time Out: Verified patient identification, verified procedure, site was marked, verified correct patient position. The area was cleaned with alcohol swab.     The Right shoulder was targeted for Extracorporeal shockwave therapy.    Preset: Calcific Tendinitis Power Level: 110-120 mJ  Frequency: 12-13 Hz Impulse/cycles: 3200 Head size: Regular   Patient tolerated procedure well without immediate complications.      Clinical History: No specialty comments available.  She reports that she has quit smoking. She has never used smokeless tobacco. No results for input(s): HGBA1C, LABURIC in the last 8760 hours.  Objective:    Physical Exam  Gen: Well-appearing, in no acute distress; non-toxic CV: Well-perfused. Warm.  Resp: Breathing unlabored on room air; no wheezing. Psych: Fluid speech in conversation; appropriate affect; normal thought process  Ortho Exam - Right shoulder: + Mild TTP over the region of Codman's point/insertional supraspinatus region.  No redness swelling or effusion about the shoulder joint.  Imaging: No results found.  Past Medical/Family/Surgical/Social History: Medications & Allergies reviewed per EMR, new medications updated. Patient Active Problem List   Diagnosis Date Noted  Anxiety 01/31/2024   Dyspnea 01/31/2024   Fatigue 01/31/2024   Gastro-esophageal reflux disease without esophagitis 01/31/2024   Heart failure with preserved ejection fraction (HCC) 01/31/2024   Low back pain 01/31/2024   Oxygen dependent 01/31/2024   Pure  hypercholesterolemia 01/31/2024   Type 2 diabetes mellitus with other specified complication (HCC) 01/31/2024   Hyperlipidemia 10/24/2023   Elevated troponin 02/06/2022   Morbid obesity (HCC) 02/06/2022   Acute diastolic HF (heart failure) (HCC) 02/06/2022   Acute kidney injury    Acute respiratory failure with hypoxia (HCC)    Pneumonia due to COVID-19 virus 11/27/2021   Nuclear sclerosis of both eyes 03/16/2020   Essential hypertension 09/07/2018   Obstructive sleep apnea syndrome 09/07/2018   Vitamin D deficiency 09/07/2018   Arthritis of left acromioclavicular joint 04/13/2016   Past Medical History:  Diagnosis Date   Arthritis    Diabetes (HCC)    Family History  Problem Relation Age of Onset   Hypertension Mother    Hypertension Father    Breast cancer Neg Hx    Past Surgical History:  Procedure Laterality Date   LEFT HEART CATH AND CORONARY ANGIOGRAPHY N/A 09/29/2023   Procedure: LEFT HEART CATH AND CORONARY ANGIOGRAPHY;  Surgeon: Wonda Sharper, MD;  Location: [facility] INVASIVE CV LAB;  Service: Cardiovascular;  Laterality: N/A;   Social History   Occupational History   Not on file  Tobacco Use   Smoking status: Former   Smokeless tobacco: Never  Substance and Sexual Activity   Alcohol use: No   Drug use: No   Sexual activity: Not on file

## 2024-03-27 ENCOUNTER — Encounter: Payer: Self-pay | Admitting: Sports Medicine

## 2024-03-27 ENCOUNTER — Ambulatory Visit (INDEPENDENT_AMBULATORY_CARE_PROVIDER_SITE_OTHER): Admitting: Sports Medicine

## 2024-03-27 ENCOUNTER — Other Ambulatory Visit: Payer: Self-pay

## 2024-03-27 DIAGNOSIS — M25511 Pain in right shoulder: Secondary | ICD-10-CM

## 2024-03-27 DIAGNOSIS — M7531 Calcific tendinitis of right shoulder: Secondary | ICD-10-CM | POA: Diagnosis not present

## 2024-03-27 DIAGNOSIS — G8929 Other chronic pain: Secondary | ICD-10-CM | POA: Diagnosis not present

## 2024-03-27 MED ORDER — OXYCODONE-ACETAMINOPHEN 5-325 MG PO TABS
1.0000 | ORAL_TABLET | ORAL | 0 refills | Status: AC | PRN
Start: 2024-03-27 — End: ?

## 2024-03-27 NOTE — Progress Notes (Signed)
 Patient says that she felt less pain right after the treatment last time compared to the first time, but that night had severe pain in the shoulder and up to the right side of the neck. Since then, she feels that her pain is less consistent, but when it bothers her, her pain is much more intense. She is having more pain in the biceps than in the shoulder itself. She says that pushing and pulling her lightweight vacuum has been very difficult and painful, as well.

## 2024-03-27 NOTE — Progress Notes (Signed)
 Alejandra [name] - 63 y.o. female MRN [id number]  Date of birth: 02-28-1961  Office Visit Note: Visit Date: 03/27/2024 PCP: Leonel Cole, MD Referred by: Leonel Cole, MD  Subjective: Chief Complaint  Patient presents with   Right Shoulder - Follow-up   HPI: Alejandra [name] is a pleasant 63 y.o. female who presents today for follow-up of chronic right shoulder pain with calcific tendinitis.  So far we did perform 2 treatments of extracorporeal shockwave therapy.  She does feel after the last shockwave treatment as she feels less constant pain in the shoulder and less throbbing.  She still has motions where she reaches where she has intense sharp pain within the shoulder.  She is very bothered by repetitive activities such as vacuuming.  Her pain continues over the lateral and anterior aspect of the shoulder.  Pertinent ROS were reviewed with the patient and found to be negative unless otherwise specified above in HPI.   Assessment & Plan: Visit Diagnoses:  1. Calcific tendinitis of right shoulder   2. Chronic right shoulder pain    Plan: Impression is acute on chronic right shoulder pain with radiographic evidence of calcific tendinitis within the distal supraspinatus tendon.  She did receive some improvement from extracorporeal shockwave therapy after 2 trials, although not > 50% improvement.  Ideally would allow for a few additional treatment/time to see what sort of cumulative benefit she receives, but given the timeframe we will discontinue additional shockwave treatments currently.  Per her original plan, we did proceed with ultrasound-guided supraspinatus calcific tendon needle tenotomy and injection today.  Patient tolerated well.  Advised on postinjection protocol and activity modification.  Did discuss that she will be quite sore for the next week postprocedure. She may continue her diclofenac  50 mg-misoprostol  0.2 mg twice daily, but I did send in 10 tablets of Percocet 5-325 mg for  her to take for breakthrough pain only.  Also recommended icing the shoulder for 15-20 minutes 2-3 times daily for the next few days.  She will follow-up with Dr. Addie in about 2 weeks to see how she is doing and to move forward with next steps with her care.   Follow-up: Return in about 2 weeks (around 04/10/2024) for With Dr. Addie for calcific tendinitis shoulder.   Meds & Orders: No orders of the defined types were placed in this encounter.   Orders Placed This Encounter  Procedures   US  Guided Needle Placement - No Linked Charges     Procedures: US -Guided Supraspinatus Calcific Tendon Tenotomy and Injection, Right Shoulder  After discussion on risks/benefits/indications, informed verbal consent was obtained. A timeout was then performed. Patient was seated on table in exam room. The patient's shoulder was prepped with chloraprep and alcohol swabs and utilizing lateral approach with ultrasound guidance, the patient's supraspinatus tendon and the origin of the calcification was identified. The area was anesthesized first with 4 mL of lidocaine  1%. Following analgesia, using a 22-G, 1.5 needle under ultrasound guidance, the needle was inserted into the calcification of the supraspinatus and in-plane tenotomy was performed with mutliple needle fenestrations. The calcification was then subsequently injected with 2:2:2:1 mixture of lidocaine :bupivicaine:D50W:betamethasone via an in-plane approach. Patient tolerated the procedure well without immediate complications.            Clinical History: No specialty comments available.  She reports that she has quit smoking. She has never used smokeless tobacco. No results for input(s): HGBA1C, LABURIC in the last 8760 hours.  Objective:  Physical Exam  Gen: Well-appearing, in no acute distress; non-toxic CV: Well-perfused. Warm.  Resp: Breathing unlabored on room air; no wheezing. Psych: Fluid speech in conversation; appropriate affect;  normal thought process  Ortho Exam - Right shoulder: No swelling or effusion about the shoulder joint.  There is restriction in active range of motion in flexion and abduction, I am able to take her further passively but not complete range of motion of the contralateral shoulder.  Positive drop arm test, positive empty can test.  Positive Hawkins impingement.  There is pain with external rotation at the 90/90 position.  *Negative Spurling's test on the right today.  Imaging:  *US  image:    Narrative & Impression  CLINICAL DATA:  Severe shoulder pain with limited range of motion intermittently for 2 years.   EXAM: MR ARTHROGRAM OF THE RIGHT SHOULDER   TECHNIQUE: Multiplanar, multisequence MR imaging of the right shoulder was performed following the administration of intra-articular contrast.   CONTRAST:  See Injection Documentation.   COMPARISON:  Same day injection images. Radiographs 01/08/2024. Previous MR arthrogram 02/24/2021.   FINDINGS: Technical note: Despite efforts by the technologist and patient, mild motion artifact is present on today's exam and could not be eliminated. This reduces exam sensitivity and specificity.   Rotator cuff: In correlation with recent radiographs, there is chronic hydroxyapatite deposition distally in the supraspinatus tendon consistent with calcific tendinitis. No evidence of rotator cuff tear.   Muscles:  No focal rotator cuff muscular atrophy or edema.   Biceps long head:  Intact and normally positioned.   Acromioclavicular Joint: The acromion is type 2. Stable mild-to-moderate acromioclavicular degenerative changes. Trace fluid but no contrast in the subacromial-subdeltoid bursa.   Glenohumeral Joint: The shoulder is well distended with contrast. Possible mild synovial thickening. No loose body or significant chondral defect identified.   Labrum: No evidence of labral tear or paralabral cyst.   Bones: No evidence of acute  fracture or dislocation. Similar prominent subcortical cyst formation posteriorly in the humeral head near the infraspinatus insertion.   Other: No significant soft tissue findings.   IMPRESSION: 1. No acute findings or significant change from previous MR arthrogram. 2. Chronic supraspinatus calcific tendinitis. No evidence of rotator cuff tear. 3. The labrum and biceps tendon appear intact. 4. Stable mild-to-moderate acromioclavicular degenerative changes.     Electronically Signed   By: Elsie Perone M.D.   On: 02/17/2024 11:20    Past Medical/Family/Surgical/Social History: Medications & Allergies reviewed per EMR, new medications updated. Patient Active Problem List   Diagnosis Date Noted   Anxiety 01/31/2024   Dyspnea 01/31/2024   Fatigue 01/31/2024   Gastro-esophageal reflux disease without esophagitis 01/31/2024   Heart failure with preserved ejection fraction (HCC) 01/31/2024   Low back pain 01/31/2024   Oxygen dependent 01/31/2024   Pure hypercholesterolemia 01/31/2024   Type 2 diabetes mellitus with other specified complication (HCC) 01/31/2024   Hyperlipidemia 10/24/2023   Elevated troponin 02/06/2022   Morbid obesity (HCC) 02/06/2022   Acute diastolic HF (heart failure) (HCC) 02/06/2022   Acute kidney injury    Acute respiratory failure with hypoxia (HCC)    Pneumonia due to COVID-19 virus 11/27/2021   Nuclear sclerosis of both eyes 03/16/2020   Essential hypertension 09/07/2018   Obstructive sleep apnea syndrome 09/07/2018   Vitamin D deficiency 09/07/2018   Arthritis of left acromioclavicular joint 04/13/2016   Past Medical History:  Diagnosis Date   Arthritis    Diabetes [facility])    Family History  Problem Relation Age of Onset   Hypertension Mother    Hypertension Father    Breast cancer Neg Hx    Past Surgical History:  Procedure Laterality Date   LEFT HEART CATH AND CORONARY ANGIOGRAPHY N/A 09/29/2023   Procedure: LEFT HEART CATH AND  CORONARY ANGIOGRAPHY;  Surgeon: Wonda Sharper, MD;  Location: [facility] INVASIVE CV LAB;  Service: Cardiovascular;  Laterality: N/A;   Social History   Occupational History   Not on file  Tobacco Use   Smoking status: Former   Smokeless tobacco: Never  Substance and Sexual Activity   Alcohol use: No   Drug use: No   Sexual activity: Not on file

## 2024-04-17 ENCOUNTER — Ambulatory Visit: Admitting: Orthopedic Surgery

## 2024-04-17 DIAGNOSIS — G8929 Other chronic pain: Secondary | ICD-10-CM | POA: Diagnosis not present

## 2024-04-17 DIAGNOSIS — M25511 Pain in right shoulder: Secondary | ICD-10-CM | POA: Diagnosis not present

## 2024-04-17 DIAGNOSIS — M7531 Calcific tendinitis of right shoulder: Secondary | ICD-10-CM | POA: Diagnosis not present

## 2024-04-17 MED ORDER — OXYCODONE-ACETAMINOPHEN 5-325 MG PO TABS: ORAL_TABLET | ORAL | 0 refills | Status: AC

## 2024-04-17 MED ORDER — METHOCARBAMOL 500 MG PO TABS: 500.0000 mg | ORAL_TABLET | Freq: Three times a day (TID) | ORAL | 0 refills | Status: AC | PRN

## 2024-04-17 NOTE — Progress Notes (Signed)
 Surgical Instructions   Your procedure is scheduled on Tuesday, December 30th, 2025. Report to [facility] Main Entrance A at 1:15 P.M., then check in with the Admitting office. Any questions or running late day of surgery: call [phone]  Questions prior to your surgery date: call [phone], Monday-Friday, 8am-4pm. If you experience any cold or flu symptoms such as cough, fever, chills, shortness of breath, etc. between now and your scheduled surgery, please notify us  at the above number.     Remember:  Do not eat after midnight the night before your surgery   You may drink clear liquids until 12:15 P.M. the day of your surgery.   Clear liquids allowed are: Water, Non-Citrus Juices (without pulp), Carbonated Beverages, Clear Tea (no milk, honey, etc.), Black Coffee Only (NO MILK, CREAM OR POWDERED CREAMER of any kind), and Gatorade.    Take these medicines the morning of surgery with A SIP OF WATER: Misoprostol  (Cytotec ) Pantoprazole  (Protonix )   May take these medicines IF NEEDED: Albuterol  (Ventolin  HFA) inhaler - bring with you on day of surgery Alprazolam  (Xanax )    One week prior to surgery, STOP taking any Aspirin  (unless otherwise instructed by your surgeon) Aleve, Naproxen, Ibuprofen , Motrin , Advil , Goody's, BC's, all herbal medications, fish oil, and non-prescription vitamins.  This includes Diclofenac  (Cataflam ).    WHAT DO I DO ABOUT MY DIABETES MEDICATION?   Mounjaro should be stopped at least 7 days prior to your surgery.  Your last dose of Mounjaro should be on or before Monday, December 22nd.     HOW TO MANAGE YOUR DIABETES BEFORE AND AFTER SURGERY  Why is it important to control my blood sugar before and after surgery? Improving blood sugar levels before and after surgery helps healing and can limit problems. A way of improving blood sugar control is eating a healthy diet by:  Eating less sugar and carbohydrates  Increasing activity/exercise   Talking with your doctor about reaching your blood sugar goals High blood sugars (greater than 180 mg/dL) can raise your risk of infections and slow your recovery, so you will need to focus on controlling your diabetes during the weeks before surgery. Make sure that the doctor who takes care of your diabetes knows about your planned surgery including the date and location.  How do I manage my blood sugar before surgery? Check your blood sugar at least 4 times a day, starting 2 days before surgery, to make sure that the level is not too high or low.  Check your blood sugar the morning of your surgery when you wake up and every 2 hours until you get to the Short Stay unit.  If your blood sugar is less than 70 mg/dL, you will need to treat for low blood sugar: Do not take insulin . Treat a low blood sugar (less than 70 mg/dL) with  cup of clear juice (cranberry or apple), 4 glucose tablets, OR glucose gel. Recheck blood sugar in 15 minutes after treatment (to make sure it is greater than 70 mg/dL). If your blood sugar is not greater than 70 mg/dL on recheck, call [phone] for further instructions. Report your blood sugar to the short stay nurse when you get to Short Stay.  If you are admitted to the hospital after surgery: Your blood sugar will be checked by the staff and you will probably be given insulin  after surgery (instead of oral diabetes medicines) to make sure you have good blood sugar levels. The goal for blood sugar control after surgery  is 80-180 mg/dL.                      Do NOT Smoke (Tobacco/Vaping) for 24 hours prior to your procedure.  If you use a CPAP at night, you may bring your mask/headgear for your overnight stay.   You will be asked to remove any contacts, glasses, piercing's, hearing aid's, dentures/partials prior to surgery. Please bring cases for these items if needed.    Patients discharged the day of surgery will not be allowed to drive home, and someone  needs to stay with them for 24 hours.  SURGICAL WAITING ROOM VISITATION Patients may have no more than 2 support people in the waiting area - these visitors may rotate.   Pre-op nurse will coordinate an appropriate time for 1 ADULT support person, who may not rotate, to accompany patient in pre-op.  Children under the age of 58 must have an adult with them who is not the patient and must remain in the main waiting area with an adult.  If the patient needs to stay at the hospital during part of their recovery, the visitor guidelines for inpatient rooms apply.  Please refer to the [facility] website for the visitor guidelines for any additional information.   If you received a COVID test during your pre-op visit  it is requested that you wear a mask when out in public, stay away from anyone that may not be feeling well and notify your surgeon if you develop symptoms. If you have been in contact with anyone that has tested positive in the last 10 days please notify you surgeon.      Pre-operative CHG Bathing Instructions   You can play a key role in reducing the risk of infection after surgery. Your skin needs to be as free of germs as possible. You can reduce the number of germs on your skin by washing with CHG (chlorhexidine  gluconate) soap before surgery. CHG is an antiseptic soap that kills germs and continues to kill germs even after washing.   DO NOT use if you have an allergy to chlorhexidine /CHG or antibacterial soaps. If your skin becomes reddened or irritated, stop using the CHG and notify one of our RNs at [phone].              TAKE A SHOWER THE NIGHT BEFORE SURGERY   Please keep in mind the following:  DO NOT shave, including legs and underarms, 48 hours prior to surgery.   You may shave your face before/day of surgery.  Place clean sheets on your bed the night before surgery Use a clean washcloth (not used since being washed) for shower. DO NOT sleep with pet's night  before surgery.  CHG Shower Instructions:  Wash your face and private area with normal soap. If you choose to wash your hair, wash first with your normal shampoo.  After you use shampoo/soap, rinse your hair and body thoroughly to remove shampoo/soap residue.  Turn the water OFF and apply half the bottle of CHG soap to a CLEAN washcloth.  Apply CHG soap ONLY FROM YOUR NECK DOWN TO YOUR TOES (washing for 3-5 minutes)  DO NOT use CHG soap on face, private areas, open wounds, or sores.  Pay special attention to the area where your surgery is being performed.  If you are having back surgery, having someone wash your back for you may be helpful. Wait 2 minutes after CHG soap is applied, then you may rinse  off the CHG soap.  Pat dry with a clean towel  Put on clean pajamas    Additional instructions for the day of surgery: If you choose, you may shower the morning of surgery with an antibacterial soap.  DO NOT APPLY any lotions, deodorants, cologne, or perfumes.   Do not wear jewelry or makeup Do not wear nail polish, gel polish, artificial nails, or any other type of covering on natural nails (fingers and toes) Do not bring valuables to the hospital. [facility] is not responsible for valuables/personal belongings. Put on clean/comfortable clothes.  Please brush your teeth.  Ask your nurse before applying any prescription medications to the skin.

## 2024-04-18 ENCOUNTER — Other Ambulatory Visit: Payer: Self-pay

## 2024-04-18 ENCOUNTER — Encounter (HOSPITAL_COMMUNITY)
Admission: RE | Admit: 2024-04-18 | Discharge: 2024-04-18 | Disposition: A | Discharge status: Home / Self Care | Source: Ambulatory Visit | Attending: Orthopedic Surgery | Admitting: Orthopedic Surgery

## 2024-04-18 ENCOUNTER — Encounter: Payer: Self-pay | Admitting: Orthopedic Surgery

## 2024-04-18 ENCOUNTER — Encounter (HOSPITAL_COMMUNITY): Payer: Self-pay

## 2024-04-18 VITALS — BP 139/72 | HR 77 | Temp 98.2°F | Resp 18 | Ht 62.0 in | Wt 217.0 lb

## 2024-04-18 DIAGNOSIS — I110 Hypertensive heart disease with heart failure: Secondary | ICD-10-CM | POA: Insufficient documentation

## 2024-04-18 DIAGNOSIS — Z01812 Encounter for preprocedural laboratory examination: Secondary | ICD-10-CM | POA: Insufficient documentation

## 2024-04-18 DIAGNOSIS — G4733 Obstructive sleep apnea (adult) (pediatric): Secondary | ICD-10-CM | POA: Diagnosis not present

## 2024-04-18 DIAGNOSIS — Z7985 Long-term (current) use of injectable non-insulin antidiabetic drugs: Secondary | ICD-10-CM | POA: Diagnosis not present

## 2024-04-18 DIAGNOSIS — I5032 Chronic diastolic (congestive) heart failure: Secondary | ICD-10-CM | POA: Insufficient documentation

## 2024-04-18 DIAGNOSIS — I2510 Atherosclerotic heart disease of native coronary artery without angina pectoris: Secondary | ICD-10-CM | POA: Insufficient documentation

## 2024-04-18 DIAGNOSIS — E119 Type 2 diabetes mellitus without complications: Secondary | ICD-10-CM | POA: Insufficient documentation

## 2024-04-18 DIAGNOSIS — Z01818 Encounter for other preprocedural examination: Secondary | ICD-10-CM

## 2024-04-18 DIAGNOSIS — Z3A39 39 weeks gestation of pregnancy: Secondary | ICD-10-CM | POA: Insufficient documentation

## 2024-04-18 DIAGNOSIS — E66812 Obesity, class 2: Secondary | ICD-10-CM | POA: Insufficient documentation

## 2024-04-18 LAB — SURGICAL PCR SCREEN
MRSA, PCR: NEGATIVE
Staphylococcus aureus: POSITIVE — AB

## 2024-04-18 LAB — CBC
HCT: 47.9 % — ABNORMAL HIGH (ref 36.0–46.0)
Hemoglobin: 15.7 g/dL — ABNORMAL HIGH (ref 12.0–15.0)
MCH: 31.2 pg (ref 26.0–34.0)
MCHC: 32.8 g/dL (ref 30.0–36.0)
MCV: 95.2 fL (ref 80.0–100.0)
Platelets: 238 K/uL (ref 150–400)
RBC: 5.03 MIL/uL (ref 3.87–5.11)
RDW: 12.7 % (ref 11.5–15.5)
WBC: 7.8 K/uL (ref 4.0–10.5)
nRBC: 0 % (ref 0.0–0.2)

## 2024-04-18 LAB — BASIC METABOLIC PANEL WITH GFR
Anion gap: 11 (ref 5–15)
BUN: 9 mg/dL (ref 8–23)
CO2: 25 mmol/L (ref 22–32)
Calcium: 9.4 mg/dL (ref 8.9–10.3)
Chloride: 104 mmol/L (ref 98–111)
Creatinine, Ser: 0.62 mg/dL (ref 0.44–1.00)
GFR, Estimated: >60 mL/min (ref >60)
Glucose, Bld: 129 mg/dL — ABNORMAL HIGH (ref 70–99)
Potassium: 4.1 mmol/L (ref 3.5–5.1)
Sodium: 140 mmol/L (ref 135–145)

## 2024-04-18 LAB — HEMOGLOBIN A1C
Hgb A1c MFr Bld: 6.6 % — ABNORMAL HIGH (ref 4.8–5.6)
Mean Plasma Glucose: 142.72 mg/dL

## 2024-04-18 LAB — GLUCOSE, CAPILLARY: Glucose-Capillary: 143 mg/dL — ABNORMAL HIGH (ref 70–99)

## 2024-04-18 MED ORDER — MISOPROSTOL 100 MCG PO TABS: 100.0000 ug | ORAL_TABLET | Freq: Two times a day (BID) | ORAL | 0 refills | Status: AC

## 2024-04-18 MED ORDER — DICLOFENAC POTASSIUM 50 MG PO TABS: 50.0000 mg | ORAL_TABLET | Freq: Two times a day (BID) | ORAL | 0 refills | Status: AC

## 2024-04-18 NOTE — Progress Notes (Signed)
 Office Visit Note   Patient: Alejandra [name]           Date of Birth: 02/03/1961           MRN: [id number] Visit Date: 04/17/2024 Requested by: Leonel Cole, MD [address] [place],  [place] [address] PCP: Leonel Cole, MD  Subjective: Chief Complaint  Patient presents with   Right Shoulder - Follow-up    HPI: Alejandra [name] is a 64 y.o. female who presents to the office reporting continued right shoulder pain.  Patient underwent calcific tendinitis ultrasound-guided debridement on 1126.  That gave her some relief but not enough for her to not want to continue with surgical intervention.  States that extension hurts on that right-hand side.  Most of her pain is in the deltoid.  Hard for her to pull her self up into the car.  Abduction and reaching is difficult.  She has had about 3 episodes of neck pain since we last have seen her.  She was to go back to work within 3 weeks of her shoulder procedure.  Feels like the arm is a little bit weaker..                ROS: All systems reviewed are negative as they relate to the chief complaint within the history of present illness.  Patient denies fevers or chills.  Assessment & Plan: Visit Diagnoses:  1. Calcific tendinitis of right shoulder   2. Chronic right shoulder pain     Plan: Impression is symptomatic calcific tendinitis in the right shoulder.  Plan is arthroscopy and debridement.  Biceps tendon will need to be examined at that time as well for possible contribution to her pain symptoms.  Percocet and Robaxin  prescribed today to be used postoperatively.  Physical therapy here 1-2 times before surgery for range of motion and strengthening exercises.  We will also refill her diclofenac .  Risk and benefits of surgery are discussed with the patient including not limited to infection nerve or vessel damage incomplete pain relief as well as incomplete restoration of function.  Small chance that we would have to do something more  than debridement of that calcium deposit depending on the status of the rotator cuff as well as the status of the biceps tendon.  I do not think that her [facility] joint is symptomatic at this time.  Follow-Up Instructions: No follow-ups on file.   Orders:  Orders Placed This Encounter  Procedures   Ambulatory referral to Physical Therapy   Meds ordered this encounter  Medications   methocarbamol  (ROBAXIN ) 500 MG tablet    Sig: Take 1 tablet (500 mg total) by mouth every 8 (eight) hours as needed for muscle spasms.    Dispense:  30 tablet    Refill:  0   oxyCODONE -acetaminophen  (PERCOCET/ROXICET) 5-325 MG tablet    Sig: 1 po q 8 hrs prn pain--post op pain only    Dispense:  35 tablet    Refill:  0      Procedures: No procedures performed   Clinical Data: No additional findings.  Objective: Vital Signs: There were no vitals taken for this visit.  Physical Exam:  Constitutional: Patient appears well-developed HEENT:  Head: Normocephalic Eyes:EOM are normal Neck: Normal range of motion Cardiovascular: Normal rate Pulmonary/chest: Effort normal Neurologic: Patient is alert Skin: Skin is warm Psychiatric: Patient has normal mood and affect  Ortho Exam: Ortho exam demonstrates bilateral shoulder range of motion of 60/95/165.  No discrete AC joint tenderness right versus left.  No masses lymphadenopathy or skin changes noted in the right shoulder girdle region.  Does have some pain with resisted abduction.  Subscap strength is intact 5+ out of 5.  External rotation strength is intact also 5 out of 5.  Does have some pain with internal and external rotation of that right arm at 90 degrees of abduction but no crepitus.  Specialty Comments:  No specialty comments available.  Imaging: No results found.   PMFS History: Patient Active Problem List   Diagnosis Date Noted   Anxiety 01/31/2024   Dyspnea 01/31/2024   Fatigue 01/31/2024   Gastro-esophageal reflux disease without  esophagitis 01/31/2024   Heart failure with preserved ejection fraction (HCC) 01/31/2024   Low back pain 01/31/2024   Oxygen dependent 01/31/2024   Pure hypercholesterolemia 01/31/2024   Type 2 diabetes mellitus with other specified complication (HCC) 01/31/2024   Hyperlipidemia 10/24/2023   Elevated troponin 02/06/2022   Morbid obesity (HCC) 02/06/2022   Acute diastolic HF (heart failure) (HCC) 02/06/2022   Acute kidney injury    Acute respiratory failure with hypoxia (HCC)    Pneumonia due to COVID-19 virus 11/27/2021   Nuclear sclerosis of both eyes 03/16/2020   Essential hypertension 09/07/2018   Obstructive sleep apnea syndrome 09/07/2018   Vitamin D deficiency 09/07/2018   Arthritis of left acromioclavicular joint 04/13/2016   Past Medical History:  Diagnosis Date   Anxiety    Arthritis    Diabetes (HCC)    Hypertension    Pneumonia    in 2023   Sleep apnea     Family History  Problem Relation Age of Onset   Hypertension Mother    Hypertension Father    Breast cancer Neg Hx     Past Surgical History:  Procedure Laterality Date   ABDOMINAL HYSTERECTOMY     CHOLECYSTECTOMY     LEFT HEART CATH AND CORONARY ANGIOGRAPHY N/A 09/29/2023   Procedure: LEFT HEART CATH AND CORONARY ANGIOGRAPHY;  Surgeon: Wonda Sharper, MD;  Location: [facility] INVASIVE CV LAB;  Service: Cardiovascular;  Laterality: N/A;   Social History   Occupational History   Not on file  Tobacco Use   Smoking status: Former   Smokeless tobacco: Never  Substance and Sexual Activity   Alcohol use: No   Drug use: No   Sexual activity: Not on file

## 2024-04-18 NOTE — Progress Notes (Signed)
 PCP - ELI HAMMER  Cardiologist - Lavona Agent, MD   PPM/ICD - denies Device Orders - n/a Rep Notified - n/a  Chest x-ray -  EKG - 10-16-23 Stress Test -  ECHO - 02-26-24 Cardiac Cath - 09-29-23  Sleep Study - 10+ years ago CPAP - unable to tolerate  Fasting Blood Sugar - Per patient blood sugar ranges between 140-189. Blood sugar at PAT appointment 143 Checks Blood Sugar BID  Last dose of GLP1 agonist-  MOUNJARO  GLP1 instructions: Last dose 04-19-24 ( takes on Friday)  Blood Thinner Instructions:denies Aspirin  Instructions: per patient she reports she has not taken recently. Did instructed to follow up with surgeon.  ERAS Protcol - clear liquids until 12:15  COVID TEST- n/a   Anesthesia review: Yes, Hx of DM, HTN, OSA  Patient denies shortness of breath, fever, cough and chest pain at PAT appointment   All instructions explained to the patient, with a verbal understanding of the material. Patient agrees to go over the instructions while at home for a better understanding. Patient also instructed to self quarantine after being tested for COVID-19. The opportunity to ask questions was provided.

## 2024-04-18 NOTE — Addendum Note (Signed)
 Addended by: ADDIE HACKER on: 04/18/2024 09:41 AM   Modules accepted: Orders

## 2024-04-19 NOTE — Progress Notes (Signed)
 Anesthesia Chart Review:  63 year old female with pertinent history including non-insulin -dependent DM2, HLD, HTN, HFpEF, OSA intolerant to CPAP, class II obesity BMI 39.  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  Medical therapy recommended.  Recent echo 02/26/2024 showed LVEF 60 to 65%, normal wall motion, normal RV, no significant valvular abnormalities.  She reports last dose of Mounjaro 04/19/2024.  Preop labs reviewed, unremarkable.  DM2 well-controlled with A1c 6.6.  EKG 10/16/2023: NSR.  Rate 64.  TTE 02/26/2024: 1. Left ventricular ejection fraction, by estimation, is 60 to 65%. The  left ventricle has normal function. The left ventricle has no regional  wall motion abnormalities. Left ventricular diastolic parameters were  normal.   2. Right ventricular systolic function is normal. The right ventricular  size is normal. Tricuspid regurgitation signal is inadequate for assessing  PA pressure.   3. The mitral valve is grossly normal. Trivial mitral valve  regurgitation. No evidence of mitral stenosis.   4. The aortic valve is tricuspid. Aortic valve regurgitation is trivial.  No aortic stenosis is present.   5. Increased flow velocities may be secondary to anemia, thyrotoxicosis,  hyperdynamic or high flow state.   6. The inferior vena cava is normal in size with greater than 50%  respiratory variability, suggesting right atrial pressure of 3 mmHg.    Cath 09/29/2023: 1.  Mild nonobstructive coronary artery disease with 40% mid LAD stenosis, minimal nonobstructive plaquing in the RCA and circumflex 2.  Normal LVEDP   Recommendations: Patient with nonobstructive CAD.  Recommend medical therapy.    Lynwood Geofm RIGGERS [facility]/Anesthesiology Phone [phone] 04/19/2024 4:32  PM

## 2024-04-19 NOTE — Anesthesia Preprocedure Evaluation (Signed)
"                                    Anesthesia Evaluation    Airway        Dental   Pulmonary former smoker          Cardiovascular hypertension,      Neuro/Psych    GI/Hepatic   Endo/Other  diabetes    Renal/GU      Musculoskeletal   Abdominal   Peds  Hematology   Anesthesia Other Findings   Reproductive/Obstetrics                              Anesthesia Physical Anesthesia Plan  ASA:   Anesthesia Plan:    Post-op Pain Management:    Induction:   PONV Risk Score and Plan:   Airway Management Planned:   Additional Equipment:   Intra-op Plan:   Post-operative Plan:   Informed Consent:   Plan Discussed with:   Anesthesia Plan Comments: (PAT note by Lynwood Hope, PA-C: 63 year old female with pertinent history including non-insulin -dependent DM2, HLD, HTN, HFpEF, OSA intolerant to CPAP, class II obesity BMI 39.  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  Medical therapy recommended.  Recent echo 02/26/2024 showed LVEF 60 to 65%, normal wall motion, normal RV, no significant valvular abnormalities.  She reports last dose of Mounjaro 04/19/2024.  Preop labs reviewed, unremarkable.  DM2 well-controlled with A1c 6.6.  EKG 10/16/2023: NSR.  Rate 64.  TTE 02/26/2024: 1. Left ventricular ejection fraction, by estimation, is 60 to 65%. The  left ventricle has normal function. The left ventricle has no regional  wall motion abnormalities. Left ventricular diastolic parameters were  normal.   2. Right ventricular systolic function is normal. The right ventricular  size is normal. Tricuspid regurgitation signal is inadequate for assessing  PA pressure.   3. The mitral valve is grossly normal. Trivial mitral valve  regurgitation. No evidence  of mitral stenosis.   4. The aortic valve is tricuspid. Aortic valve regurgitation is trivial.  No aortic stenosis is present.   5. Increased flow velocities may be secondary to anemia, thyrotoxicosis,  hyperdynamic or high flow state.   6. The inferior vena cava is normal in size with greater than 50%  respiratory variability, suggesting right atrial pressure of 3 mmHg.    Cath 09/29/2023: 1.  Mild nonobstructive coronary artery disease with 40% mid LAD stenosis, minimal nonobstructive plaquing in the RCA and circumflex 2.  Normal LVEDP   Recommendations: Patient with nonobstructive CAD.  Recommend medical therapy.    )         Anesthesia Quick Evaluation  "

## 2024-04-22 ENCOUNTER — Encounter: Payer: Self-pay | Admitting: Physical Therapy

## 2024-04-22 ENCOUNTER — Telehealth: Payer: Self-pay | Admitting: Cardiology

## 2024-04-22 ENCOUNTER — Ambulatory Visit (INDEPENDENT_AMBULATORY_CARE_PROVIDER_SITE_OTHER): Admitting: Physical Therapy

## 2024-04-22 DIAGNOSIS — M6281 Muscle weakness (generalized): Secondary | ICD-10-CM | POA: Diagnosis not present

## 2024-04-22 DIAGNOSIS — G8929 Other chronic pain: Secondary | ICD-10-CM

## 2024-04-22 DIAGNOSIS — M25511 Pain in right shoulder: Secondary | ICD-10-CM | POA: Diagnosis not present

## 2024-04-22 MED ORDER — REPATHA SURECLICK 140 MG/ML ~~LOC~~ SOAJ: 1.0000 mL | SUBCUTANEOUS | 3 refills | Status: AC

## 2024-04-22 NOTE — Telephone Encounter (Signed)
 Repatha  sent to patient pharmacy. Patient will have to request from her pharmacy how many months she will like but this will likely be dictated by what her prescription insurance will cover. We will be happy to run a new prior authorization for Repatha  in January

## 2024-04-22 NOTE — Telephone Encounter (Signed)
 Spoke with pt regarding a refill for Repatha . Pt stated she had to renew her insurance starting 1/1 and her doctors and medications are no longer going to be covered. Pt stated she will begin Medicare in May and then her doctors and medications will then be covered. Pt is hoping to get 6 months worth of Repatha  to hold her over until May. Pt was told her request would be sent to our pharmacists for their review and assistance. Pt verbalized understanding. All questions if any were answered.

## 2024-04-22 NOTE — Therapy (Signed)
 " OUTPATIENT PHYSICAL THERAPY SHOULDER EVALUATION   Patient Name: Alejandra [name] MRN: [id number] DOB:February 12, 1961, 63 y.o., female Today's Date: 04/22/2024  END OF SESSION:  PT End of Session - 04/22/24 1117     Visit Number 1    Number of Visits 2    Date for Recertification  05/23/24    PT Start Time 1100    PT Stop Time 1140    PT Time Calculation (min) 40 min    Activity Tolerance Patient tolerated treatment well    Behavior During Therapy [facility] for tasks assessed/performed          Past Medical History:  Diagnosis Date   Anxiety    Arthritis    Diabetes (HCC)    Hypertension    Pneumonia    in 2023   Sleep apnea    Past Surgical History:  Procedure Laterality Date   ABDOMINAL HYSTERECTOMY     CHOLECYSTECTOMY     LEFT HEART CATH AND CORONARY ANGIOGRAPHY N/A 09/29/2023   Procedure: LEFT HEART CATH AND CORONARY ANGIOGRAPHY;  Surgeon: Wonda Sharper, MD;  Location: [facility] INVASIVE CV LAB;  Service: Cardiovascular;  Laterality: N/A;   Patient Active Problem List   Diagnosis Date Noted   Anxiety 01/31/2024   Dyspnea 01/31/2024   Fatigue 01/31/2024   Gastro-esophageal reflux disease without esophagitis 01/31/2024   Heart failure with preserved ejection fraction (HCC) 01/31/2024   Low back pain 01/31/2024   Oxygen dependent 01/31/2024   Pure hypercholesterolemia 01/31/2024   Type 2 diabetes mellitus with other specified complication (HCC) 01/31/2024   Hyperlipidemia 10/24/2023   Elevated troponin 02/06/2022   Morbid obesity (HCC) 02/06/2022   Acute diastolic HF (heart failure) (HCC) 02/06/2022   Acute kidney injury    Acute respiratory failure with hypoxia (HCC)    Pneumonia due to COVID-19 virus 11/27/2021   Nuclear sclerosis of both eyes 03/16/2020   Essential hypertension 09/07/2018   Obstructive sleep apnea syndrome 09/07/2018   Vitamin D deficiency 09/07/2018   Arthritis of left acromioclavicular joint 04/13/2016    PCP: Leonel Cole, MD   REFERRING  PROVIDER: [name], MD   REFERRING DIAG:  M75.31 (ICD-10-CM) - Calcific tendinitis of right shoulder  M25.511,G89.29 (ICD-10-CM) - Chronic right shoulder pain    THERAPY DIAG:  Chronic right shoulder pain  Muscle weakness (generalized)  Rationale for Evaluation and Treatment: Rehabilitation  ONSET DATE: years  SUBJECTIVE:                                                                                                                                                                                      SUBJECTIVE STATEMENT: Pt reporting she has  been doing shock therapy with Dr. Burnetta which has helped some. Pt reporting 2/10 pain at rest. Pain increases to 8-9/10 with reaching or movements.   PERTINENT HISTORY: Pt is scheduled for arthroscopic surgery with subacrominal decompression and tendonitis debridement on 04/30/24.   PAIN:  NPRS scale: 06/11/08 Pain location: Rt bicep shoulder Pain description: achy, can be throbbing and stabbing at times Aggravating factors: reaching movement Relieving factors: resting, ibuprofen    PRECAUTIONS: None  WEIGHT BEARING RESTRICTIONS: No  FALLS:  Has patient fallen in last 6 months? No  LIVING ENVIRONMENT: Lives with: lives with their family and lives with their spouse Lives in: House/apartment Stairs: Yes: External: 1 steps; none Has following equipment at home: None  OCCUPATION: Hair dresser  PLOF: Independent  PATIENT GOALS:get exercises before surgery   Next MD visit:   OBJECTIVE:   DIAGNOSTIC FINDINGS: 02/14/24 IMPRESSION: 1. No acute findings or significant change from previous MR arthrogram. 2. Chronic supraspinatus calcific tendinitis. No evidence of rotator cuff tear. 3. The labrum and biceps tendon appear intact. 4. Stable mild-to-moderate acromioclavicular degenerative changes.  PATIENT SURVEYS:  Patient-Specific Activity Scoring Scheme  0 represents unable to perform. 10 represents able  to perform at prior level. 0 1 2 3 4 5 6 7 8 9  10 (Date and Score)   Activity Eval  04/22/24    1. Pulling self into car with Rt UE 1     2. Fastening/unfastening bra 3     3. Range of motion in performing my job all day 3   4.lifting heavy weights in my rt arm 2   5. Sleeping without pain 3   Score 2.4    Total score = sum of the activity scores/number of activities Minimum detectable change (90%CI) for average score = 2 points Minimum detectable change (90%CI) for single activity score = 3 points  COGNITION: Overall cognitive status: WFL     SENSATION: WFL  POSTURE: Forward head and shoulders  UPPER EXTREMITY ROM:   ROM Right Eval 04/22/24   Shoulder flexion A: 140 c pain beginning at 95 deg   Shoulder extension    Shoulder abduction A: 145, pain around bicep   Shoulder adduction    Shoulder internal rotation A: thumb to superior sacrum   Shoulder external rotation A: 65 c mild pain in anterior shoulder   Elbow flexion    Elbow extension    Wrist flexion    Wrist extension    Wrist ulnar deviation    Wrist radial deviation    Wrist pronation    Wrist supination    (Blank rows = not tested)  UPPER EXTREMITY MMT:  MMT Right Eval 04/22/24 Sitting HHD in pounds Left Eval 04/22/24 Sitting HHD in pounds  Shoulder flexion 11.9 14.9  Shoulder extension    Shoulder abduction 5.2 12.6  Shoulder adduction    Shoulder internal rotation    Shoulder external rotation    Middle trapezius    Lower trapezius    Elbow flexion 15.0 24.6  Elbow extension    Wrist flexion    Wrist extension    Wrist ulnar deviation    Wrist radial deviation    Wrist pronation    Wrist supination    Grip strength (lbs)    (Blank rows = not tested)  SHOULDER SPECIAL TESTS: Impingement tests: Hawkins/Kennedy impingement test: positive   PALPATION:  TTP: Rt long head bicep tendon, and muscle belly  Anterior Rt shoulder  TODAY'S TREATMENT:                                                                                                       DATE: 04/22/24 Therex: HEP instruction/performance c cues for techniques, handout provided.  Trial set performed of each for comprehension and symptom assessment.  See below for exercise list Self care:  Pt edu in sleeping positions and movement compensation as needed for ADL's   PATIENT EDUCATION: Education details: HEP, POC Person educated: Patient Education method: Explanation, Demonstration, Verbal cues, and Handouts Education comprehension: verbalized understanding, returned demonstration, and verbal cues required  HOME EXERCISE PROGRAM: Access Code: [email or web address] URL: [email or web address] Date: 04/22/2024 Prepared by: Delon Lunger  Exercises - Standing Row with Anchored Resistance  - 2 x daily - 7 x weekly - 2 sets - 10 reps - 3 seconds hold - Seated Bilateral Shoulder Flexion Towel Slide at Table Top  - 2 x daily - 7 x weekly - 2 sets - 10 reps - Seated Shoulder Scaption Slide at Table Top with Forearm in Neutral  - 2 x daily - 7 x weekly - 2 sets - 10 reps - Corner Pec Major Stretch  - 2 x daily - 7 x weekly - 3 reps - 10 seconds hold - Isometric Shoulder Extension at Wall  - 1 x daily - 7 x weekly - 3 sets - 10 reps - Isometric Shoulder Flexion at Wall  - 1 x daily - 7 x weekly - 3 sets - 10 reps - 5 seconds hold - Standing Isometric Shoulder Internal Rotation at Doorway  - 2 x daily - 7 x weekly - 10 reps - 5 seconds hold - Standing Isometric Shoulder External Rotation with Doorway  - 2 x daily - 7 x weekly - 10 reps - 5 seconds hold  ASSESSMENT:  CLINICAL IMPRESSION: Patient is a 63 y.o. who comes to clinic with complaints of Rt shoulder pain with mobility, strength and movement coordination deficits  that impair their ability to perform usual daily and recreational functional activities without increase difficulty/symptoms at this time.  Patient reporting she was here for a one time PT visit prior to having surgery scheduled next week on 04/30/24 and wanted exercises to help prepare her both before  and after surgery. Pt was instructed to wait for MD recommendations for exercises to perform following surgery. Pt's was issued a HEP with app and video instructions.   OBJECTIVE IMPAIRMENTS: decreased ROM, decreased strength, impaired UE functional use, postural dysfunction, and pain.   ACTIVITY LIMITATIONS: lifting, sleeping, bathing, dressing, reach over head, and hygiene/grooming  PARTICIPATION LIMITATIONS: driving, community activity, and occupation  PERSONAL FACTORS: see PMH above are also affecting patient's functional outcome.   REHAB POTENTIAL: Good  CLINICAL DECISION MAKING: Stable/uncomplicated  EVALUATION COMPLEXITY: Low   GOALS: Goals reviewed with patient? Yes  SHORT TERM GOALS: (target date for Short term goals 05/01/24)  1.Patient will demonstrate independent use of home exercise program to maintain progress from in clinic treatments. Goal status: Issued at evaluation and pt able to return demonstration 04/22/24  PLAN:  PT FREQUENCY: 1-2 if pt feels she needs further follow up  PT DURATION: 2 weeks  PLANNED INTERVENTIONS: Can include 02853- PT Re-evaluation, 97110-Therapeutic exercises, 97530- Therapeutic activity, [id number]- Neuromuscular re-education, 97535- Self Care, 97140- Manual therapy, [phone]- Gait training, [phone]- Orthotic Fit/training, [id number]- Canalith repositioning, [id number]- Aquatic Therapy, [phone]- Electrical stimulation (unattended), [id number] Physical performance testing, 97016- Vasopneumatic device, [id number]- Ultrasound, [id number]- Traction (mechanical), [id number]- Ionotophoresis 4mg /ml Dexamethasone ,  [id number] - Needle insertion w/o injection 1 or 2 muscles, 20561 - Needle  insertion w/o injection 3 or more muscles.   Patient/Family education, Balance training, Stair training, Taping, Dry Needling, Joint mobilization, Joint manipulation, Spinal manipulation, Spinal mobilization, Scar mobilization, Vestibular training, Visual/preceptual remediation/compensation, DME instructions, Cryotherapy, and Moist heat.  All performed as medically necessary.  All included unless contraindicated  PLAN FOR NEXT SESSION: Review HEP knowledge/results. Progress if needed with ROM and strengthening until 04/30/24 surgery      Delon JONELLE Lunger, PT, MPT 04/22/2024, 5:23 PM  "

## 2024-04-22 NOTE — Telephone Encounter (Signed)
 Pt would like a c/b from Dr Lavona nurse to discuss a insurance issue with medication and Dr visits. Please advise

## 2024-04-23 ENCOUNTER — Ambulatory Visit
Admission: RE | Admit: 2024-04-23 | Discharge: 2024-04-23 | Disposition: A | Discharge status: Home / Self Care | Source: Ambulatory Visit | Attending: Family Medicine | Admitting: Family Medicine

## 2024-04-23 ENCOUNTER — Other Ambulatory Visit: Payer: Self-pay | Admitting: Family Medicine

## 2024-04-23 ENCOUNTER — Encounter: Payer: Self-pay | Admitting: Orthopedic Surgery

## 2024-04-23 DIAGNOSIS — R0789 Other chest pain: Secondary | ICD-10-CM

## 2024-04-23 NOTE — Telephone Encounter (Signed)
 Spoke with pt and let her know that an 84 day supply of Repatha  was sent in with 3 refills. Pt was grateful. Pt verbalized understanding. All questions if any were answered.

## 2024-04-26 ENCOUNTER — Telehealth: Payer: Self-pay | Admitting: Orthopedic Surgery

## 2024-04-26 NOTE — Telephone Encounter (Signed)
 Pt states she got a letter on 04/24/24  from her insurance company stating that her surgery for 04/30/24 was denied. Pt request a call back ASAP.

## 2024-04-26 NOTE — Telephone Encounter (Signed)
 Hi Alejandra [name].  You can see the note I sent back [place].  If she is reporting chest pain with physical therapy then it is likely they may not even want to put her to sleep until she has had some type of medical evaluation.  Also it sounds like she is getting better anyway so I says she should consider postponing the surgery I know there are insurance issues at play here but I think without a medical evaluation with what she is describing they may not do anesthesia for elective surgery.

## 2024-04-29 NOTE — Telephone Encounter (Signed)
 Surgery has been postponed at this time. See mychart message.

## 2024-04-30 ENCOUNTER — Encounter (HOSPITAL_COMMUNITY): Payer: Self-pay | Admitting: Physician Assistant

## 2024-04-30 ENCOUNTER — Ambulatory Visit (HOSPITAL_COMMUNITY): Admission: RE | Admit: 2024-04-30 | Source: Home / Self Care | Admitting: Orthopedic Surgery

## 2024-04-30 ENCOUNTER — Encounter (HOSPITAL_COMMUNITY): Admission: RE | Payer: Self-pay | Source: Home / Self Care

## 2024-04-30 DIAGNOSIS — Z01818 Encounter for other preprocedural examination: Secondary | ICD-10-CM

## 2024-04-30 SURGERY — ARTHROSCOPY, SHOULDER
Anesthesia: General | Site: Shoulder | Laterality: Right

## 2024-05-08 ENCOUNTER — Encounter: Admitting: Orthopedic Surgery
